# Patient Record
Sex: Female | Born: 1980 | Race: Black or African American | Hispanic: No | Smoking: Former smoker
Health system: Southern US, Community
[De-identification: ages and names within clinical notes are randomized; demographics above are authoritative.]

## PROBLEM LIST (undated history)

## (undated) DIAGNOSIS — H9190 Unspecified hearing loss, unspecified ear: Secondary | ICD-10-CM

## (undated) DIAGNOSIS — J45909 Unspecified asthma, uncomplicated: Secondary | ICD-10-CM

## (undated) DIAGNOSIS — E119 Type 2 diabetes mellitus without complications: Secondary | ICD-10-CM

## (undated) DIAGNOSIS — I10 Essential (primary) hypertension: Secondary | ICD-10-CM

---

## 2005-04-18 DIAGNOSIS — O1410 Severe pre-eclampsia, unspecified trimester: Secondary | ICD-10-CM

## 2005-04-18 DIAGNOSIS — O321XX0 Maternal care for breech presentation, not applicable or unspecified: Secondary | ICD-10-CM

## 2018-07-11 ENCOUNTER — Emergency Department (HOSPITAL_COMMUNITY): Payer: Medicaid Other

## 2018-07-11 ENCOUNTER — Emergency Department (HOSPITAL_COMMUNITY)
Admission: EM | Admit: 2018-07-11 | Discharge: 2018-07-11 | Disposition: A | Discharge status: Home / Self Care | Payer: Medicaid Other | Attending: Emergency Medicine | Admitting: Emergency Medicine

## 2018-07-11 ENCOUNTER — Other Ambulatory Visit: Payer: Self-pay

## 2018-07-11 ENCOUNTER — Encounter (HOSPITAL_COMMUNITY): Payer: Self-pay

## 2018-07-11 DIAGNOSIS — I10 Essential (primary) hypertension: Secondary | ICD-10-CM | POA: Insufficient documentation

## 2018-07-11 DIAGNOSIS — F17210 Nicotine dependence, cigarettes, uncomplicated: Secondary | ICD-10-CM | POA: Diagnosis not present

## 2018-07-11 DIAGNOSIS — N3001 Acute cystitis with hematuria: Secondary | ICD-10-CM | POA: Diagnosis not present

## 2018-07-11 DIAGNOSIS — F1290 Cannabis use, unspecified, uncomplicated: Secondary | ICD-10-CM | POA: Insufficient documentation

## 2018-07-11 DIAGNOSIS — R1030 Lower abdominal pain, unspecified: Secondary | ICD-10-CM | POA: Insufficient documentation

## 2018-07-11 DIAGNOSIS — M545 Low back pain: Secondary | ICD-10-CM | POA: Diagnosis present

## 2018-07-11 DIAGNOSIS — Z8709 Personal history of other diseases of the respiratory system: Secondary | ICD-10-CM | POA: Insufficient documentation

## 2018-07-11 DIAGNOSIS — E119 Type 2 diabetes mellitus without complications: Secondary | ICD-10-CM | POA: Diagnosis not present

## 2018-07-11 HISTORY — DX: Unspecified asthma, uncomplicated: J45.909

## 2018-07-11 HISTORY — DX: Type 2 diabetes mellitus without complications: E11.9

## 2018-07-11 HISTORY — DX: Essential (primary) hypertension: I10

## 2018-07-11 LAB — URINALYSIS, ROUTINE W REFLEX MICROSCOPIC
Bilirubin Urine: NEGATIVE
Glucose, UA: >=500 mg/dL — AB
Ketones, ur: 5 mg/dL — AB
Nitrite: NEGATIVE
Protein, ur: 100 mg/dL — AB
Specific Gravity, Urine: 1.024 (ref 1.005–1.030)
pH: 6 (ref 5.0–8.0)

## 2018-07-11 LAB — COMPREHENSIVE METABOLIC PANEL
ALT: 30 U/L (ref 0–44)
AST: 41 U/L (ref 15–41)
Albumin: 3.8 g/dL (ref 3.5–5.0)
Alkaline Phosphatase: 62 U/L (ref 38–126)
Anion gap: 8 (ref 5–15)
BUN: 12 mg/dL (ref 6–20)
CO2: 22 mmol/L (ref 22–32)
Calcium: 9.1 mg/dL (ref 8.9–10.3)
Chloride: 107 mmol/L (ref 98–111)
Creatinine, Ser: 0.72 mg/dL (ref 0.44–1.00)
GFR calc Af Amer: >60 mL/min (ref >60)
GFR calc non Af Amer: >60 mL/min (ref >60)
GLUCOSE: 192 mg/dL — AB (ref 70–99)
Potassium: 4.3 mmol/L (ref 3.5–5.1)
Sodium: 137 mmol/L (ref 135–145)
Total Bilirubin: 1.1 mg/dL (ref 0.3–1.2)
Total Protein: 7.6 g/dL (ref 6.5–8.1)

## 2018-07-11 LAB — CBC
HEMATOCRIT: 43.8 % (ref 36.0–46.0)
HEMOGLOBIN: 13.8 g/dL (ref 12.0–15.0)
MCH: 26.8 pg (ref 26.0–34.0)
MCHC: 31.5 g/dL (ref 30.0–36.0)
MCV: 85.2 fL (ref 80.0–100.0)
Platelets: 226 10*3/uL (ref 150–400)
RBC: 5.14 MIL/uL — AB (ref 3.87–5.11)
RDW: 13.3 % (ref 11.5–15.5)
WBC: 9.5 10*3/uL (ref 4.0–10.5)
nRBC: 0 % (ref 0.0–0.2)

## 2018-07-11 LAB — PREGNANCY, URINE: Preg Test, Ur: NEGATIVE

## 2018-07-11 MED ORDER — CEPHALEXIN 500 MG PO CAPS: 500.0000 mg | ORAL_CAPSULE | Freq: Two times a day (BID) | ORAL | 0 refills | Status: DC

## 2018-07-11 MED ORDER — ONDANSETRON 8 MG PO TBDP: 8.0000 mg | ORAL_TABLET | Freq: Three times a day (TID) | ORAL | 0 refills | Status: DC | PRN

## 2018-07-11 MED ORDER — OXYCODONE-ACETAMINOPHEN 5-325 MG PO TABS
1.0000 | ORAL_TABLET | Freq: Once | ORAL | Status: AC
  Administered 2018-07-11: 1 via ORAL
  Filled 2018-07-11: qty 1

## 2018-07-11 MED ORDER — MORPHINE SULFATE (PF) 4 MG/ML IV SOLN
4.0000 mg | Freq: Once | INTRAVENOUS | Status: AC
  Administered 2018-07-11: 4 mg via INTRAVENOUS
  Filled 2018-07-11: qty 1

## 2018-07-11 MED ORDER — IOHEXOL 300 MG/ML  SOLN
100.0000 mL | Freq: Once | INTRAMUSCULAR | Status: AC | PRN
  Administered 2018-07-11: 100 mL via INTRAVENOUS

## 2018-07-11 MED ORDER — SODIUM CHLORIDE 0.9 % IV SOLN
1.0000 g | Freq: Once | INTRAVENOUS | Status: AC
  Administered 2018-07-11: 1 g via INTRAVENOUS
  Filled 2018-07-11: qty 10

## 2018-07-11 NOTE — ED Triage Notes (Signed)
Pt presents with low back pain that radiates to the front abd, that started Monday, pt reports the pain as constant.   Pt reports dizzy spells and is unable to check her sugar due to lack of home glucometer.

## 2018-07-12 LAB — URINE CULTURE

## 2018-07-13 NOTE — ED Provider Notes (Signed)
[facility] EMERGENCY DEPARTMENT Provider Note   CSN: [id number] Arrival date & time: 07/11/18  7672    History   Chief Complaint Chief Complaint  Patient presents with  . Back Pain    HPI Jane Schultz is a 38 y.o. female.     HPI Patient is a 38 year old female who presents the emergency department complaints of back pain radiating around to the front of her abdomen.  No urinary complaints.  Denies nausea vomiting diarrhea.  History of diabetes and asthma and hypertension.  No recent sick contacts.  Denies upper abdominal pain.  No new rash.  Symptoms are constant and moderate in severity.     Past Medical History:  Diagnosis Date  . Asthma   . Diabetes mellitus without complication ([place])   . Hypertension     There are no active problems to display for this patient.   History reviewed. No pertinent surgical history.   OB History   No obstetric history on file.      Home Medications    Prior to Admission medications   Medication Sig Start Date End Date Taking? Authorizing Provider  cephALEXin (KEFLEX) 500 MG capsule Take 1 capsule (500 mg total) by mouth 2 (two) times daily. 07/11/18   Jola Schmidt, MD  ondansetron (ZOFRAN ODT) 8 MG disintegrating tablet Take 1 tablet (8 mg total) by mouth every 8 (eight) hours as needed for nausea or vomiting. 07/11/18   Jola Schmidt, MD    Family History No family history on file.  Social History Social History   Tobacco Use  . Smoking status: Current Every Day Smoker    Packs/day: 2.00    Types: Cigarettes  . Smokeless tobacco: Never Used  Substance Use Topics  . Alcohol use: Never    Frequency: Never  . Drug use: Yes    Types: Marijuana     Allergies   Patient has no known allergies.   Review of Systems Review of Systems  All other systems reviewed and are negative.    Physical Exam Updated Vital Signs BP (!) 150/92   Pulse 74   Temp 98.4 F (36.9 C) (Oral)    Resp 16   Ht 5\' 7"  (1.702 m)   Wt 113.4 kg   SpO2 100%   BMI 39.16 kg/m   Physical Exam Vitals signs and nursing note reviewed.  Constitutional:      General: She is not in acute distress.    Appearance: She is well-developed.  HENT:     Head: Normocephalic and atraumatic.  Neck:     Musculoskeletal: Normal range of motion.  Cardiovascular:     Rate and Rhythm: Normal rate and regular rhythm.     Heart sounds: Normal heart sounds.  Pulmonary:     Effort: Pulmonary effort is normal.     Breath sounds: Normal breath sounds.  Abdominal:     General: There is no distension.     Palpations: Abdomen is soft.     Tenderness: There is no abdominal tenderness.  Musculoskeletal: Normal range of motion.  Skin:    General: Skin is warm and dry.  Neurological:     Mental Status: She is alert and oriented to person, place, and time.  Psychiatric:        Judgment: Judgment normal.      ED Treatments / Results  Labs (all labs ordered are listed, but only abnormal results are displayed) Labs Reviewed  URINE CULTURE - Abnormal; Notable for the  following components:      Result Value        All other components within normal limits  CBC - Abnormal; Notable for the following components:   RBC 5.14 (*)    All other components within normal limits  COMPREHENSIVE METABOLIC PANEL - Abnormal; Notable for the following components:   Glucose, Bld 192 (*)    All other components within normal limits  URINALYSIS, ROUTINE W REFLEX MICROSCOPIC - Abnormal; Notable for the following components:   APPearance CLOUDY (*)    Glucose, UA >=500 (*)    Hgb urine dipstick SMALL (*)    Ketones, ur 5 (*)    Protein, ur 100 (*)    Leukocytes,Ua TRACE (*)    Bacteria, UA RARE (*)    All other components within normal limits  PREGNANCY, URINE    EKG None  Radiology  CT abdomen pelvis  iMPRESSION: 1. Mild urinary bladder wall thickening. Question a degree of cystitis. Correlation with  urinalysis in this regard advised.  2. Mild adrenal hypertrophy bilaterally, a finding of questionable clinical significance.  3. No evident renal or ureteral calculus. No hydronephrosis on either side.  4. No evident bowel obstruction. No abscess in the abdomen or pelvis. Appendix appears normal.  5.  Gallbladder absent.  Procedures Procedures (including critical care time)  Medications Ordered in ED Medications  morphine 4 MG/ML injection 4 mg (4 mg Intravenous Given 07/11/18 1049)  iohexol (OMNIPAQUE) 300 MG/ML solution 100 mL (100 mLs Intravenous Contrast Given 07/11/18 1204)  cefTRIAXone (ROCEPHIN) 1 g in sodium chloride 0.9 % 100 mL IVPB (0 g Intravenous Stopped 07/11/18 1350)  oxyCODONE-acetaminophen (PERCOCET/ROXICET) 5-325 MG per tablet 1 tablet (1 tablet Oral Given 07/11/18 1337)     Initial Impression / Assessment and Plan / ED Course  I have reviewed the triage vital signs and the nursing notes.  Pertinent labs & imaging results that were available during my care of the patient were reviewed by me and considered in my medical decision making (see chart for details).       Patient is overall well-appearing.  CT scan is reassuring.  No indication for additional work-up at this time.  Primary care follow-up.    Final Clinical Impressions(s) / ED Diagnoses   Final diagnoses:  Acute cystitis with hematuria    ED Discharge Orders         Ordered    cephALEXin (KEFLEX) 500 MG capsule  2 times daily     07/11/18 1403    ondansetron (ZOFRAN ODT) 8 MG disintegrating tablet  Every 8 hours PRN     07/11/18 1403           Jola Schmidt, MD 07/13/18 2305

## 2019-01-15 ENCOUNTER — Other Ambulatory Visit: Payer: Self-pay

## 2019-01-15 ENCOUNTER — Encounter: Payer: Self-pay | Admitting: Emergency Medicine

## 2019-01-15 DIAGNOSIS — R102 Pelvic and perineal pain: Secondary | ICD-10-CM | POA: Diagnosis not present

## 2019-01-15 DIAGNOSIS — D259 Leiomyoma of uterus, unspecified: Secondary | ICD-10-CM | POA: Diagnosis not present

## 2019-01-15 DIAGNOSIS — I10 Essential (primary) hypertension: Secondary | ICD-10-CM | POA: Insufficient documentation

## 2019-01-15 DIAGNOSIS — N83201 Unspecified ovarian cyst, right side: Secondary | ICD-10-CM | POA: Insufficient documentation

## 2019-01-15 DIAGNOSIS — N760 Acute vaginitis: Secondary | ICD-10-CM | POA: Diagnosis not present

## 2019-01-15 DIAGNOSIS — Y929 Unspecified place or not applicable: Secondary | ICD-10-CM | POA: Insufficient documentation

## 2019-01-15 DIAGNOSIS — F1210 Cannabis abuse, uncomplicated: Secondary | ICD-10-CM | POA: Diagnosis not present

## 2019-01-15 DIAGNOSIS — S93401A Sprain of unspecified ligament of right ankle, initial encounter: Secondary | ICD-10-CM | POA: Insufficient documentation

## 2019-01-15 DIAGNOSIS — F17210 Nicotine dependence, cigarettes, uncomplicated: Secondary | ICD-10-CM | POA: Diagnosis not present

## 2019-01-15 DIAGNOSIS — J45909 Unspecified asthma, uncomplicated: Secondary | ICD-10-CM | POA: Diagnosis not present

## 2019-01-15 DIAGNOSIS — Y33XXXA Other specified events, undetermined intent, initial encounter: Secondary | ICD-10-CM | POA: Diagnosis not present

## 2019-01-15 DIAGNOSIS — Y939 Activity, unspecified: Secondary | ICD-10-CM | POA: Insufficient documentation

## 2019-01-15 DIAGNOSIS — E119 Type 2 diabetes mellitus without complications: Secondary | ICD-10-CM | POA: Diagnosis not present

## 2019-01-15 DIAGNOSIS — Z79899 Other long term (current) drug therapy: Secondary | ICD-10-CM | POA: Diagnosis not present

## 2019-01-15 DIAGNOSIS — Y999 Unspecified external cause status: Secondary | ICD-10-CM | POA: Insufficient documentation

## 2019-01-15 DIAGNOSIS — B9689 Other specified bacterial agents as the cause of diseases classified elsewhere: Secondary | ICD-10-CM | POA: Insufficient documentation

## 2019-01-15 LAB — URINALYSIS, COMPLETE (UACMP) WITH MICROSCOPIC
Bilirubin Urine: NEGATIVE
Glucose, UA: >=500 mg/dL — AB
Hgb urine dipstick: NEGATIVE
Ketones, ur: 5 mg/dL — AB
Leukocytes,Ua: NEGATIVE
Nitrite: NEGATIVE
Protein, ur: 30 mg/dL — AB
Specific Gravity, Urine: 1.035 — ABNORMAL HIGH (ref 1.005–1.030)
pH: 5 (ref 5.0–8.0)

## 2019-01-15 LAB — CBC
HCT: 40.5 % (ref 36.0–46.0)
Hemoglobin: 13.6 g/dL (ref 12.0–15.0)
MCH: 27.3 pg (ref 26.0–34.0)
MCHC: 33.6 g/dL (ref 30.0–36.0)
MCV: 81.2 fL (ref 80.0–100.0)
Platelets: 248 10*3/uL (ref 150–400)
RBC: 4.99 MIL/uL (ref 3.87–5.11)
RDW: 14.7 % (ref 11.5–15.5)
WBC: 10.4 10*3/uL (ref 4.0–10.5)
nRBC: 0 % (ref 0.0–0.2)

## 2019-01-15 LAB — POCT PREGNANCY, URINE: Preg Test, Ur: NEGATIVE

## 2019-01-15 MED ORDER — ONDANSETRON HCL 4 MG/2ML IJ SOLN
4.0000 mg | Freq: Once | INTRAMUSCULAR | Status: AC | PRN
  Administered 2019-01-15: 22:00:00 4 mg via INTRAVENOUS
  Filled 2019-01-15: qty 2

## 2019-01-15 MED ORDER — FENTANYL CITRATE (PF) 100 MCG/2ML IJ SOLN
50.0000 ug | INTRAMUSCULAR | Status: AC | PRN
  Administered 2019-01-15 – 2019-01-16 (×2): 50 ug via INTRAVENOUS
  Filled 2019-01-15 (×2): qty 2

## 2019-01-15 NOTE — ED Triage Notes (Addendum)
Patient to ER for c/o bilateral pelvic pain that radiates to back x2-3 hours. Denies any dysuria or vaginal discharge. Patient does report foul odor to urine, as well as abscess to labia.  Patient also c/o right thigh pain and swelling.

## 2019-01-16 ENCOUNTER — Emergency Department: Payer: Medicaid Other

## 2019-01-16 ENCOUNTER — Emergency Department
Admission: EM | Admit: 2019-01-16 | Discharge: 2019-01-16 | Disposition: A | Discharge status: Home / Self Care | Payer: Medicaid Other | Attending: Emergency Medicine | Admitting: Emergency Medicine

## 2019-01-16 DIAGNOSIS — S93401A Sprain of unspecified ligament of right ankle, initial encounter: Secondary | ICD-10-CM

## 2019-01-16 DIAGNOSIS — R739 Hyperglycemia, unspecified: Secondary | ICD-10-CM

## 2019-01-16 DIAGNOSIS — R102 Pelvic and perineal pain: Secondary | ICD-10-CM

## 2019-01-16 DIAGNOSIS — N83201 Unspecified ovarian cyst, right side: Secondary | ICD-10-CM

## 2019-01-16 DIAGNOSIS — B9689 Other specified bacterial agents as the cause of diseases classified elsewhere: Secondary | ICD-10-CM

## 2019-01-16 DIAGNOSIS — N760 Acute vaginitis: Secondary | ICD-10-CM

## 2019-01-16 DIAGNOSIS — R52 Pain, unspecified: Secondary | ICD-10-CM

## 2019-01-16 DIAGNOSIS — D259 Leiomyoma of uterus, unspecified: Secondary | ICD-10-CM

## 2019-01-16 LAB — LIPASE, BLOOD: Lipase: 26 U/L (ref 11–51)

## 2019-01-16 LAB — COMPREHENSIVE METABOLIC PANEL
ALT: 29 U/L (ref 0–44)
AST: 22 U/L (ref 15–41)
Albumin: 3.7 g/dL (ref 3.5–5.0)
Alkaline Phosphatase: 67 U/L (ref 38–126)
Anion gap: 11 (ref 5–15)
BUN: 9 mg/dL (ref 6–20)
CO2: 24 mmol/L (ref 22–32)
Calcium: 8.5 mg/dL — ABNORMAL LOW (ref 8.9–10.3)
Chloride: 100 mmol/L (ref 98–111)
Creatinine, Ser: 0.66 mg/dL (ref 0.44–1.00)
GFR calc Af Amer: >60 mL/min (ref >60)
GFR calc non Af Amer: >60 mL/min (ref >60)
Glucose, Bld: 265 mg/dL — ABNORMAL HIGH (ref 70–99)
Potassium: 4.2 mmol/L (ref 3.5–5.1)
Sodium: 135 mmol/L (ref 135–145)
Total Bilirubin: 0.7 mg/dL (ref 0.3–1.2)
Total Protein: 7.3 g/dL (ref 6.5–8.1)

## 2019-01-16 LAB — WET PREP, GENITAL
Sperm: NONE SEEN
Trich, Wet Prep: NONE SEEN
Yeast Wet Prep HPF POC: NONE SEEN

## 2019-01-16 MED ORDER — CLINDAMYCIN HCL 300 MG PO CAPS: 300.0000 mg | ORAL_CAPSULE | Freq: Three times a day (TID) | ORAL | 0 refills | Status: DC

## 2019-01-16 MED ORDER — FENTANYL CITRATE (PF) 100 MCG/2ML IJ SOLN
50.0000 ug | Freq: Once | INTRAMUSCULAR | Status: AC
  Administered 2019-01-16: 01:00:00 50 ug via INTRAVENOUS
  Filled 2019-01-16: qty 2

## 2019-01-16 MED ORDER — METRONIDAZOLE 500 MG PO TABS: 500.0000 mg | ORAL_TABLET | Freq: Two times a day (BID) | ORAL | 0 refills | Status: DC

## 2019-01-16 MED ORDER — METRONIDAZOLE 500 MG PO TABS
500.0000 mg | ORAL_TABLET | Freq: Once | ORAL | Status: AC
  Administered 2019-01-16: 05:00:00 500 mg via ORAL
  Filled 2019-01-16: qty 1

## 2019-01-16 MED ORDER — HYDROCODONE-ACETAMINOPHEN 5-325 MG PO TABS: 1.0000 | ORAL_TABLET | Freq: Four times a day (QID) | ORAL | 0 refills | Status: DC | PRN

## 2019-01-16 MED ORDER — CLINDAMYCIN HCL 150 MG PO CAPS
300.0000 mg | ORAL_CAPSULE | Freq: Once | ORAL | Status: AC
  Administered 2019-01-16: 300 mg via ORAL
  Filled 2019-01-16: qty 2

## 2019-01-16 MED ORDER — ONDANSETRON HCL 4 MG/2ML IJ SOLN
4.0000 mg | Freq: Once | INTRAMUSCULAR | Status: AC
  Administered 2019-01-16: 01:00:00 4 mg via INTRAVENOUS
  Filled 2019-01-16: qty 2

## 2019-01-16 NOTE — ED Provider Notes (Signed)
[facility] Emergency Department Provider Note   ____________________________________________   First MD Initiated Contact with Patient 01/16/19 0012     (approximate)  I have reviewed the triage vital signs and the nursing notes.   HISTORY  Chief Complaint Pelvic Pain  History obtained via [employer] ASL interpreter  HPI Jane Schultz is a 38 y.o. female brought to the ED from home via EMS with a chief complaint of pelvic pain.  Patient reports bilateral pelvic pain radiating to her back for the past 2 to 3 hours prior to arrival.  Denies fever, cough, chest pain, shortness of breath, nausea, vomiting, dysuria, vaginal discharge.  Reports painful knot to her right labia.  Also notes right foot pain and swelling.  Fell 2 days ago.       Past Medical History:  Diagnosis Date   Asthma    Diabetes mellitus without complication ([place])    Hypertension     There are no active problems to display for this patient.   Past Surgical History:  Procedure Laterality Date   CESAREAN SECTION      Prior to Admission medications   Medication Sig Start Date End Date Taking? Authorizing Provider  cephALEXin (KEFLEX) 500 MG capsule Take 1 capsule (500 mg total) by mouth 2 (two) times daily. 07/11/18   Jola Schmidt, MD  clindamycin (CLEOCIN) 300 MG capsule Take 1 capsule (300 mg total) by mouth 3 (three) times daily. 01/16/19   Paulette Blanch, MD  HYDROcodone-acetaminophen (NORCO) 5-325 MG tablet Take 1 tablet by mouth every 6 (six) hours as needed for moderate pain. 01/16/19   Paulette Blanch, MD  metroNIDAZOLE (FLAGYL) 500 MG tablet Take 1 tablet (500 mg total) by mouth 2 (two) times daily. 01/16/19   Paulette Blanch, MD  ondansetron (ZOFRAN ODT) 8 MG disintegrating tablet Take 1 tablet (8 mg total) by mouth every 8 (eight) hours as needed for nausea or vomiting. 07/11/18   Jola Schmidt, MD    Allergies Patient has no known allergies.  No family history on  file.  Social History Social History   Tobacco Use   Smoking status: Current Every Day Smoker    Packs/day: 2.00    Types: Cigarettes   Smokeless tobacco: Never Used  Substance Use Topics   Alcohol use: Never    Frequency: Never   Drug use: Yes    Types: Marijuana    Review of Systems  Constitutional: No fever/chills Eyes: No visual changes. ENT: No sore throat. Cardiovascular: Denies chest pain. Respiratory: Denies shortness of breath. Gastrointestinal: Positive for pelvic pain.  No abdominal pain.  No nausea, no vomiting.  No diarrhea.  No constipation. Genitourinary: Positive for dysuria. Musculoskeletal: Negative for back pain. Skin: Negative for rash. Neurological: Negative for headaches, focal weakness or numbness.   ____________________________________________   PHYSICAL EXAM:  VITAL SIGNS: ED Triage Vitals  Enc Vitals Group     BP 01/15/19 2229 (!) 120/59     Pulse Rate 01/15/19 2229 87     Resp 01/15/19 2229 20     Temp 01/15/19 2229 98.8 F (37.1 C)     Temp Source 01/15/19 2229 Oral     SpO2 01/15/19 2229 97 %     Weight 01/15/19 2153 260 lb (117.9 kg)     Height 01/15/19 2153 5\' 7"  (1.702 m)     Head Circumference --      Peak Flow --      Pain Score 01/15/19 2152 10  Pain Loc --      Pain Edu? --      Excl. in [place]? --     Constitutional: Alert and oriented. Well appearing and in no acute distress. Eyes: Conjunctivae are normal. PERRL. EOMI. Head: Atraumatic. Nose: No congestion/rhinnorhea. Mouth/Throat: Mucous membranes are moist.  Oropharynx non-erythematous. Neck: No stridor.   Cardiovascular: Normal rate, regular rhythm. Grossly normal heart sounds.  Good peripheral circulation. Respiratory: Normal respiratory effort.  No retractions. Lungs CTAB. Gastrointestinal: Soft and minimally tender to palpation pelvis without rebound or guarding. No distention. No abdominal bruits. No CVA tenderness. Genitourinary: Tiny painful area to  right outer labia without erythema or fluctuance. Musculoskeletal: No lower extremity tenderness nor edema.  No joint effusions. Neurologic:  Normal speech and language. No gross focal neurologic deficits are appreciated. No gait instability. Skin:  Skin is warm, dry and intact. No rash noted. Psychiatric: Mood and affect are normal. Speech and behavior are normal.  ____________________________________________   LABS (all labs ordered are listed, but only abnormal results are displayed)  Labs Reviewed  WET PREP, GENITAL - Abnormal; Notable for the following components:      Result Value   Clue Cells Wet Prep HPF POC FEW (*)    WBC, Wet Prep HPF POC FEW (*)    All other components within normal limits  URINALYSIS, COMPLETE (UACMP) WITH MICROSCOPIC - Abnormal; Notable for the following components:   Color, Urine YELLOW (*)    APPearance CLEAR (*)    Specific Gravity, Urine 1.035 (*)    Glucose, UA >=500 (*)    Ketones, ur 5 (*)    Protein, ur 30 (*)    Bacteria, UA RARE (*)    All other components within normal limits  COMPREHENSIVE METABOLIC PANEL - Abnormal; Notable for the following components:   Glucose, Bld 265 (*)    Calcium 8.5 (*)    All other components within normal limits  GC/CHLAMYDIA PROBE AMP  CBC  LIPASE, BLOOD  POC URINE PREG, ED  POCT PREGNANCY, URINE   ____________________________________________  EKG  None ____________________________________________  RADIOLOGY  ED MD interpretation: No osseous injury to right foot; right ovarian cyst, uterine fibroid  Official radiology report(s): Dg Foot Complete Right  Result Date: 01/16/2019 CLINICAL DATA:  Right foot pain. Swelling. EXAM: RIGHT FOOT COMPLETE - 3+ VIEW COMPARISON:  None. FINDINGS: There is no evidence of fracture or dislocation. Well corticated osseous density at the first tarsal metatarsal joint is likely an accessory ossicle or result of remote prior injury. No osseous erosions or bony  destructive change. Tiny Achilles tendon enthesophyte. Mild tibial talar spurring. Generalized soft tissue edema overlies the dorsum of the foot. IMPRESSION: 1. Soft tissue edema over the dorsum of the foot. No acute osseous abnormality. 2. Well corticated osseous density at the first tarsometatarsal joint is likely an accessory ossicle or result of remote injury. Electronically Signed   By: Keith Rake M.D.   On: 01/16/2019 00:59   US Pelvic Complete W Transvaginal And Torsion R/o  Result Date: 01/16/2019 CLINICAL DATA:  Bilateral pelvic pain with radiation to the patient is back for several days, negative UPT, irregular menses EXAM: TRANSABDOMINAL AND TRANSVAGINAL ULTRASOUND OF PELVIS DOPPLER ULTRASOUND OF OVARIES TECHNIQUE: Both transabdominal and transvaginal ultrasound examinations of the pelvis were performed. Transabdominal technique was performed for global imaging of the pelvis including uterus, ovaries, adnexal regions, and pelvic cul-de-sac. It was necessary to proceed with endovaginal exam following the transabdominal exam to visualize the endometrium and  ovaries. Color and duplex Doppler ultrasound was utilized to evaluate blood flow to the ovaries. COMPARISON:  CT abdomen pelvis 07/11/2018 FINDINGS: Uterus Measurements: 8.1 x 5.0 x 5.1 cm = volume: 106.8 mL. There is a hypoechoic fibroid present along the posterior uterine fundus measuring 2.1 x 1.6 x 1.9 cm. No other significant uterine lesions or abnormality is visualized. Endometrium Thickness: 2.5 mm, within normal limits. No focal abnormality visualized. Right ovary Measurements: 4.7 x 3.1 x 3.4 cm = volume: 25.5 mL. Normal appearance of the right ovarian stroma. There is a large anechoic cyst measuring 4.3 x 2.7 x 3.6 cm in size. Background Left ovary Measurements: 2.7 x 1.8 x 2.0 cm = volume: 5 mL. Normal appearance. No adnexal mass. Pulsed Doppler evaluation of both ovaries demonstrates normal low-resistance arterial and venous  waveforms. Other findings Trace anechoic free fluid noted in the pelvis. IMPRESSION: 4.3 cm anechoic right ovarian cyst. This has benign characteristics and is a common finding in premenopausal females. No imaging follow up is required. This follows consensus guidelines: Simple Adnexal Cysts: SRU Consensus Conference Update on Follow-up and Reporting. Radiology 2019; [id number]. 2.1 cm hypoechoic posterior fundal fibroid. Otherwise unremarkable pelvic ultrasound. Electronically Signed   By: Lovena Le M.D.   On: 01/16/2019 02:48    ____________________________________________   PROCEDURES  Procedure(s) performed (including Critical Care):  Procedures   ____________________________________________   INITIAL IMPRESSION / ASSESSMENT AND PLAN / ED COURSE  As part of my medical decision making, I reviewed the following data within the [place] notes reviewed and incorporated, Interpreter needed, Labs reviewed, Old chart reviewed, Radiograph reviewed, Notes from prior ED visits and [place] Controlled Substance Database     Jane Schultz was evaluated in Emergency Department on 01/16/2019 for the symptoms described in the history of present illness. She was evaluated in the context of the global COVID-19 pandemic, which necessitated consideration that the patient might be at risk for infection with the SARS-CoV-2 virus that causes COVID-19. Institutional protocols and algorithms that pertain to the evaluation of patients at risk for COVID-19 are in a state of rapid change based on information released by regulatory bodies including the CDC and federal and state organizations. These policies and algorithms were followed during the patient's care in the ED.    38 year old female who presents with pelvic pain and painful knot to right labia. Differential diagnosis includes, but is not limited to, ovarian cyst, ovarian torsion, acute appendicitis, diverticulitis,  urinary tract infection/pyelonephritis, endometriosis, bowel obstruction, colitis, renal colic, gastroenteritis, hernia, fibroids, endometriosis, pregnancy related pain including ectopic pregnancy, etc.  Patient is self swab for wet prep/DNA.  Will obtain pelvic ultrasound.  Likely developing abscess to right labia which is currently too small for I&D.  Keep patient comfortable with analgesia and reassess.   Clinical Course as of Jan 16 628  Wed Jan 16, 2019  0406 Updated patient of all test results.  Will place on antibiotics for both BV as well as probable developing labial abscess and patient will follow-up with GYN.  Strict return precautions given.  Patient verbalizes understanding and agrees with plan of care.   [JS]    Clinical Course User Index [JS] Paulette Blanch, MD     ____________________________________________   FINAL CLINICAL IMPRESSION(S) / ED DIAGNOSES  Final diagnoses:  Pain  Pelvic pain in female  Cyst of right ovary  Uterine leiomyoma, unspecified location  BV (bacterial vaginosis)  Sprain of right ankle, unspecified ligament, initial encounter  Hyperglycemia  ED Discharge Orders         Ordered    metroNIDAZOLE (FLAGYL) 500 MG tablet  2 times daily     01/16/19 0414    clindamycin (CLEOCIN) 300 MG capsule  3 times daily     01/16/19 0414    HYDROcodone-acetaminophen (NORCO) 5-325 MG tablet  Every 6 hours PRN     01/16/19 0415           Note:  This document was prepared using Dragon voice recognition software and may include unintentional dictation errors.   Paulette Blanch, MD 01/16/19 [phone]

## 2019-01-16 NOTE — ED Notes (Signed)
Patient transported to [facility] 

## 2019-01-16 NOTE — Discharge Instructions (Addendum)
1.  You may take Norco as needed for pain. 2.  Take antibiotics as prescribed: Flagyl 500 mg twice daily x7 days Clindamycin 300 mg 3 times daily x10 days 3.  Wear elastic bandage as needed for comfort. 4.  Return to the ER for worsening symptoms, persistent vomiting, difficulty breathing or other concerns.

## 2019-01-16 NOTE — ED Notes (Signed)
ED Provider at bedside with this writer. Patient having lower abdominal pain that has been going on for a few days. Pt states having foul smelling urine. Pt denies vaginal bleeding. Patient states having boil around vaginal area.

## 2019-01-16 NOTE — ED Notes (Signed)
ED Provider at bedside. 

## 2019-05-12 ENCOUNTER — Encounter (HOSPITAL_COMMUNITY): Payer: Self-pay

## 2019-05-12 ENCOUNTER — Emergency Department (HOSPITAL_COMMUNITY): Payer: Medicaid Other

## 2019-05-12 ENCOUNTER — Emergency Department (HOSPITAL_COMMUNITY)
Admission: EM | Admit: 2019-05-12 | Discharge: 2019-05-13 | Disposition: A | Discharge status: Home / Self Care | Payer: Medicaid Other | Attending: Emergency Medicine | Admitting: Emergency Medicine

## 2019-05-12 DIAGNOSIS — I10 Essential (primary) hypertension: Secondary | ICD-10-CM | POA: Insufficient documentation

## 2019-05-12 DIAGNOSIS — R42 Dizziness and giddiness: Secondary | ICD-10-CM | POA: Insufficient documentation

## 2019-05-12 DIAGNOSIS — E119 Type 2 diabetes mellitus without complications: Secondary | ICD-10-CM | POA: Diagnosis not present

## 2019-05-12 DIAGNOSIS — Z20822 Contact with and (suspected) exposure to covid-19: Secondary | ICD-10-CM | POA: Diagnosis not present

## 2019-05-12 DIAGNOSIS — Z794 Long term (current) use of insulin: Secondary | ICD-10-CM | POA: Diagnosis not present

## 2019-05-12 DIAGNOSIS — Z79899 Other long term (current) drug therapy: Secondary | ICD-10-CM | POA: Insufficient documentation

## 2019-05-12 DIAGNOSIS — M79604 Pain in right leg: Secondary | ICD-10-CM | POA: Insufficient documentation

## 2019-05-12 DIAGNOSIS — R0602 Shortness of breath: Secondary | ICD-10-CM | POA: Insufficient documentation

## 2019-05-12 DIAGNOSIS — Z7982 Long term (current) use of aspirin: Secondary | ICD-10-CM | POA: Diagnosis not present

## 2019-05-12 DIAGNOSIS — F17210 Nicotine dependence, cigarettes, uncomplicated: Secondary | ICD-10-CM | POA: Insufficient documentation

## 2019-05-12 DIAGNOSIS — R2243 Localized swelling, mass and lump, lower limb, bilateral: Secondary | ICD-10-CM | POA: Diagnosis present

## 2019-05-12 DIAGNOSIS — F1210 Cannabis abuse, uncomplicated: Secondary | ICD-10-CM | POA: Insufficient documentation

## 2019-05-12 DIAGNOSIS — M79605 Pain in left leg: Secondary | ICD-10-CM | POA: Diagnosis not present

## 2019-05-12 LAB — I-STAT BETA HCG BLOOD, ED (MC, WL, AP ONLY): I-stat hCG, quantitative: <5 m[IU]/mL (ref <5)

## 2019-05-12 LAB — CBC WITH DIFFERENTIAL/PLATELET
Abs Immature Granulocytes: 0.03 10*3/uL (ref 0.00–0.07)
Basophils Absolute: 0.1 10*3/uL (ref 0.0–0.1)
Basophils Relative: 0 %
Eosinophils Absolute: 0.3 10*3/uL (ref 0.0–0.5)
Eosinophils Relative: 3 %
HCT: 44.7 % (ref 36.0–46.0)
Hemoglobin: 14.8 g/dL (ref 12.0–15.0)
Immature Granulocytes: 0 %
Lymphocytes Relative: 28 %
Lymphs Abs: 3.1 10*3/uL (ref 0.7–4.0)
MCH: 27.8 pg (ref 26.0–34.0)
MCHC: 33.1 g/dL (ref 30.0–36.0)
MCV: 84 fL (ref 80.0–100.0)
Monocytes Absolute: 0.6 10*3/uL (ref 0.1–1.0)
Monocytes Relative: 5 %
Neutro Abs: 7.1 10*3/uL (ref 1.7–7.7)
Neutrophils Relative %: 64 %
Platelets: 228 10*3/uL (ref 150–400)
RBC: 5.32 MIL/uL — ABNORMAL HIGH (ref 3.87–5.11)
RDW: 13.6 % (ref 11.5–15.5)
WBC: 11.1 10*3/uL — ABNORMAL HIGH (ref 4.0–10.5)
nRBC: 0 % (ref 0.0–0.2)

## 2019-05-12 LAB — CBG MONITORING, ED
Glucose-Capillary: 259 mg/dL — ABNORMAL HIGH (ref 70–99)
Glucose-Capillary: 241 mg/dL — ABNORMAL HIGH (ref 70–99)

## 2019-05-12 LAB — BASIC METABOLIC PANEL
Anion gap: 8 (ref 5–15)
BUN: 9 mg/dL (ref 6–20)
CO2: 26 mmol/L (ref 22–32)
Calcium: 8.8 mg/dL — ABNORMAL LOW (ref 8.9–10.3)
Chloride: 100 mmol/L (ref 98–111)
Creatinine, Ser: 0.68 mg/dL (ref 0.44–1.00)
GFR calc Af Amer: >60 mL/min (ref >60)
GFR calc non Af Amer: >60 mL/min (ref >60)
Glucose, Bld: 245 mg/dL — ABNORMAL HIGH (ref 70–99)
Potassium: 4.1 mmol/L (ref 3.5–5.1)
Sodium: 134 mmol/L — ABNORMAL LOW (ref 135–145)

## 2019-05-12 LAB — HEPATIC FUNCTION PANEL
ALT: 21 U/L (ref 0–44)
AST: 28 U/L (ref 15–41)
Albumin: 3.8 g/dL (ref 3.5–5.0)
Alkaline Phosphatase: 75 U/L (ref 38–126)
Bilirubin, Direct: 0.3 mg/dL — ABNORMAL HIGH (ref 0.0–0.2)
Indirect Bilirubin: 0.5 mg/dL (ref 0.3–0.9)
Total Bilirubin: 0.8 mg/dL (ref 0.3–1.2)
Total Protein: 7.6 g/dL (ref 6.5–8.1)

## 2019-05-12 LAB — POC SARS CORONAVIRUS 2 AG -  ED: SARS Coronavirus 2 Ag: NEGATIVE

## 2019-05-12 LAB — BRAIN NATRIURETIC PEPTIDE: B Natriuretic Peptide: 37.1 pg/mL (ref 0.0–100.0)

## 2019-05-12 MED ORDER — INSULIN ASPART 100 UNIT/ML ~~LOC~~ SOLN: 5.0000 [IU] | Freq: Once | SUBCUTANEOUS | Status: DC

## 2019-05-12 MED ORDER — SODIUM CHLORIDE 0.9 % IV BOLUS
1000.0000 mL | Freq: Once | INTRAVENOUS | Status: AC
  Administered 2019-05-12: 1000 mL via INTRAVENOUS

## 2019-05-12 MED ORDER — INSULIN ASPART 100 UNIT/ML ~~LOC~~ SOLN: 8.0000 [IU] | Freq: Once | SUBCUTANEOUS | Status: DC

## 2019-05-12 NOTE — ED Triage Notes (Signed)
Pt arrives POV for eval of blt feet/leg swelling x 3 days. Pt reports she also feels like her blood sugar is high. Pt is type II diabetic, has not checked sugar since October d/t lack of supplies. Pt reports she takes daily metformin. Reports near fall in kitchen today w/ husband d/t dizziness and weakness. She feels like her sugar is high.

## 2019-05-12 NOTE — ED Notes (Signed)
Called and requested a sign lang. Int for the pt, they will call back once they have spoken to someone

## 2019-05-12 NOTE — ED Provider Notes (Signed)
[facility] EMERGENCY DEPARTMENT Provider Note   CSN: [id number] Arrival date & time: 05/12/19  2036     History Chief Complaint  Patient presents with  . Foot Swelling  . Blood Sugar Problem    Jane Schultz is a 39 y.o. female with PMHx HTN, diabetes, and asthma who presents to the ED today with multiple complaints.   Patient is currently complaining of gradual onset, constant, bilateral leg swelling and pain for the past 3 to 4 days.  She states that the pain started about 3 weeks ago but is gradually been getting worse.  She reports that it is hard for her to walk due to the pain.  No recent injury.  States she has to hold onto objects to help her walk.  Patient is not currently on fluid pills; does however states that she takes a blood pressure medication however she cannot recall the name of it.   Reports that she has been short of breath as well.  She does report that she thinks her blood sugars are high.  She states that she does not have a glucometer at home to check her sugars however she states she takes daily Metformin as well as insulin injections including Lantus and another medication she does not know the name of.  Patient states that she has been giving herself injections despite not checking her blood sugar readings.  Reports that she moved to the area in August 2020 and has not establish care with a primary care provider.   No recent prolonged travel or immobilization.  No history of DVT/PE.  No exogenous hormone use.  No active malignancy.  No hemoptysis.  Denies chest pain, abdominal pain, nausea vomiting, diarrhea, constipation, back pain, urinary difficulties, saddle anesthesia, headaches, vision changes, any other associated symptoms.   The history is provided by the patient. The history is limited by a language barrier. A language interpreter was used (ASL).       Past Medical History:  Diagnosis Date  . Asthma   . Diabetes  mellitus without complication ([place])   . Hypertension     There are no problems to display for this patient.   Past Surgical History:  Procedure Laterality Date  . CESAREAN SECTION       OB History   No obstetric history on file.     History reviewed. No pertinent family history.  Social History   Tobacco Use  . Smoking status: Current Every Day Smoker    Packs/day: 2.00    Types: Cigarettes  . Smokeless tobacco: Never Used  Substance Use Topics  . Alcohol use: Never  . Drug use: Yes    Types: Marijuana    Home Medications Prior to Admission medications   Medication Sig Start Date End Date Taking? Authorizing Provider  aspirin EC 81 MG tablet Take 81 mg by mouth daily.   Yes [provider]  insulin glargine (LANTUS) 100 UNIT/ML injection Inject 10-60 Units into the skin See admin instructions. Use 10 units every morning and use 60 units at bedtime   Yes [provider]  insulin lispro (HUMALOG) 100 UNIT/ML injection Inject 20 Units into the skin daily with lunch.   Yes [provider]  LISINOPRIL PO Take 1 tablet by mouth daily.   Yes [provider]  METFORMIN HCL PO Take 2 tablets by mouth daily.   Yes [provider]  cephALEXin (KEFLEX) 500 MG capsule Take 1 capsule (500 mg total) by mouth  2 (two) times daily. Patient not taking: Reported on 05/12/2019 07/11/18   Jola Schmidt, MD  clindamycin (CLEOCIN) 300 MG capsule Take 1 capsule (300 mg total) by mouth 3 (three) times daily. Patient not taking: Reported on 05/12/2019 01/16/19   Paulette Blanch, MD  HYDROcodone-acetaminophen [facility]) 5-325 MG tablet Take 1 tablet by mouth every 6 (six) hours as needed for moderate pain. Patient not taking: Reported on 05/12/2019 01/16/19   Paulette Blanch, MD  metroNIDAZOLE (FLAGYL) 500 MG tablet Take 1 tablet (500 mg total) by mouth 2 (two) times daily. Patient not taking: Reported on 05/12/2019 01/16/19   Paulette Blanch, MD  ondansetron (ZOFRAN ODT)  8 MG disintegrating tablet Take 1 tablet (8 mg total) by mouth every 8 (eight) hours as needed for nausea or vomiting. Patient not taking: Reported on 05/12/2019 07/11/18   Jola Schmidt, MD    Allergies    Patient has no known allergies.  Review of Systems   Review of Systems  Constitutional: Negative for chills and fever.  Respiratory: Positive for shortness of breath. Negative for cough.   Cardiovascular: Positive for leg swelling. Negative for chest pain.  Gastrointestinal: Negative for abdominal pain, constipation, diarrhea, nausea and vomiting.  Genitourinary: Negative for difficulty urinating.  Musculoskeletal: Positive for arthralgias.  Neurological: Positive for dizziness. Negative for weakness, numbness and headaches.  All other systems reviewed and are negative.   Physical Exam Updated Vital Signs BP 104/81 (BP Location: Right Arm)   Pulse 80   Temp 98.6 F (37 C) (Oral)   Resp (!) 23   Ht 5\' 7"  (1.702 m)   Wt 113.4 kg   SpO2 99%   BMI 39.16 kg/m   Physical Exam Vitals and nursing note reviewed.  Constitutional:      Appearance: She is not ill-appearing or diaphoretic.  HENT:     Head: Normocephalic and atraumatic.  Eyes:     Conjunctiva/sclera: Conjunctivae normal.  Cardiovascular:     Rate and Rhythm: Normal rate and regular rhythm.     Pulses: Normal pulses.  Pulmonary:     Effort: Pulmonary effort is normal.     Breath sounds: Normal breath sounds. No wheezing, rhonchi or rales.  Abdominal:     Palpations: Abdomen is soft.     Tenderness: There is no abdominal tenderness. There is no guarding or rebound.  Musculoskeletal:        General: Tenderness present.     Cervical back: Neck supple.     Right lower leg: Edema present.     Left lower leg: Edema present.     Comments: Nonpitting edema bilateral lower extremities. Diffuse tenderness throughout BLEs with palpation. Sensation intact throughout. Pt unable to lift legs due to pain. 2+ Dp pulses.     Skin:    General: Skin is warm and dry.  Neurological:     Mental Status: She is alert.     ED Results / Procedures / Treatments   Labs (all labs ordered are listed, but only abnormal results are displayed) Labs Reviewed  BASIC METABOLIC PANEL - Abnormal; Notable for the following components:      Result Value   Sodium 134 (*)    Glucose, Bld 245 (*)    Calcium 8.8 (*)    All other components within normal limits  CBC WITH DIFFERENTIAL/PLATELET - Abnormal; Notable for the following components:   WBC 11.1 (*)    RBC 5.32 (*)    All other components within normal limits  HEPATIC FUNCTION PANEL - Abnormal; Notable for the following components:   Bilirubin, Direct 0.3 (*)    All other components within normal limits  CBG MONITORING, ED - Abnormal; Notable for the following components:   Glucose-Capillary 259 (*)    All other components within normal limits  CBG MONITORING, ED - Abnormal; Notable for the following components:   Glucose-Capillary 241 (*)    All other components within normal limits  BRAIN NATRIURETIC PEPTIDE  URINALYSIS, ROUTINE W REFLEX MICROSCOPIC  POC SARS CORONAVIRUS 2 AG -  ED  I-STAT BETA HCG BLOOD, ED ([facility], [facility], [facility] ONLY)    EKG EKG Interpretation  Date/Time:  Sunday May 12 2019 21:44:45 EST Ventricular Rate:  79 PR Interval:    QRS Duration: 80 QT Interval:  378 QTC Calculation: 434 R Axis:   113 Text Interpretation: Sinus rhythm Right axis deviation ST elev, probable normal early repol pattern No STMEI Confirmed by Octaviano Glow [phone]) on 05/12/2019 10:05:18 PM   Radiology DG Chest Port 1 View  Result Date: 05/12/2019 CLINICAL DATA:  Bilateral leg and feet swelling for 3 days EXAM: PORTABLE CHEST 1 VIEW COMPARISON:  None. FINDINGS: Low volumes with atelectatic changes. No consolidation, features of edema, pneumothorax, or effusion. The cardiomediastinal contours are unremarkable. No acute osseous or soft tissue abnormality. IMPRESSION: Low  volumes with atelectatic changes. No other acute cardiopulmonary abnormality. Electronically Signed   By: Lovena Le M.D.   On: 05/12/2019 22:06    Procedures Procedures (including critical care time)  Medications Ordered in ED Medications  insulin aspart (novoLOG) injection 5 Units (has no administration in time range)  sodium chloride 0.9 % bolus 1,000 mL (1,000 mLs Intravenous New Bag/Given 05/12/19 2332)    ED Course  I have reviewed the triage vital signs and the nursing notes.  Pertinent labs & imaging results that were available during my care of the patient were reviewed by me and considered in my medical decision making (see chart for details).  39 year old female who presents to the ED with multiple complaints; all appear chronic in nature. Pt reports she moved to the area in August 2020 and she does not have a PCP. She states she does not have a glucometer at home but has still been giving herself insulin and taking metformin. She is complaining of bilateral leg pain diffusely with swelling. No concern for DVT at this time given bilaterality and no risk factors. She has nonpitting edema bilaterally. Pt does report she has been feeling short of breath from time to time. No chest pain. Pt has also been dizzy. Will obtain screening labs at this time and obtain a CXR. Feel patient needs a PCP for further workup outpatient. Will check orthostatics given complaint of dizziness.   Orthostatics positive. Pt receiving fluids at this time. Her CBG is elevated; will give insulin.   CBC with mild elevated wbc at 11,000. Hgb stable. BMP without electrolyte abnormalities. Glucose elevated. LFTs within normal limits. BNP negative.   CXR clear.   11:51 PM At shift change case signed out to Quincy Carnes, PA-C, who will reevaluate patient and dispo accordingly. She is receiving fluids for orthostasis. Pt will need to find a PCP to follow up with.     MDM Rules/Calculators/A&P                        Final Clinical Impression(s) / ED Diagnoses Final diagnoses:  Type 2 diabetes mellitus without complication, with  long-term current use of insulin (HCC)  Pain in both lower extremities  Dizziness    Rx / DC Orders ED Discharge Orders    None       Eustaquio Maize, PA-C 05/12/19 2352    Wyvonnia Dusky, MD 05/13/19 [phone]

## 2019-05-12 NOTE — ED Notes (Signed)
Int. Will be here by 10 pm

## 2019-05-13 LAB — URINALYSIS, ROUTINE W REFLEX MICROSCOPIC
Bacteria, UA: NONE SEEN
Bilirubin Urine: NEGATIVE
Glucose, UA: >=500 mg/dL — AB
Ketones, ur: NEGATIVE mg/dL
Leukocytes,Ua: NEGATIVE
Nitrite: NEGATIVE
Protein, ur: 30 mg/dL — AB
RBC / HPF: >50 RBC/hpf — ABNORMAL HIGH (ref 0–5)
Specific Gravity, Urine: 1.024 (ref 1.005–1.030)
pH: 6 (ref 5.0–8.0)

## 2019-05-13 LAB — CBG MONITORING, ED: Glucose-Capillary: 191 mg/dL — ABNORMAL HIGH (ref 70–99)

## 2019-05-13 MED ORDER — BLOOD GLUCOSE MONITOR KIT: PACK | 0 refills | Status: DC

## 2019-05-13 MED ORDER — ACETAMINOPHEN 500 MG PO TABS
1000.0000 mg | ORAL_TABLET | Freq: Once | ORAL | Status: AC
  Administered 2019-05-13: 1000 mg via ORAL
  Filled 2019-05-13: qty 2

## 2019-05-13 NOTE — ED Notes (Signed)
Husband is in the car waiting on her

## 2019-05-13 NOTE — Discharge Instructions (Addendum)
We recommend that you follow-up with a primary care doctor as soon as possible. Try to get glucometer and keep a close eye on your sugars at home. Return here for any new/acute changes.

## 2019-05-13 NOTE — ED Notes (Signed)
Please call [facility]  When discharged for a ride [phone]

## 2019-05-20 ENCOUNTER — Telehealth: Payer: Self-pay | Admitting: *Deleted

## 2019-05-20 NOTE — Telephone Encounter (Deleted)
E11.9

## 2019-05-20 NOTE — Telephone Encounter (Addendum)
Pharmacy called for ICD 10 code for pt last visit.  EDCM reviewed chart to find  E 11.9 and relayed it to pharmacist.

## 2019-06-05 LAB — GC/CHLAMYDIA PROBE AMP
Chlamydia trachomatis, NAA: NEGATIVE
Neisseria Gonorrhoeae by PCR: NEGATIVE

## 2019-08-29 ENCOUNTER — Other Ambulatory Visit: Payer: Self-pay

## 2019-08-29 ENCOUNTER — Encounter: Payer: Self-pay | Admitting: *Deleted

## 2019-08-29 DIAGNOSIS — R05 Cough: Secondary | ICD-10-CM | POA: Insufficient documentation

## 2019-08-29 DIAGNOSIS — Z5321 Procedure and treatment not carried out due to patient leaving prior to being seen by health care provider: Secondary | ICD-10-CM | POA: Insufficient documentation

## 2019-08-29 DIAGNOSIS — R519 Headache, unspecified: Secondary | ICD-10-CM | POA: Diagnosis not present

## 2019-08-29 MED ORDER — ACETAMINOPHEN 325 MG PO TABS
650.0000 mg | ORAL_TABLET | Freq: Once | ORAL | Status: AC
  Administered 2019-08-29: 650 mg via ORAL
  Filled 2019-08-29: qty 2

## 2019-08-29 NOTE — ED Triage Notes (Signed)
Pt to ED reporting cough, congestion, headache, and chills for the past 2 days. Pt has been around her son who started having similar symptoms on Sunday. Pt reports concern for COVID.   Pt reports a headache as well as pain when coughing. No vomiting but loose stools reported.

## 2019-08-30 ENCOUNTER — Encounter (HOSPITAL_COMMUNITY): Payer: Self-pay

## 2019-08-30 ENCOUNTER — Emergency Department (HOSPITAL_COMMUNITY)
Admission: EM | Admit: 2019-08-30 | Discharge: 2019-08-31 | Disposition: A | Discharge status: Home / Self Care | Payer: Medicaid Other | Attending: Emergency Medicine | Admitting: Emergency Medicine

## 2019-08-30 ENCOUNTER — Emergency Department
Admission: EM | Admit: 2019-08-30 | Discharge: 2019-08-30 | Disposition: A | Discharge status: Home / Self Care | Payer: Medicaid Other | Attending: Emergency Medicine | Admitting: Emergency Medicine

## 2019-08-30 ENCOUNTER — Other Ambulatory Visit: Payer: Self-pay

## 2019-08-30 DIAGNOSIS — J111 Influenza due to unidentified influenza virus with other respiratory manifestations: Secondary | ICD-10-CM | POA: Insufficient documentation

## 2019-08-30 DIAGNOSIS — Z20822 Contact with and (suspected) exposure to covid-19: Secondary | ICD-10-CM | POA: Insufficient documentation

## 2019-08-30 DIAGNOSIS — E119 Type 2 diabetes mellitus without complications: Secondary | ICD-10-CM | POA: Insufficient documentation

## 2019-08-30 DIAGNOSIS — F17210 Nicotine dependence, cigarettes, uncomplicated: Secondary | ICD-10-CM | POA: Diagnosis not present

## 2019-08-30 DIAGNOSIS — J45909 Unspecified asthma, uncomplicated: Secondary | ICD-10-CM | POA: Insufficient documentation

## 2019-08-30 DIAGNOSIS — Z7982 Long term (current) use of aspirin: Secondary | ICD-10-CM | POA: Diagnosis not present

## 2019-08-30 DIAGNOSIS — Z79899 Other long term (current) drug therapy: Secondary | ICD-10-CM | POA: Insufficient documentation

## 2019-08-30 DIAGNOSIS — I10 Essential (primary) hypertension: Secondary | ICD-10-CM | POA: Diagnosis not present

## 2019-08-30 DIAGNOSIS — R05 Cough: Secondary | ICD-10-CM | POA: Diagnosis present

## 2019-08-30 DIAGNOSIS — Z794 Long term (current) use of insulin: Secondary | ICD-10-CM | POA: Diagnosis not present

## 2019-08-30 NOTE — ED Triage Notes (Signed)
Pt arrives to ED w/ c/o chest pain, cough, sob, fever, and headache x 1 week.

## 2019-08-30 NOTE — ED Notes (Signed)
Attempted to call pt back to triage no answer.

## 2019-08-31 ENCOUNTER — Emergency Department (HOSPITAL_COMMUNITY): Payer: Medicaid Other

## 2019-08-31 LAB — CBG MONITORING, ED: Glucose-Capillary: 163 mg/dL — ABNORMAL HIGH (ref 70–99)

## 2019-08-31 LAB — SARS CORONAVIRUS 2 (TAT 6-24 HRS): SARS Coronavirus 2: NEGATIVE

## 2019-08-31 MED ORDER — PREDNISONE 10 MG PO TABS: 20.0000 mg | ORAL_TABLET | Freq: Every day | ORAL | 0 refills | Status: DC

## 2019-08-31 MED ORDER — ALBUTEROL SULFATE HFA 108 (90 BASE) MCG/ACT IN AERS
1.0000 | INHALATION_SPRAY | Freq: Four times a day (QID) | RESPIRATORY_TRACT | Status: DC
  Administered 2019-08-31: 1 via RESPIRATORY_TRACT
  Filled 2019-08-31: qty 6.7

## 2019-08-31 NOTE — ED Provider Notes (Signed)
[facility] EMERGENCY DEPARTMENT Provider Note   CSN: [id number] Arrival date & time: 08/30/19  2011     History Chief Complaint  Patient presents with  . Cough    Jane Schultz is a 39 y.o. female.  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  Patient presents to the emergency room with flulike symptoms that started on Sunday.  Patient explains that her son was recently sick and she feels like she has what her son had.  Son was recently tested for Covid and they are waiting for the test results. Patient has significant medical history of diabetes, asthma, hypertension.  Patient complains of fever, chills and cough which produces phlegm.  Patient states her highest recorded fever was 100.  She admits that she has had some wheezing and occasional diarrhea.  She denies seeing any blood in her diarrhea.  patient denies having any shortness of breath or difficulty breathing.       Past Medical History:  Diagnosis Date  . Asthma   . Diabetes mellitus without complication ([place])   . Hypertension     There are no problems to display for this patient.   Past Surgical History:  Procedure Laterality Date  . CESAREAN SECTION       OB History   No obstetric history on file.     History reviewed. No pertinent family history.  Social History   Tobacco Use  . Smoking status: Current Every Day Smoker    Packs/day: 2.00    Types: Cigarettes  . Smokeless tobacco: Never Used  Substance Use Topics  . Alcohol use: Never  . Drug use: Yes    Types: Marijuana    Home Medications Prior to Admission medications   Medication Sig Start Date End Date Taking? Authorizing Provider  aspirin EC 81 MG tablet Take 81 mg by mouth daily.    [provider]  blood glucose meter kit and supplies KIT Dispense based on patient and insurance preference. Use up to four times daily as directed. (FOR ICD-9 250.00, 250.01).  05/13/19   Larene Pickett, PA-C  cephALEXin (KEFLEX) 500 MG capsule Take 1 capsule (500 mg total) by mouth 2 (two) times daily. Patient not taking: Reported on 05/12/2019 07/11/18   Jola Schmidt, MD  clindamycin (CLEOCIN) 300 MG capsule Take 1 capsule (300 mg total) by mouth 3 (three) times daily. Patient not taking: Reported on 05/12/2019 01/16/19   Paulette Blanch, MD  HYDROcodone-acetaminophen [facility]) 5-325 MG tablet Take 1 tablet by mouth every 6 (six) hours as needed for moderate pain. Patient not taking: Reported on 05/12/2019 01/16/19   Paulette Blanch, MD  insulin glargine (LANTUS) 100 UNIT/ML injection Inject 10-60 Units into the skin See admin instructions. Use 10 units every morning and use 60 units at bedtime    [provider]  insulin lispro (HUMALOG) 100 UNIT/ML injection Inject 20 Units into the skin daily with lunch.    [provider]  LISINOPRIL PO Take 1 tablet by mouth daily.    [provider]  METFORMIN HCL PO Take 2 tablets by mouth daily.    [provider]  metroNIDAZOLE (FLAGYL) 500 MG tablet Take 1 tablet (500 mg total) by mouth 2 (two) times daily. Patient not taking: Reported on 05/12/2019 01/16/19   Paulette Blanch, MD  ondansetron (ZOFRAN ODT) 8 MG disintegrating tablet Take 1 tablet (8 mg total) by mouth every 8 (eight) hours  as needed for nausea or vomiting. Patient not taking: Reported on 05/12/2019 07/11/18   Jola Schmidt, MD  predniSONE (DELTASONE) 10 MG tablet Take 2 tablets (20 mg total) by mouth daily. 08/31/19   Marcello Fennel, PA-C    Allergies    Patient has no known allergies.  Review of Systems   Review of Systems  Constitutional: Positive for chills and fever. Negative for diaphoresis.  HENT: Positive for sore throat. Negative for congestion.   Respiratory: Positive for cough and wheezing. Negative for shortness of breath.   Cardiovascular: Negative for chest pain.  Gastrointestinal: Positive for diarrhea. Negative for  abdominal pain, nausea and vomiting.  Genitourinary: Negative for enuresis and frequency.  Musculoskeletal: Negative for back pain.  Skin: Negative for rash.  Neurological: Negative for dizziness and headaches.  Hematological: Does not bruise/bleed easily.    Physical Exam Updated Vital Signs BP (!) 148/94 (BP Location: Right Arm)   Pulse 93   Temp 99.5 F (37.5 C) (Oral)   Resp 20   Ht 5' 7"  (1.702 m)   Wt 117.9 kg   LMP 07/31/2019 Comment: Pt shielded   SpO2 95%   BMI 40.72 kg/m   Physical Exam Vitals and nursing note reviewed.  Constitutional:      General: She is not in acute distress.    Appearance: She is not ill-appearing.  HENT:     Head: Normocephalic and atraumatic.     Nose: No congestion.     Mouth/Throat:     Mouth: Mucous membranes are moist.     Pharynx: Oropharynx is clear.  Eyes:     General: No scleral icterus. Cardiovascular:     Rate and Rhythm: Normal rate and regular rhythm.     Pulses: Normal pulses.     Heart sounds: No murmur. No friction rub. No gallop.   Pulmonary:     Effort: No respiratory distress.     Breath sounds: Wheezing present. No rhonchi or rales.  Abdominal:     General: There is no distension.     Tenderness: There is no abdominal tenderness. There is no guarding.  Musculoskeletal:        General: No swelling.  Skin:    General: Skin is warm and dry.     Findings: No rash.  Neurological:     Mental Status: She is alert.  Psychiatric:        Mood and Affect: Mood normal.     ED Results / Procedures / Treatments   Labs (all labs ordered are listed, but only abnormal results are displayed) Labs Reviewed  CBG MONITORING, ED - Abnormal; Notable for the following components:      Result Value   Glucose-Capillary 163 (*)    All other components within normal limits  SARS CORONAVIRUS 2 (TAT 6-24 HRS)    EKG None  Radiology DG Chest Portable 1 View  Result Date: 08/31/2019 CLINICAL DATA:  Chest pain and cough  EXAM: PORTABLE CHEST 1 VIEW COMPARISON:  05/12/2019 FINDINGS: The heart size and mediastinal contours are within normal limits. Both lungs are clear. The visualized skeletal structures are unremarkable. IMPRESSION: Clear lungs Electronically Signed   By: Ulyses Jarred M.D.   On: 08/31/2019 04:40    Procedures Procedures (including critical care time)  Medications Ordered in ED Medications  albuterol (VENTOLIN HFA) 108 (90 Base) MCG/ACT inhaler 1 puff (has no administration in time range)    ED Course  I have reviewed the triage vital signs and  the nursing notes.  Pertinent labs & imaging results that were available during my care of the patient were reviewed by me and considered in my medical decision making (see chart for details).    MDM Rules/Calculators/A&P                     I personally reviewed the patient's chest x-ray which did not show any abnormalities.    Patient did not appear to be in any acute distress, she was not having difficulty breathing and was resting comfortably. The patient's vitals remained stable, she did not have a fever, she was not tachypneic and she was not tachycardic.  She was satting on 95% room air.  Due to patient's concern of Covid a Covid test was performed and the results will be released to her within 24 hours.  She was given a albuterol inhaler to use as needed for her wheezing as well as a prescription for steroids.  If the patient's symptoms worsen i.e. she becomes more short of breath that she should come back to the emergency room for further evaluation.  Patient was explained these results and she verbalized that she understood them.  Patient was instructed to self quarantine until she has the results for her Covid results.  Final Clinical Impression(s) / ED Diagnoses Final diagnoses:  Influenza-like illness    Rx / DC Orders ED Discharge Orders         Ordered    predniSONE (DELTASONE) 10 MG tablet  Daily     08/31/19 0504             Marcello Fennel, PA-C 08/31/19 1567    Merrily Pew, MD 08/31/19 [phone]

## 2019-08-31 NOTE — Discharge Instructions (Addendum)
Patient was given a albuterol inhaler to use as needed for wheezing.  Patient was also prescribed prednisone follow directions on bottle.  If symptoms worsen i.e. she becomes more short of breath she should return to the emergency department for further evaluation.  Patient can find results of Covid test on my chart.  Patient should quarantine until she knows the results of her Covid test and if she is Covid positive she should quarantine for 14 days.

## 2020-04-14 ENCOUNTER — Emergency Department: Payer: Medicaid Other

## 2020-04-14 ENCOUNTER — Other Ambulatory Visit: Payer: Self-pay

## 2020-04-14 ENCOUNTER — Emergency Department
Admission: EM | Admit: 2020-04-14 | Discharge: 2020-04-14 | Disposition: A | Discharge status: Home / Self Care | Payer: Medicaid Other | Attending: Emergency Medicine | Admitting: Emergency Medicine

## 2020-04-14 DIAGNOSIS — J45909 Unspecified asthma, uncomplicated: Secondary | ICD-10-CM | POA: Insufficient documentation

## 2020-04-14 DIAGNOSIS — N644 Mastodynia: Secondary | ICD-10-CM | POA: Diagnosis not present

## 2020-04-14 DIAGNOSIS — Z79899 Other long term (current) drug therapy: Secondary | ICD-10-CM | POA: Insufficient documentation

## 2020-04-14 DIAGNOSIS — E119 Type 2 diabetes mellitus without complications: Secondary | ICD-10-CM | POA: Insufficient documentation

## 2020-04-14 DIAGNOSIS — M79674 Pain in right toe(s): Secondary | ICD-10-CM | POA: Diagnosis not present

## 2020-04-14 DIAGNOSIS — Z7982 Long term (current) use of aspirin: Secondary | ICD-10-CM | POA: Insufficient documentation

## 2020-04-14 DIAGNOSIS — I10 Essential (primary) hypertension: Secondary | ICD-10-CM | POA: Insufficient documentation

## 2020-04-14 DIAGNOSIS — F17210 Nicotine dependence, cigarettes, uncomplicated: Secondary | ICD-10-CM | POA: Diagnosis not present

## 2020-04-14 DIAGNOSIS — Z794 Long term (current) use of insulin: Secondary | ICD-10-CM | POA: Diagnosis not present

## 2020-04-14 LAB — CBG MONITORING, ED: Glucose-Capillary: 201 mg/dL — ABNORMAL HIGH (ref 70–99)

## 2020-04-14 MED ORDER — NAPROXEN 375 MG PO TABS: 375.0000 mg | ORAL_TABLET | Freq: Two times a day (BID) | ORAL | 0 refills | Status: DC

## 2020-04-14 NOTE — Discharge Instructions (Addendum)
Keep your appointment with your doctor on January 3 or make phone calls to the other clinics listed on your discharge papers to establish a primary care provider.  A prescription for naproxen was sent to the pharmacy and a good Rx card is attached to your discharge papers.  Continue to take your insulin as prescribed by your doctor.  You may use warm compresses to your breast or ice packs to see which helps with the tenderness more.  There is no fracture seen in your toe.

## 2020-04-14 NOTE — ED Notes (Signed)
Pt alert and oriented X 4, stable for discharge. RR even and unlabored, color WNL. Discussed discharge instructions and follow-up as directed. Discharge medications discussed if prescribed. Pt had opportunity to ask questions, and RN to provide patient/family eduction. Interpreter for ASL used for discharge.

## 2020-04-14 NOTE — ED Provider Notes (Signed)
[facility] Emergency Department Provider Note  ____________________________________________   Event Date/Time   First MD Initiated Contact with Patient 04/14/20 [phone]     (approximate)  I have reviewed the triage vital signs and the nursing notes.   HISTORY  Chief Complaint Breast Pain Language line sign interpreter used to get history and information during physical exam.  HPI Jane Schultz is a 39 y.o. female presents to the ED with complaint of right sided breast pain for approximately 1 week.  Patient denies any fever or warmth to the skin.  She is seen no drainage from the nipple area.  There is been no history of injury.  Patient is also insulin-dependent diabetic and does not check her blood sugars often.  She states that she just moved to this area and had an appointment with the doctor on December 3 but does not remember the name of the doctor nor does she know the name of the clinic and most likely will not be keeping that appointment.  She also complains of her right fifth toe hurting and states that she is uncertain as to a specific injury but "believes her toe might be broken".  Patient admits to smoking and to undetermined amount of marijuana.  She denies any use of alcohol.  Patient last ate at approximately 8:30 AM and has not taken her insulin this morning.         Past Medical History:  Diagnosis Date  . Asthma   . Diabetes mellitus without complication ([place])   . Hypertension     There are no problems to display for this patient.   Past Surgical History:  Procedure Laterality Date  . CESAREAN SECTION      Prior to Admission medications   Medication Sig Start Date End Date Taking? Authorizing Provider  naproxen (NAPROSYN) 375 MG tablet Take 1 tablet (375 mg total) by mouth 2 (two) times daily with a meal. 04/14/20  Yes Johnn Hai, PA-C  aspirin EC 81 MG tablet Take 81 mg by mouth daily.    [provider]   blood glucose meter kit and supplies KIT Dispense based on patient and insurance preference. Use up to four times daily as directed. (FOR ICD-9 250.00, 250.01). 05/13/19   Larene Pickett, PA-C  insulin glargine (LANTUS) 100 UNIT/ML injection Inject 10-60 Units into the skin See admin instructions. Use 10 units every morning and use 60 units at bedtime    [provider]  insulin lispro (HUMALOG) 100 UNIT/ML injection Inject 20 Units into the skin daily with lunch.    [provider]  LISINOPRIL PO Take 1 tablet by mouth daily.    [provider]  METFORMIN HCL PO Take 2 tablets by mouth daily.    [provider]    Allergies Patient has no known allergies.  No family history on file.  Social History Social History   Tobacco Use  . Smoking status: Current Every Day Smoker    Packs/day: 2.00    Types: Cigarettes  . Smokeless tobacco: Never Used  Vaping Use  . Vaping Use: Former  Substance Use Topics  . Alcohol use: Never  . Drug use: Yes    Types: Marijuana    Review of Systems Constitutional: No fever/chills Eyes: No visual changes. ENT: No sore throat. Cardiovascular: Denies chest pain. Respiratory: Denies shortness of breath. Gastrointestinal: No abdominal pain.  No nausea, no vomiting.  No diarrhea.  Genitourinary: Negative for dysuria. Musculoskeletal: Positive  for right fifth toe pain. Skin: Positive right breast pain. Neurological: Negative for headaches, focal weakness or numbness. Endocrine:  Insulin-dependent diabetic. ____________________________________________   PHYSICAL EXAM:  VITAL SIGNS: ED Triage Vitals  Enc Vitals Group     BP 04/14/20 0857 (!) 157/81     Pulse Rate 04/14/20 0857 79     Resp 04/14/20 0857 16     Temp 04/14/20 0857 98.6 F (37 C)     Temp Source 04/14/20 0857 Oral     SpO2 04/14/20 0857 99 %     Weight 04/14/20 0856 200 lb (90.7 kg)     Height 04/14/20 0856 _0  (1.702 m)     Head  Circumference --      Peak Flow --      Pain Score 04/14/20 0858 8     Pain Loc --      Pain Edu? --      Excl. in [place]? --     Constitutional: Alert and oriented. Well appearing and in no acute distress. Eyes: Conjunctivae are normal.  Head: Atraumatic. Nose: No congestion/rhinnorhea. Neck: No stridor.   Cardiovascular: Normal rate, regular rhythm. Grossly normal heart sounds.  Good peripheral circulation. Respiratory: Normal respiratory effort.  No retractions. Lungs CTAB. Gastrointestinal: Soft and nontender. No distention.  No CVA tenderness. Musculoskeletal: On examination of the right fifth digit there appears to be a callus-like area on the lateral aspect that is tender to touch.  No erythema.  No evidence of infection or abrasions for injury.  No soft tissue edema and capillary refills less than 3 seconds. Neurologic:  Normal speech and language. No gross focal neurologic deficits are appreciated. No gait instability. Skin:  Skin is warm, dry and intact.  Examination of the right breast there is no discharge from the nipples.  There is no area of warmth or erythema.  There is some generalized tenderness on palpation of the lateral aspect of the breast.  No dimpling or discoloration seen.  No axillary lymphadenopathy is appreciated on palpation.  Breast appear symmetric bilaterally. Psychiatric: Mood and affect are normal. Speech and behavior are normal.  ____________________________________________   LABS (all labs ordered are listed, but only abnormal results are displayed)  Labs Reviewed  CBG MONITORING, ED - Abnormal; Notable for the following components:      Result Value   Glucose-Capillary 201 (*)    All other components within normal limits   ____________________________________________  RADIOLOGY I, Johnn Hai, personally viewed and evaluated these images (plain radiographs) as part of my medical decision making, as well as reviewing the written report by the  radiologist.   Official radiology report(s): DG Toe 5th Right  Result Date: 04/14/2020 CLINICAL DATA:  39 year old female with pain for 1 week. EXAM: RIGHT FIFTH TOE COMPARISON:  Right foot series 01/16/2019. FINDINGS: Three views of the right 5th toe. 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 Other joint spaces and alignment are normal. Bone mineralization is within normal limits. No acute osseous abnormality identified. Visible soft tissue contours are within normal limits. IMPRESSION: No acute osseous abnormality identified about the right 5th toe. Electronically Signed   By: Genevie Ann M.D.   On: 04/14/2020 10:05    ____________________________________________   PROCEDURES  Procedure(s) performed (including Critical Care):  Procedures   ____________________________________________   INITIAL IMPRESSION / ASSESSMENT AND PLAN / ED COURSE  As part of my medical decision making, I reviewed the following data within the electronic medical  record:  Notes from prior ED visits and [place] Controlled Substance Database  39 year old female presents to the ED with complaint of right fifth toe pain without known history of injury and right breast pain for 1 week.  Patient is deaf and the sign language interpreter was used on [employer] machine.  Patient also has history of insulin-dependent diabetes  and continues to use insulin even though she did not today.  Patient reports that she has an appointment with a local doctor on January 3 but is not certain the name of the doctor.  Nonfasting glucose fingerstick was 201 with patient not taking any medication this morning.  Right breast exam without nipple discharge, dimpling, erythema or warmth.  A prescription for naproxen 375 mg was sent to the pharmacy to be taken 1 twice a day with food for the next 10 days as needed.  Patient was made aware that her x-ray was negative for any fracture in her fifth toe.  Most likely this is  coming from the corn noted on the lateral aspect.  Patient is encouraged to keep the appointment with the doctor that she was assigned on January 3.  If she is unable to make this appointment the list of other options was on her discharge papers and she is strongly encouraged to call and make arrangements to be seen at 1 of these clinics. ____________________________________________   FINAL CLINICAL IMPRESSION(S) / ED DIAGNOSES  Final diagnoses:  Breast pain, right  Toe pain, right     ED Discharge Orders         Ordered    naproxen (NAPROSYN) 375 MG tablet  2 times daily with meals        04/14/20 1048          *Please note:  Jane Schultz was evaluated in Emergency Department on 04/14/2020 for the symptoms described in the history of present illness. She was evaluated in the context of the global COVID-19 pandemic, which necessitated consideration that the patient might be at risk for infection with the SARS-CoV-2 virus that causes COVID-19. Institutional protocols and algorithms that pertain to the evaluation of patients at risk for COVID-19 are in a state of rapid change based on information released by regulatory bodies including the CDC and federal and state organizations. These policies and algorithms were followed during the patient's care in the ED.  Some ED evaluations and interventions may be delayed as a result of limited staffing during and the pandemic.*   Note:  This document was prepared using Dragon voice recognition software and may include unintentional dictation errors.    Johnn Hai, PA-C 04/14/20 1139    Harvest Dark, MD 04/14/20 1234

## 2020-04-14 NOTE — ED Notes (Signed)
RN in with PA to complete an assessment at this time. Pt c/o right pinky toe pain. Pt reports hitting it around 1 week ago when moving. On assessment it appears to be a korn located on rt pinky toe. Pt also c/o rt breast pain, tender to palpation when PA conducting assessment.

## 2020-04-14 NOTE — ED Triage Notes (Signed)
Pt is c/o right sided breast pain for awhile, possibly a week per pt, denies any nipple discharge, states family hx of cancer Sign language interpreter used

## 2020-06-25 ENCOUNTER — Other Ambulatory Visit: Payer: Self-pay

## 2020-06-25 ENCOUNTER — Encounter: Payer: Self-pay | Admitting: Emergency Medicine

## 2020-06-25 ENCOUNTER — Emergency Department
Admission: EM | Admit: 2020-06-25 | Discharge: 2020-06-25 | Disposition: A | Discharge status: Home / Self Care | Payer: Medicaid Other | Attending: Emergency Medicine | Admitting: Emergency Medicine

## 2020-06-25 ENCOUNTER — Emergency Department: Payer: Medicaid Other

## 2020-06-25 DIAGNOSIS — M79604 Pain in right leg: Secondary | ICD-10-CM | POA: Diagnosis not present

## 2020-06-25 DIAGNOSIS — I10 Essential (primary) hypertension: Secondary | ICD-10-CM | POA: Diagnosis not present

## 2020-06-25 DIAGNOSIS — Z794 Long term (current) use of insulin: Secondary | ICD-10-CM | POA: Insufficient documentation

## 2020-06-25 DIAGNOSIS — M79631 Pain in right forearm: Secondary | ICD-10-CM | POA: Diagnosis not present

## 2020-06-25 DIAGNOSIS — E119 Type 2 diabetes mellitus without complications: Secondary | ICD-10-CM | POA: Diagnosis not present

## 2020-06-25 DIAGNOSIS — J45909 Unspecified asthma, uncomplicated: Secondary | ICD-10-CM | POA: Diagnosis not present

## 2020-06-25 DIAGNOSIS — W1830XA Fall on same level, unspecified, initial encounter: Secondary | ICD-10-CM | POA: Diagnosis not present

## 2020-06-25 DIAGNOSIS — M79644 Pain in right finger(s): Secondary | ICD-10-CM | POA: Insufficient documentation

## 2020-06-25 DIAGNOSIS — Z7984 Long term (current) use of oral hypoglycemic drugs: Secondary | ICD-10-CM | POA: Insufficient documentation

## 2020-06-25 DIAGNOSIS — Z79899 Other long term (current) drug therapy: Secondary | ICD-10-CM | POA: Insufficient documentation

## 2020-06-25 DIAGNOSIS — Z7982 Long term (current) use of aspirin: Secondary | ICD-10-CM | POA: Insufficient documentation

## 2020-06-25 DIAGNOSIS — W19XXXA Unspecified fall, initial encounter: Secondary | ICD-10-CM

## 2020-06-25 DIAGNOSIS — R071 Chest pain on breathing: Secondary | ICD-10-CM | POA: Diagnosis not present

## 2020-06-25 DIAGNOSIS — M25531 Pain in right wrist: Secondary | ICD-10-CM | POA: Insufficient documentation

## 2020-06-25 DIAGNOSIS — M79601 Pain in right arm: Secondary | ICD-10-CM

## 2020-06-25 DIAGNOSIS — M79621 Pain in right upper arm: Secondary | ICD-10-CM | POA: Diagnosis not present

## 2020-06-25 HISTORY — DX: Unspecified hearing loss, unspecified ear: H91.90

## 2020-06-25 MED ORDER — CYCLOBENZAPRINE HCL 5 MG PO TABS: 5.0000 mg | ORAL_TABLET | Freq: Three times a day (TID) | ORAL | 0 refills | Status: DC | PRN

## 2020-06-25 NOTE — Discharge Instructions (Addendum)
Please seek medical attention for any high fevers, chest pain, shortness of breath, change in behavior, persistent vomiting, bloody stool or any other new or concerning symptoms.  

## 2020-06-25 NOTE — ED Notes (Signed)
Pt agreeable with d/c plan as reviewed by Dr Archie Balboa - this nurse has reviewed d/c instructions and provided written copy for pt prior to d/c home  (d/c instructions reviewed with ASL video interpreter).  Pt acknowledges understanding of d/c instructions and denies any additional questions, concerns, needs.  Escorted to lobby to await ride via w/c - no acute distress.

## 2020-06-25 NOTE — ED Notes (Addendum)
Pt arrives via w/c from waiting room; noted to be guarding R hand/R wrist- no obvisou bony deformities noted-- now to xray via stretcher.  Ice pack applied to R hand/wrist prior to leaving for xray ([employer] remote video incorporated for ASL services).

## 2020-06-25 NOTE — ED Notes (Signed)
Pt has returned from xray dept via stretcher
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
## 2020-09-01 ENCOUNTER — Emergency Department: Payer: Medicaid Other

## 2020-09-01 ENCOUNTER — Encounter: Payer: Self-pay | Admitting: *Deleted

## 2020-09-01 DIAGNOSIS — Z20822 Contact with and (suspected) exposure to covid-19: Secondary | ICD-10-CM | POA: Diagnosis not present

## 2020-09-01 DIAGNOSIS — R2241 Localized swelling, mass and lump, right lower limb: Secondary | ICD-10-CM | POA: Insufficient documentation

## 2020-09-01 DIAGNOSIS — R079 Chest pain, unspecified: Principal | ICD-10-CM | POA: Insufficient documentation

## 2020-09-01 DIAGNOSIS — E119 Type 2 diabetes mellitus without complications: Secondary | ICD-10-CM | POA: Insufficient documentation

## 2020-09-01 DIAGNOSIS — I10 Essential (primary) hypertension: Secondary | ICD-10-CM | POA: Insufficient documentation

## 2020-09-01 DIAGNOSIS — Z7982 Long term (current) use of aspirin: Secondary | ICD-10-CM | POA: Diagnosis not present

## 2020-09-01 DIAGNOSIS — H913 Deaf nonspeaking, not elsewhere classified: Secondary | ICD-10-CM | POA: Insufficient documentation

## 2020-09-01 DIAGNOSIS — Z79899 Other long term (current) drug therapy: Secondary | ICD-10-CM | POA: Diagnosis not present

## 2020-09-01 DIAGNOSIS — J45909 Unspecified asthma, uncomplicated: Secondary | ICD-10-CM | POA: Diagnosis not present

## 2020-09-01 DIAGNOSIS — R42 Dizziness and giddiness: Secondary | ICD-10-CM | POA: Diagnosis present

## 2020-09-01 DIAGNOSIS — Z794 Long term (current) use of insulin: Secondary | ICD-10-CM | POA: Diagnosis not present

## 2020-09-01 LAB — CBC
HCT: 41.7 % (ref 36.0–46.0)
Hemoglobin: 13.8 g/dL (ref 12.0–15.0)
MCH: 27.3 pg (ref 26.0–34.0)
MCHC: 33.1 g/dL (ref 30.0–36.0)
MCV: 82.4 fL (ref 80.0–100.0)
Platelets: 223 10*3/uL (ref 150–400)
RBC: 5.06 MIL/uL (ref 3.87–5.11)
RDW: 14 % (ref 11.5–15.5)
WBC: 13.1 10*3/uL — ABNORMAL HIGH (ref 4.0–10.5)
nRBC: 0 % (ref 0.0–0.2)

## 2020-09-01 LAB — TROPONIN I (HIGH SENSITIVITY): Troponin I (High Sensitivity): 6 ng/L (ref <18)

## 2020-09-01 LAB — BASIC METABOLIC PANEL
Anion gap: 9 (ref 5–15)
BUN: 13 mg/dL (ref 6–20)
CO2: 24 mmol/L (ref 22–32)
Calcium: 8.7 mg/dL — ABNORMAL LOW (ref 8.9–10.3)
Chloride: 102 mmol/L (ref 98–111)
Creatinine, Ser: 0.77 mg/dL (ref 0.44–1.00)
GFR, Estimated: >60 mL/min (ref >60)
Glucose, Bld: 274 mg/dL — ABNORMAL HIGH (ref 70–99)
Potassium: 4 mmol/L (ref 3.5–5.1)
Sodium: 135 mmol/L (ref 135–145)

## 2020-09-01 NOTE — ED Triage Notes (Addendum)
EMS brings pt in from home for c/o [facility]; pt is deaf; hx asthma, anxiety, diabetes (noncompliant with meds); pt to lobby via w/c with no distress noted

## 2020-09-01 NOTE — ED Triage Notes (Signed)
Pt brought in via ems from home.  Pt reports while shopping today, pt had episode of funny feeling in right leg, chest, dizziness.  Pt is deaf.  Pt alert  Interpreter on a stick used in triage.  Hx anxiety.

## 2020-09-02 ENCOUNTER — Other Ambulatory Visit: Payer: Self-pay

## 2020-09-02 ENCOUNTER — Other Ambulatory Visit: Payer: Self-pay | Admitting: Radiology

## 2020-09-02 ENCOUNTER — Observation Stay
Admission: EM | Admit: 2020-09-02 | Discharge: 2020-09-03 | Disposition: A | Discharge status: Home / Self Care | Payer: Medicaid Other | Attending: Internal Medicine | Admitting: Internal Medicine

## 2020-09-02 ENCOUNTER — Emergency Department: Payer: Medicaid Other

## 2020-09-02 ENCOUNTER — Observation Stay: Payer: Medicaid Other

## 2020-09-02 ENCOUNTER — Encounter: Payer: Self-pay | Admitting: Internal Medicine

## 2020-09-02 DIAGNOSIS — R079 Chest pain, unspecified: Secondary | ICD-10-CM

## 2020-09-02 DIAGNOSIS — H9190 Unspecified hearing loss, unspecified ear: Secondary | ICD-10-CM

## 2020-09-02 DIAGNOSIS — R072 Precordial pain: Secondary | ICD-10-CM | POA: Diagnosis not present

## 2020-09-02 DIAGNOSIS — I10 Essential (primary) hypertension: Secondary | ICD-10-CM

## 2020-09-02 DIAGNOSIS — J45909 Unspecified asthma, uncomplicated: Secondary | ICD-10-CM

## 2020-09-02 DIAGNOSIS — E119 Type 2 diabetes mellitus without complications: Secondary | ICD-10-CM

## 2020-09-02 LAB — CBC
HCT: 41.5 % (ref 36.0–46.0)
Hemoglobin: 13.6 g/dL (ref 12.0–15.0)
MCH: 27.4 pg (ref 26.0–34.0)
MCHC: 32.8 g/dL (ref 30.0–36.0)
MCV: 83.5 fL (ref 80.0–100.0)
Platelets: 185 10*3/uL (ref 150–400)
RBC: 4.97 MIL/uL (ref 3.87–5.11)
RDW: 13.9 % (ref 11.5–15.5)
WBC: 9.7 10*3/uL (ref 4.0–10.5)
nRBC: 0 % (ref 0.0–0.2)

## 2020-09-02 LAB — HIV ANTIBODY (ROUTINE TESTING W REFLEX): HIV Screen 4th Generation wRfx: NONREACTIVE

## 2020-09-02 LAB — CREATININE, SERUM
Creatinine, Ser: 0.72 mg/dL (ref 0.44–1.00)
GFR, Estimated: >60 mL/min (ref >60)

## 2020-09-02 LAB — GLUCOSE, CAPILLARY
Glucose-Capillary: 257 mg/dL — ABNORMAL HIGH (ref 70–99)
Glucose-Capillary: 178 mg/dL — ABNORMAL HIGH (ref 70–99)

## 2020-09-02 LAB — TROPONIN I (HIGH SENSITIVITY)
Troponin I (High Sensitivity): 25 ng/L — ABNORMAL HIGH (ref <18)
Troponin I (High Sensitivity): 19 ng/L — ABNORMAL HIGH (ref <18)
Troponin I (High Sensitivity): 18 ng/L — ABNORMAL HIGH (ref <18)

## 2020-09-02 LAB — SARS CORONAVIRUS 2 (TAT 6-24 HRS): SARS Coronavirus 2: NEGATIVE

## 2020-09-02 LAB — CBG MONITORING, ED
Glucose-Capillary: 187 mg/dL — ABNORMAL HIGH (ref 70–99)
Glucose-Capillary: 160 mg/dL — ABNORMAL HIGH (ref 70–99)

## 2020-09-02 LAB — HEMOGLOBIN A1C
Hgb A1c MFr Bld: 11.7 % — ABNORMAL HIGH (ref 4.8–5.6)
Mean Plasma Glucose: 289.09 mg/dL

## 2020-09-02 LAB — POC URINE PREG, ED: Preg Test, Ur: NEGATIVE

## 2020-09-02 MED ORDER — TECHNETIUM TC 99M TETROFOSMIN IV KIT
10.0000 | PACK | Freq: Once | INTRAVENOUS | Status: AC | PRN
  Administered 2020-09-02: 10.45 via INTRAVENOUS

## 2020-09-02 MED ORDER — ONDANSETRON HCL 4 MG/2ML IJ SOLN: 4.0000 mg | Freq: Four times a day (QID) | INTRAMUSCULAR | Status: DC | PRN

## 2020-09-02 MED ORDER — LORAZEPAM 0.5 MG PO TABS
0.5000 mg | ORAL_TABLET | Freq: Four times a day (QID) | ORAL | Status: DC | PRN
  Administered 2020-09-02 – 2020-09-03 (×3): 0.5 mg via ORAL
  Filled 2020-09-02 (×3): qty 1

## 2020-09-02 MED ORDER — ASPIRIN EC 81 MG PO TBEC
81.0000 mg | DELAYED_RELEASE_TABLET | Freq: Every day | ORAL | Status: DC
  Administered 2020-09-03: 81 mg via ORAL
  Filled 2020-09-02: qty 1

## 2020-09-02 MED ORDER — ACETAMINOPHEN 325 MG PO TABS
650.0000 mg | ORAL_TABLET | ORAL | Status: DC | PRN
  Administered 2020-09-02 – 2020-09-03 (×3): 650 mg via ORAL
  Filled 2020-09-02 (×3): qty 2

## 2020-09-02 MED ORDER — ASPIRIN 81 MG PO CHEW
324.0000 mg | CHEWABLE_TABLET | Freq: Once | ORAL | Status: AC
  Administered 2020-09-02: 324 mg via ORAL
  Filled 2020-09-02: qty 4

## 2020-09-02 MED ORDER — INSULIN ASPART 100 UNIT/ML IJ SOLN
0.0000 [IU] | Freq: Three times a day (TID) | INTRAMUSCULAR | Status: DC
  Administered 2020-09-02: 8 [IU] via SUBCUTANEOUS
  Administered 2020-09-03: 5 [IU] via SUBCUTANEOUS
  Filled 2020-09-02 (×2): qty 1

## 2020-09-02 MED ORDER — INSULIN ASPART 100 UNIT/ML IJ SOLN: 0.0000 [IU] | Freq: Every day | INTRAMUSCULAR | Status: DC

## 2020-09-02 MED ORDER — METOPROLOL TARTRATE 25 MG PO TABS
12.5000 mg | ORAL_TABLET | Freq: Two times a day (BID) | ORAL | Status: DC
  Administered 2020-09-02 – 2020-09-03 (×2): 12.5 mg via ORAL
  Filled 2020-09-02 (×3): qty 1

## 2020-09-02 MED ORDER — NITROGLYCERIN 0.4 MG SL SUBL: 0.4000 mg | SUBLINGUAL_TABLET | SUBLINGUAL | Status: DC | PRN

## 2020-09-02 MED ORDER — ENOXAPARIN SODIUM 60 MG/0.6ML IJ SOSY
0.5000 mg/kg | PREFILLED_SYRINGE | INTRAMUSCULAR | Status: DC
  Administered 2020-09-02 – 2020-09-03 (×2): 45 mg via SUBCUTANEOUS
  Filled 2020-09-02: qty 0.6
  Filled 2020-09-02 (×2): qty 0.45

## 2020-09-02 NOTE — ED Notes (Signed)
[facility] [place] notified about pt's increased anxiety. No new orders at this time.

## 2020-09-02 NOTE — ED Provider Notes (Signed)
[facility] Department Provider Note   ____________________________________________   Event Date/Time   First MD Initiated Contact with Patient 09/02/20 [phone]     (approximate)  I have reviewed the triage vital signs and the nursing notes.   HISTORY  Chief Complaint Dizziness   HPI Jane Schultz is a 40 y.o. female with past medical history of hypertension, diabetes, and asthma who presents to the ED for dizziness.  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  Patient states that she was at the store yesterday evening when she suddenly started to feel lightheaded and dizzy.  This was associated with a feeling of tightness in her chest and difficulty breathing.  She called EMS shortly after the onset of symptoms, however symptoms have since gradually improved. Symptoms lasted for about 15 minutes and she denies any ongoing pain in her chest or difficulty breathing.  She did notice some pain and swelling in her right leg with the onset of symptoms, states that pain felt like a cramping and swelling has persisted after chest pain and shortness of breath have resolved.  She reports similar symptoms in the past attributed to anxiety, reports having a catheterization performed multiple years ago in [place] that was normal.        Past Medical History:  Diagnosis Date  . Asthma   . Deaf   . Diabetes mellitus without complication ([place])   . Hypertension     Patient Active Problem List   Diagnosis Date Noted  . Type 2 diabetes mellitus without complication ([place]) 56/38/9373  . HTN (hypertension) 09/02/2020  . Asthma 09/02/2020  . Deaf 09/02/2020  . Chest pain 09/02/2020    Past Surgical History:  Procedure Laterality Date  . CESAREAN SECTION      Prior to Admission medications   Medication Sig Start Date End Date Taking? Authorizing Provider  aspirin EC 81 MG tablet Take 81 mg by mouth  daily.    [provider]  blood glucose meter kit and supplies KIT Dispense based on patient and insurance preference. Use up to four times daily as directed. (FOR ICD-9 250.00, 250.01). 05/13/19   Larene Pickett, PA-C  cyclobenzaprine (FLEXERIL) 5 MG tablet Take 1 tablet (5 mg total) by mouth 3 (three) times daily as needed (muscle pain). 06/25/20   Nance Pear, MD  insulin glargine (LANTUS) 100 UNIT/ML injection Inject 10-60 Units into the skin See admin instructions. Use 10 units every morning and use 60 units at bedtime    [provider]  insulin lispro (HUMALOG) 100 UNIT/ML injection Inject 20 Units into the skin daily with lunch.    [provider]  LISINOPRIL PO Take 1 tablet by mouth daily.    [provider]  METFORMIN HCL PO Take 2 tablets by mouth daily.    [provider]  naproxen (NAPROSYN) 375 MG tablet Take 1 tablet (375 mg total) by mouth 2 (two) times daily with a meal. 04/14/20   Johnn Hai, PA-C    Allergies Patient has no known allergies.  No family history on file.  Social History Social History   Tobacco Use  . Smoking status: Never Smoker  . Smokeless tobacco: Never Used  Vaping Use  . Vaping Use: Former  Substance Use Topics  . Alcohol use: Never  . Drug use: Yes    Types: Marijuana    Review of Systems  Constitutional: No fever/chills Eyes: No visual changes. ENT:  No sore throat. Cardiovascular: Positive for chest pain. Respiratory: Positive for shortness of breath. Gastrointestinal: No abdominal pain.  No nausea, no vomiting.  No diarrhea.  No constipation. Genitourinary: Negative for dysuria. Musculoskeletal: Negative for back pain.  Positive for leg swelling and pain. Skin: Negative for rash. Neurological: Negative for headaches, focal weakness or numbness.  ____________________________________________   PHYSICAL EXAM:  VITAL SIGNS: ED Triage Vitals  Enc Vitals Group     BP  09/01/20 2122 128/70     Pulse Rate 09/01/20 2122 82     Resp 09/01/20 2122 18     Temp 09/01/20 2122 98.4 F (36.9 C)     Temp Source 09/01/20 2122 Oral     SpO2 09/01/20 2105 100 %     Weight 09/01/20 2123 200 lb (90.7 kg)     Height 09/01/20 2123 5' 7"  (1.702 m)     Head Circumference --      Peak Flow --      Pain Score 09/01/20 2200 0     Pain Loc --      Pain Edu? --      Excl. in [place]? --     Constitutional: Alert and oriented. Eyes: Conjunctivae are normal. Head: Atraumatic. Nose: No congestion/rhinnorhea. Mouth/Throat: Mucous membranes are moist. Neck: Normal ROM Cardiovascular: Normal rate, regular rhythm. Grossly normal heart sounds.  2+ DP pulses bilaterally. Respiratory: Normal respiratory effort.  No retractions. Lungs CTAB. Gastrointestinal: Soft and nontender. No distention. Genitourinary: deferred Musculoskeletal: Trace pitting edema to right lower extremity at ankle with no associated calf tenderness.  No erythema or warmth noted. Neurologic:  Normal speech and language. No gross focal neurologic deficits are appreciated. Skin:  Skin is warm, dry and intact. No rash noted. Psychiatric: Mood and affect are normal. Speech and behavior are normal.  ____________________________________________   LABS (all labs ordered are listed, but only abnormal results are displayed)  Labs Reviewed  BASIC METABOLIC PANEL - Abnormal; Notable for the following components:      Result Value   Glucose, Bld 274 (*)    Calcium 8.7 (*)    All other components within normal limits  CBC - Abnormal; Notable for the following components:   WBC 13.1 (*)    All other components within normal limits  TROPONIN I (HIGH SENSITIVITY) - Abnormal; Notable for the following components:   Troponin I (High Sensitivity) 25 (*)    All other components within normal limits  SARS CORONAVIRUS 2 (TAT 6-24 HRS)  POC URINE PREG, ED  TROPONIN I (HIGH SENSITIVITY)    ____________________________________________  EKG  ED ECG REPORT I, Blake Divine, the attending physician, personally viewed and interpreted this ECG.   Date: 09/02/2020  EKG Time: 22:12  Rate: 64  Rhythm: normal sinus rhythm  Axis: LAD  Intervals:none  ST&T Change: None   PROCEDURES  Procedure(s) performed (including Critical Care):  Procedures   ____________________________________________   INITIAL IMPRESSION / ASSESSMENT AND PLAN / ED COURSE       40 year old female with past medical history of hypertension, diabetes, and asthma who presents to the ED complaining of episode of dizziness, chest pain, and shortness of breath.  Chest pain and shortness of breath have since resolved and patient is in no respiratory distress, maintaining O2 sats on room air.  EKG shows no evidence of arrhythmia or ischemia and initial troponin is negative, we will check second set but if this is negative patient will be appropriate for discharge home with PCP follow-up  given heart score of less than 4.  I doubt PE given resolution of her chest pain and shortness of breath.  She does have some mild swelling to her right lower extremity with no signs of infection, we will further assess with ultrasound for DVT.  Chest x-ray reviewed by me and shows no infiltrate, edema, or effusion.  Ultrasound is negative for DVT, low suspicion for PE as symptoms continue to improve.  Unfortunately, delta troponin is now elevated at 25 and patient will require admission for further trending of troponin.  We will give dose of aspirin and case discussed with hospitalist for admission.      ____________________________________________   FINAL CLINICAL IMPRESSION(S) / ED DIAGNOSES  Final diagnoses:  Chest pain, unspecified type     ED Discharge Orders    None       Note:  This document was prepared using Dragon voice recognition software and may include unintentional dictation errors.   Blake Divine, MD 09/02/20 [phone]

## 2020-09-02 NOTE — ED Notes (Signed)
Pt back from nuclear medicine.

## 2020-09-02 NOTE — Consult Note (Addendum)
Cardiology Consultation Note    Patient ID: Jane Schultz, MRN: [id number], DOB/AGE: May 11, 1980 40 y.o. Admit date: 09/02/2020   Date of Consult: 09/02/2020 Primary Physician: Patient, No Pcp Per (Inactive) Primary Cardiologist: none  Chief Complaint: chest pain/anxiety Reason for Consultation: chest pain/troponin Requesting MD: Dr. Alvia Grove  HPI: Jane Schultz is a 40 y.o. female with history of hypertension and diabetes.  She has deafness and read sign language.  She was brought to the emergency room after developing chest tightness in her upper chest.  She had not felt this previously.  She was also quite anxious.  In the emergency room electrocardiogram showed sinus rhythm with no ischemia.  Chest x-ray showed no acute cardiopulmonary disease lower extremity ultrasound revealed no evidence of a DVT on the right.  Serum troponin was 6 and 25.  Renal function is normal.  She is currently pain-free.  Past Medical History:  Diagnosis Date  . Asthma   . Deaf   . Diabetes mellitus without complication ([place])   . Hypertension       Surgical History:  Past Surgical History:  Procedure Laterality Date  . CESAREAN SECTION       Home Meds: Prior to Admission medications   Medication Sig Start Date End Date Taking? Authorizing Provider  aspirin EC 81 MG tablet Take 81 mg by mouth daily.    [provider]  blood glucose meter kit and supplies KIT Dispense based on patient and insurance preference. Use up to four times daily as directed. (FOR ICD-9 250.00, 250.01). 05/13/19   Larene Pickett, PA-C  cyclobenzaprine (FLEXERIL) 5 MG tablet Take 1 tablet (5 mg total) by mouth 3 (three) times daily as needed (muscle pain). 06/25/20   Nance Pear, MD  insulin glargine (LANTUS) 100 UNIT/ML injection Inject 10-60 Units into the skin See admin instructions. Use 10 units every morning and use 60 units at bedtime    [provider]  insulin lispro  (HUMALOG) 100 UNIT/ML injection Inject 20 Units into the skin daily with lunch.    [provider]  LISINOPRIL PO Take 1 tablet by mouth daily.    [provider]  METFORMIN HCL PO Take 2 tablets by mouth daily.    [provider]  naproxen (NAPROSYN) 375 MG tablet Take 1 tablet (375 mg total) by mouth 2 (two) times daily with a meal. 04/14/20   Johnn Hai, PA-C    Inpatient Medications:  . [START ON 09/03/2020] aspirin EC  81 mg Oral Daily  . enoxaparin (LOVENOX) injection  0.5 mg/kg Subcutaneous Q24H  . insulin aspart  0-15 Units Subcutaneous TID WC  . insulin aspart  0-5 Units Subcutaneous QHS  . metoprolol tartrate  12.5 mg Oral BID     Allergies: No Known Allergies  Social History   Socioeconomic History  . Marital status: Divorced    Spouse name: Not on file  . Number of children: Not on file  . Years of education: Not on file  . Highest education level: Not on file  Occupational History  . Not on file  Tobacco Use  . Smoking status: Never Smoker  . Smokeless tobacco: Never Used  Vaping Use  . Vaping Use: Former  Substance and Sexual Activity  . Alcohol use: Never  . Drug use: Yes    Types: Marijuana  . Sexual activity: Yes    Birth control/protection: None  Other Topics Concern  . Not on file  Social History Narrative  .  Not on file   Social Determinants of Health   Financial Resource Strain: Not on file  Food Insecurity: Not on file  Transportation Needs: Not on file  Physical Activity: Not on file  Stress: Not on file  Social Connections: Not on file  Intimate Partner Violence: Not on file     History reviewed. No pertinent family history.   Review of Systems: A 12-system review of systems was performed and is negative except as noted in the HPI.  Labs: No results for input(s): CKTOTAL, CKMB, TROPONINI in the last 72 hours. Lab Results  Component Value Date   WBC 9.7 09/02/2020   HGB 13.6 09/02/2020   HCT 41.5  09/02/2020   MCV 83.5 09/02/2020   PLT 185 09/02/2020    Recent Labs  Lab 09/01/20 2205 09/02/20 0531  NA 135  --   K 4.0  --   CL 102  --   CO2 24  --   BUN 13  --   CREATININE 0.77 0.72  CALCIUM 8.7*  --   GLUCOSE 274*  --    No results found for: CHOL, HDL, LDLCALC, TRIG No results found for: DDIMER  Radiology/Studies:  DG Chest 2 View  Result Date: 09/01/2020 CLINICAL DATA:  Chest discomfort and dizziness EXAM: CHEST - 2 VIEW COMPARISON:  06/25/2020 FINDINGS: The heart size and mediastinal contours are within normal limits. Both lungs are clear. The visualized skeletal structures are unremarkable. IMPRESSION: No active cardiopulmonary disease. Electronically Signed   By: Inez Catalina M.D.   On: 09/01/2020 22:29   US Venous Img Lower Unilateral Right  Result Date: 09/02/2020 CLINICAL DATA:  41 year old female with right lower extremity pain and swelling for 1 day. EXAM: RIGHT LOWER EXTREMITY VENOUS DOPPLER ULTRASOUND TECHNIQUE: Gray-scale sonography with compression, as well as color and duplex ultrasound, were performed to evaluate the deep venous system(s) from the level of the common femoral vein through the popliteal and proximal calf veins. COMPARISON:  Right femur series 06/25/2020. FINDINGS: VENOUS Normal compressibility of the common femoral, superficial femoral, and popliteal veins, as well as the visualized calf veins. Visualized portions of profunda femoral vein and great saphenous vein unremarkable. No filling defects to suggest DVT on grayscale or color Doppler imaging. Doppler waveforms show normal direction of venous flow, normal respiratory plasticity and response to augmentation. Limited views of the contralateral common femoral vein are unremarkable. OTHER None. Limitations: none IMPRESSION: No evidence of right lower extremity deep venous thrombosis. Electronically Signed   By: Genevie Ann M.D.   On: 09/02/2020 04:05    Wt Readings from Last 3 Encounters:  09/01/20  90.7 kg  06/25/20 90.7 kg  04/14/20 90.7 kg    EKG: Normal sinus rhythm with no ischemia  Physical Exam:  Blood pressure (!) 140/58, pulse (!) 58, temperature 98.4 F (36.9 C), temperature source Oral, resp. rate 19, height 5' 7" (1.702 m), weight 90.7 kg, last menstrual period 08/25/2020, SpO2 100 %. Body mass index is 31.32 kg/m. General: Well developed, well nourished, in no acute distress. Head: Normocephalic, atraumatic, sclera non-icteric, no xanthomas, nares are without discharge.  Neck: Negative for carotid bruits. JVD not elevated. Lungs: Clear bilaterally to auscultation without wheezes, rales, or rhonchi. Breathing is unlabored. Heart: RRR with S1 S2. No murmurs, rubs, or gallops appreciated. Abdomen: Soft, non-tender, non-distended with normoactive bowel sounds. No hepatomegaly. No rebound/guarding. No obvious abdominal masses. Msk:  Strength and tone appear normal for age. Extremities: No clubbing or cyanosis. No edema.  Distal pedal pulses are 2+ and equal bilaterally. Neuro: Alert and oriented X 3. No facial asymmetry. No focal deficit. Moves all extremities spontaneously. Psych:  Responds to questions appropriately with a normal affect.     Assessment and Plan  40 year old female with a history of hypertension diabetes who presented emergency room for chest heaviness.  Is ruled out for myocardial infarction.  EKG unremarkable.  Given risk factors we will proceed with a functional study to evaluate for ischemic etiology of her discomfort.  Further recommendations after this is complete.  Continue with metoprolol 12.5 twice daily, enteric-coated aspirin.  Signed, Teodoro Spray MD 09/02/2020, 8:17 AM Pager: [phone]   Addendum:  Pt was admitted with extremely atypical chest pain and has ruled out for an mi. She was ot able to tolerate the myoview study. I think she is stable and ok for discharge without any further in patient work up. Would discharge on asa 81 mg  daily and continue her lisinopril. I can see as out patient if desired.

## 2020-09-02 NOTE — H&P (Signed)
History and Physical    Jane Schultz ZOX:[id number] DOB: 02-27-1981 DOA: 09/02/2020  PCP: Patient, No Pcp Per (Inactive)   Patient coming from: Home  I have personally briefly reviewed patient's old medical records in [place]  Chief Complaint: Dizziness, chest tightness  HPI: Jane Schultz is a 40 y.o. female with medical history significant for diabetes, HTN, asthma and deaf mutism who presents to the ER with a complaint of chest tightness and lightheadedness associated with shortness of breath that started several hours prior to arrival while shopping in a store.  At the same time she noted right-sided leg pain.  The pain lasted 15 minutes before resolving.  She had no nausea, vomiting, diaphoresis.  She was chest pain-free on arrival and remains chest pain-free.  (Sign language interpreter used) ED Course: On arrival, afebrile, BP 127/70, pulse 82 O2 sat 100% on room air.  Troponin 6>> 25, WBC 13,000, blood glucose 274 EKG as reviewed by me : Normal sinus at 64 with no acute ST-T wave changes Imaging: Chest x-ray nonacute, Right lower extremity Doppler negative for DVT  Patient given aspirin.  Hospitalist consulted for admission  Review of Systems: As per HPI otherwise all other systems on review of systems negative.    Past Medical History:  Diagnosis Date  . Asthma   . Deaf   . Diabetes mellitus without complication ([place])   . Hypertension     Past Surgical History:  Procedure Laterality Date  . CESAREAN SECTION       reports that she has never smoked. She has never used smokeless tobacco. She reports current drug use. Drug: Marijuana. She reports that she does not drink alcohol.  No Known Allergies  History reviewed. No pertinent family history.    Prior to Admission medications   Medication Sig Start Date End Date Taking? Authorizing Provider  aspirin EC 81 MG tablet Take 81 mg by mouth daily.    [provider]  blood  glucose meter kit and supplies KIT Dispense based on patient and insurance preference. Use up to four times daily as directed. (FOR ICD-9 250.00, 250.01). 05/13/19   Larene Pickett, PA-C  cyclobenzaprine (FLEXERIL) 5 MG tablet Take 1 tablet (5 mg total) by mouth 3 (three) times daily as needed (muscle pain). 06/25/20   Nance Pear, MD  insulin glargine (LANTUS) 100 UNIT/ML injection Inject 10-60 Units into the skin See admin instructions. Use 10 units every morning and use 60 units at bedtime    [provider]  insulin lispro (HUMALOG) 100 UNIT/ML injection Inject 20 Units into the skin daily with lunch.    [provider]  LISINOPRIL PO Take 1 tablet by mouth daily.    [provider]  METFORMIN HCL PO Take 2 tablets by mouth daily.    [provider]  naproxen (NAPROSYN) 375 MG tablet Take 1 tablet (375 mg total) by mouth 2 (two) times daily with a meal. 04/14/20   Johnn Hai, PA-C    Physical Exam: Vitals:   09/01/20 2200 09/02/20 0230 09/02/20 0330 09/02/20 0400  BP:  (!) 122/58 139/63 (!) 140/58  Pulse:  (!) 56 (!) 50 (!) 58  Resp:  15 20 19   Temp:      TempSrc:      SpO2:  100% 100% 100%  Weight: 90.7 kg     Height: 5' 7"  (1.702 m)        Vitals:   09/01/20 2200 09/02/20 0230 09/02/20 0330 09/02/20  0400  BP:  (!) 122/58 139/63 (!) 140/58  Pulse:  (!) 56 (!) 50 (!) 58  Resp:  15 20 19   Temp:      TempSrc:      SpO2:  100% 100% 100%  Weight: 90.7 kg     Height: 5' 7"  (1.702 m)         Constitutional: Alert and oriented x 3 .  Appears anxious  HEENT:      Head: Normocephalic and atraumatic.         Eyes: PERLA, EOMI, Conjunctivae are normal. Sclera is non-icteric.       Mouth/Throat: Mucous membranes are moist.       Neck: Supple with no signs of meningismus. Cardiovascular: Regular rate and rhythm. No murmurs, gallops, or rubs. 2+ symmetrical distal pulses are present . No JVD. No LE edema Respiratory: Respiratory effort  normal .Lungs sounds clear bilaterally. No wheezes, crackles, or rhonchi.  Gastrointestinal: Soft, non tender, and non distended with positive bowel sounds.  Genitourinary: No CVA tenderness. Musculoskeletal: Nontender with normal range of motion in all extremities. No cyanosis, or erythema of extremities. Neurologic:  Face is symmetric. Moving all extremities. No gross focal neurologic deficits . Skin: Skin is warm, dry.  No rash or ulcers Psychiatric: Anxious and a bit tearful   Labs on Admission: I have personally reviewed following labs and imaging studies  CBC: Recent Labs  Lab 09/01/20 2205  WBC 13.1*  HGB 13.8  HCT 41.7  MCV 82.4  PLT 536   Basic Metabolic Panel: Recent Labs  Lab 09/01/20 2205  NA 135  K 4.0  CL 102  CO2 24  GLUCOSE 274*  BUN 13  CREATININE 0.77  CALCIUM 8.7*   GFR: Estimated Creatinine Clearance: 110.2 mL/min (by C-G formula based on SCr of 0.77 mg/dL). Liver Function Tests: No results for input(s): AST, ALT, ALKPHOS, BILITOT, PROT, ALBUMIN in the last 168 hours. No results for input(s): LIPASE, AMYLASE in the last 168 hours. No results for input(s): AMMONIA in the last 168 hours. Coagulation Profile: No results for input(s): INR, PROTIME in the last 168 hours. Cardiac Enzymes: No results for input(s): CKTOTAL, CKMB, CKMBINDEX, TROPONINI in the last 168 hours. BNP (last 3 results) No results for input(s): PROBNP in the last 8760 hours. HbA1C: No results for input(s): HGBA1C in the last 72 hours. CBG: No results for input(s): GLUCAP in the last 168 hours. Lipid Profile: No results for input(s): CHOL, HDL, LDLCALC, TRIG, CHOLHDL, LDLDIRECT in the last 72 hours. Thyroid Function Tests: No results for input(s): TSH, T4TOTAL, FREET4, T3FREE, THYROIDAB in the last 72 hours. Anemia Panel: No results for input(s): VITAMINB12, FOLATE, FERRITIN, TIBC, IRON, RETICCTPCT in the last 72 hours. Urine analysis:    Component Value Date/Time    COLORURINE YELLOW 05/13/2019 0045   APPEARANCEUR HAZY (A) 05/13/2019 0045   LABSPEC 1.024 05/13/2019 0045   PHURINE 6.0 05/13/2019 0045   GLUCOSEU >=500 (A) 05/13/2019 0045   HGBUR LARGE (A) 05/13/2019 0045   BILIRUBINUR NEGATIVE 05/13/2019 0045   KETONESUR NEGATIVE 05/13/2019 0045   PROTEINUR 30 (A) 05/13/2019 0045   NITRITE NEGATIVE 05/13/2019 0045   LEUKOCYTESUR NEGATIVE 05/13/2019 0045    Radiological Exams on Admission: DG Chest 2 View  Result Date: 09/01/2020 CLINICAL DATA:  Chest discomfort and dizziness EXAM: CHEST - 2 VIEW COMPARISON:  06/25/2020 FINDINGS: The heart size and mediastinal contours are within normal limits. Both lungs are clear. The visualized skeletal structures are unremarkable. IMPRESSION: No active  cardiopulmonary disease. Electronically Signed   By: Inez Catalina M.D.   On: 09/01/2020 22:29   US Venous Img Lower Unilateral Right  Result Date: 09/02/2020 CLINICAL DATA:  40 year old female with right lower extremity pain and swelling for 1 day. EXAM: RIGHT LOWER EXTREMITY VENOUS DOPPLER ULTRASOUND TECHNIQUE: Gray-scale sonography with compression, as well as color and duplex ultrasound, were performed to evaluate the deep venous system(s) from the level of the common femoral vein through the popliteal and proximal calf veins. COMPARISON:  Right femur series 06/25/2020. FINDINGS: VENOUS Normal compressibility of the common femoral, superficial femoral, and popliteal veins, as well as the visualized calf veins. Visualized portions of profunda femoral vein and great saphenous vein unremarkable. No filling defects to suggest DVT on grayscale or color Doppler imaging. Doppler waveforms show normal direction of venous flow, normal respiratory plasticity and response to augmentation. Limited views of the contralateral common femoral vein are unremarkable. OTHER None. Limitations: none IMPRESSION: No evidence of right lower extremity deep venous thrombosis. Electronically Signed    By: Genevie Ann M.D.   On: 09/02/2020 04:05     Assessment/Plan 40 year old female with history of diabetes, HTN, asthma and deaf mutism presenting with chest tightness and lightheadedness associated with shortness of breath resolving after 15 minutes    Chest pain - Typical chest pain, with CAD risk factors with troponin 6-25 - Nitroglycerin sublingual as needed chest pain with morphine for breakthrough - Aspirin, metoprolol and follow lipid profile - Cardiology consult  Right leg pain - Now resolved and Doppler study negative - D-dimer still pending    Type 2 diabetes mellitus without complication ([facility]CC) - Sliding scale insulin    HTN (hypertension) - Metoprolol twice daily    Asthma -Ventolin as needed    Deaf - Patient able to communicate by sign language    DVT prophylaxis: Lovenox  Code Status: full code  Family Communication:  none  Disposition Plan: Back to previous home environment Consults called: Cardiology Status:obs    Athena Masse MD [facility]     09/02/2020, 5:05 AM

## 2020-09-02 NOTE — Progress Notes (Signed)
PHARMACIST - PHYSICIAN COMMUNICATION  CONCERNING:  Enoxaparin (Lovenox) for DVT Prophylaxis    RECOMMENDATION: Patient was prescribed enoxaprin 40mg  q24 hours for VTE prophylaxis.   Filed Weights   09/01/20 2123 09/01/20 2200  Weight: 90.7 kg (200 lb) 90.7 kg (200 lb)    Body mass index is 31.32 kg/m.  Estimated Creatinine Clearance: 110.2 mL/min (by C-G formula based on SCr of 0.77 mg/dL).   Based on [place] patient is candidate for enoxaparin 0.5mg /kg TBW SQ every 24 hours based on BMI being >30.  DESCRIPTION: Pharmacy has adjusted enoxaparin dose per [facility] policy.  Patient is now receiving enoxaparin 0.5 mg/kg every 24 hours   Renda Rolls, PharmD, [facility] 09/02/2020 5:12 AM

## 2020-09-02 NOTE — ED Notes (Signed)
Informed RN bed assigned 

## 2020-09-02 NOTE — ED Notes (Signed)
Cardiology at bedside. Pt NPO for stress test today.

## 2020-09-02 NOTE — Plan of Care (Signed)

## 2020-09-02 NOTE — ED Notes (Signed)
[place] notified about pt's anxiety for stress test.

## 2020-09-02 NOTE — Progress Notes (Signed)
No charge progress note.  Jane Schultz is a 40 y.o. female with medical history significant for diabetes, HTN, asthma and deaf mutism who presents to the ER with a complaint of chest tightness and lightheadedness associated with shortness of breath that started several hours prior to arrival while shopping in a store.  At the same time she noted right-sided leg pain.  The pain lasted 15 minutes before resolving.  She had no nausea, vomiting, diaphoresis.  She was chest pain-free on arrival and remains chest pain-free.  EKG without any acute changes and troponin become barely positive at 25 with trending down. Cardiology was also consulted and they were recommending Myoview-apparently patient was unable to tolerate it and they do not recommend any further investigation at this time.  Patient denies any chest pain or shortness of breath when seen today.  She appears quite anxious. a sign language interpreter was used for communication.  Physical exam was benign.

## 2020-09-02 NOTE — ED Notes (Signed)
Pt taken for stress test.

## 2020-09-03 DIAGNOSIS — R072 Precordial pain: Secondary | ICD-10-CM | POA: Diagnosis not present

## 2020-09-03 DIAGNOSIS — I10 Essential (primary) hypertension: Secondary | ICD-10-CM

## 2020-09-03 DIAGNOSIS — E119 Type 2 diabetes mellitus without complications: Secondary | ICD-10-CM

## 2020-09-03 LAB — GLUCOSE, CAPILLARY: Glucose-Capillary: 206 mg/dL — ABNORMAL HIGH (ref 70–99)

## 2020-09-03 MED ORDER — ALPRAZOLAM 0.5 MG PO TABS: 0.5000 mg | ORAL_TABLET | Freq: Three times a day (TID) | ORAL | 0 refills | Status: DC | PRN

## 2020-09-03 MED ORDER — LISINOPRIL 10 MG PO TABS
10.0000 mg | ORAL_TABLET | Freq: Every day | ORAL | Status: DC
  Administered 2020-09-03: 10 mg via ORAL
  Filled 2020-09-03: qty 1

## 2020-09-03 MED ORDER — ATORVASTATIN CALCIUM 20 MG PO TABS: 20.0000 mg | ORAL_TABLET | Freq: Every day | ORAL | 11 refills | Status: DC

## 2020-09-03 MED ORDER — LISINOPRIL 10 MG PO TABS: 10.0000 mg | ORAL_TABLET | Freq: Every day | ORAL | 1 refills | Status: DC

## 2020-09-03 MED ORDER — METOPROLOL TARTRATE 25 MG PO TABS: 12.5000 mg | ORAL_TABLET | Freq: Two times a day (BID) | ORAL | 0 refills | Status: DC

## 2020-09-03 NOTE — Progress Notes (Signed)
  Inpatient Diabetes Program Recommendations  AACE/ADA: New Consensus Statement on Inpatient Glycemic Control (2015)  Target Ranges:  Prepandial:   less than 140 mg/dL      Peak postprandial:   less than 180 mg/dL (1-2 hours)      Critically ill patients:  140 - 180 mg/dL   Lab Results  Component Value Date   GLUCAP 206 (H) 09/03/2020   HGBA1C 11.7 (H) 09/02/2020      Used ASL interpreter for education.Education done regarding application and changing CGM sensor (alternate every 14 days on back of arms), 1 hour warm-up, use of glucometer/where to buy strips, how to scan CGM for glucose reading and information for PCP. Patient has been given [employer] reader and first sensor for use. Patient has also been given educational packet regarding use CGM sensor including the 1-800 toll free number for any questions, problems or needs related to the [facility] sensors or reader.  Patient to be given Rx. For sensors with prescriptions/discharge paper work.  Sensor applied by patient to (R) Arm at 1530.  Explained that glucose readings will not be available until 1 hours after application.  Patient verbalizes understanding of use of Freestyle Libre CGM and was told that any issues with blood sugars/diabetes will need to be addressed by PCP. Reviewed importance of taking medications as ordered, hypoglycemia signs and symptoms and treatment, and possible complications of DM.  Also discussed current A1C results. Patient states she understands and had no further questions to interpreter.   Thanks,  Adah Perl, RN, BC-ADM Inpatient Diabetes Coordinator Pager [phone] (8a-5p)

## 2020-09-03 NOTE — Progress Notes (Signed)
Patient discharged per orders, PIV/tele removed from patient. Discharge instructions reviewed and prescriptions provided. Patient taken down by NT via wheelchair to medical mall [facility] cable service).

## 2020-09-03 NOTE — Discharge Summary (Signed)
Physician Discharge Summary  Jane Schultz WNI:[id number] DOB: February 06, 1981 DOA: 09/02/2020  PCP: Patient, No Pcp Per (Inactive)  Admit date: 09/02/2020 Discharge date: 09/03/2020  Admitted From: Home Disposition: Home  Recommendations for Outpatient Follow-up:  1. Follow up with PCP in 1-2 weeks 2. Please obtain BMP/CBC in one week 3. Please follow up on the following pending results: None  Home Health: No Equipment/Devices: None Discharge Condition: Stable CODE STATUS: Full Diet recommendation: Heart Healthy / Carb Modified   Brief/Interim Summary: Jane Jackson-Williamsis a 40 y.o.femalewith medical history significant fordiabetes, HTN, asthma and deaf mutism who presents to the ER with a complaint of chest tightness and lightheadedness associated with shortness of breath that started several hours prior to arrival while shopping in a store. At the same time she noted right-sided leg pain. The pain lasted 15 minutes before resolving. She had no nausea, vomiting, diaphoresis.She was chest pain-free on arrivaland remains chest pain-free. EKG without any acute changes and troponin become barely positive at 25 with trending down. Cardiology was also consulted and they were recommending Myoview-apparently patient was unable to tolerate it and they do not recommend any further investigation at this time. Patient did not had any chest pain but appears very anxious.  The symptoms were thought to be related to anxiety.  Patient also do not have any primary care provider.  Symptoms improved with Xanax.  Patient was given small amount of Xanax to be used as needed and advise to get established with a primary care provider as soon as possible for better management of her anxiety.  Patient also had venous Doppler studies for her right leg pain and that they were negative for DVT.  She will continue rest of her home medications and need to get established with primary care  provider for better management of her chronic hypertension and diabetes.  Patient is deaf and all the communication was done with the help of a sign language interpreter.  Discharge Diagnoses:  Principal Problem:   Chest pain Active Problems:   Type 2 diabetes mellitus without complication (HCC)   HTN (hypertension)   Asthma   Deaf   Discharge Instructions  Discharge Instructions    Diet - low sodium heart healthy   Complete by: As directed    Discharge instructions   Complete by: As directed    It was pleasure taking care of you. It is very important that you take your blood pressure and diabetes medications to keep them under good control as they will increase multiple risk which include heart attack, losing your limb, losing your vision and kidney disease. Please follow-up with your primary care provider very closely to monitor your blood pressure and diabetes and to titrate your medications accordingly. You are also being given a small amount of Xanax to be used only for anxiety, please follow-up with primary care provider for further recommendations.   Increase activity slowly   Complete by: As directed      Allergies as of 09/03/2020   No Known Allergies     Medication List    STOP taking these medications   naproxen 375 MG tablet Commonly known as: NAPROSYN     TAKE these medications   ALPRAZolam 0.5 MG tablet Commonly known as: Xanax Take 1 tablet (0.5 mg total) by mouth 3 (three) times daily as needed for sleep or anxiety.   aspirin EC 81 MG tablet Take 81 mg by mouth daily.   atorvastatin 20 MG tablet Commonly known as: Lipitor Take  1 tablet (20 mg total) by mouth daily.   blood glucose meter kit and supplies Kit Dispense based on patient and insurance preference. Use up to four times daily as directed. (FOR ICD-9 250.00, 250.01).   cyclobenzaprine 5 MG tablet Commonly known as: FLEXERIL Take 1 tablet (5 mg total) by mouth 3 (three) times daily as  needed (muscle pain).   insulin glargine 100 UNIT/ML injection Commonly known as: LANTUS Inject 10-60 Units into the skin See admin instructions. Use 10 units every morning and use 60 units at bedtime   insulin lispro 100 UNIT/ML injection Commonly known as: HUMALOG Inject 20 Units into the skin daily with lunch.   lisinopril 10 MG tablet Commonly known as: ZESTRIL Take 1 tablet (10 mg total) by mouth daily. What changed:   medication strength  how much to take   METFORMIN HCL PO Take 2 tablets by mouth daily.   metoprolol tartrate 25 MG tablet Commonly known as: LOPRESSOR Take 0.5 tablets (12.5 mg total) by mouth 2 (two) times daily.       No Known Allergies  Consultations:  Cardiology  Procedures/Studies: DG Chest 2 View  Result Date: 09/01/2020 CLINICAL DATA:  Chest discomfort and dizziness EXAM: CHEST - 2 VIEW COMPARISON:  06/25/2020 FINDINGS: The heart size and mediastinal contours are within normal limits. Both lungs are clear. The visualized skeletal structures are unremarkable. IMPRESSION: No active cardiopulmonary disease. Electronically Signed   By: Inez Catalina M.D.   On: 09/01/2020 22:29   US Venous Img Lower Unilateral Right  Result Date: 09/02/2020 CLINICAL DATA:  40 year old female with right lower extremity pain and swelling for 1 day. EXAM: RIGHT LOWER EXTREMITY VENOUS DOPPLER ULTRASOUND TECHNIQUE: Gray-scale sonography with compression, as well as color and duplex ultrasound, were performed to evaluate the deep venous system(s) from the level of the common femoral vein through the popliteal and proximal calf veins. COMPARISON:  Right femur series 06/25/2020. FINDINGS: VENOUS Normal compressibility of the common femoral, superficial femoral, and popliteal veins, as well as the visualized calf veins. Visualized portions of profunda femoral vein and great saphenous vein unremarkable. No filling defects to suggest DVT on grayscale or color Doppler imaging.  Doppler waveforms show normal direction of venous flow, normal respiratory plasticity and response to augmentation. Limited views of the contralateral common femoral vein are unremarkable. OTHER None. Limitations: none IMPRESSION: No evidence of right lower extremity deep venous thrombosis. Electronically Signed   By: Genevie Ann M.D.   On: 09/02/2020 04:05     Subjective: Patient is deaf, communication was done with the help of a sign interpreter. Patient denies any chest pain but she was very concerned about these anxiety/panic attacks when she feel like she is having a heart attack.  She does not have any established primary care provider in this area and was asking about some list specially who can work with deaf people. She was also concerned that she will not get a ride back home until late at night and asking if we can help. We also discussed about taking her medications very regularly and having her blood pressure and diabetes under good control. We agree on giving her some Xanax to be used as needed and she will contact her primary care provider for further recommendations.  Discharge Exam: Vitals:   09/03/20 0521 09/03/20 0731  BP: (!) 153/75 (!) 143/82  Pulse: (!) 57 63  Resp: 18   Temp: 98.4 F (36.9 C) 98.4 F (36.9 C)  SpO2: 100% 100%  Vitals:   09/02/20 1907 09/03/20 0123 09/03/20 0521 09/03/20 0731  BP: (!) 160/74 (!) 152/75 (!) 153/75 (!) 143/82  Pulse: (!) 56 (!) 52 (!) 57 63  Resp: _0 Temp: 98.1 F (36.7 C) 98.2 F (36.8 C) 98.4 F (36.9 C) 98.4 F (36.9 C)  TempSrc: Oral Oral Oral Oral  SpO2: 100% 100% 100% 100%  Weight:      Height:        General: Pt is alert, awake, not in acute distress Cardiovascular: RRR, S1/S2 +, no rubs, no gallops Respiratory: CTA bilaterally, no wheezing, no rhonchi Abdominal: Soft, NT, ND, bowel sounds + Extremities: no edema, no cyanosis   The results of significant diagnostics from this hospitalization (including  imaging, microbiology, ancillary and laboratory) are listed below for reference.    Microbiology: Recent Results (from the past 240 hour(s))  SARS CORONAVIRUS 2 (TAT 6-24 HRS) Nasopharyngeal Nasopharyngeal Swab     Status: None   Collection Time: 09/02/20  4:46 AM   Specimen: Nasopharyngeal Swab  Result Value Ref Range Status   SARS Coronavirus 2 NEGATIVE NEGATIVE Final    Comment: (NOTE) SARS-CoV-2 target nucleic acids are NOT DETECTED.  The SARS-CoV-2 RNA is generally detectable in upper and lower respiratory specimens during the acute phase of infection. Negative results do not preclude SARS-CoV-2 infection, do not rule out co-infections with other pathogens, and should not be used as the sole basis for treatment or other patient management decisions. Negative results must be combined with clinical observations, patient history, and epidemiological information. The expected result is Negative.  Fact Sheet for Patients: SugarRoll.be  Fact Sheet for Healthcare Providers: https://www.woods-mathews.com/  This test is not yet approved or cleared by the [place] FDA and  has been authorized for detection and/or diagnosis of SARS-CoV-2 by FDA under an Emergency Use Authorization (EUA). This EUA will remain  in effect (meaning this test can be used) for the duration of the COVID-19 declaration under Se ction 564(b)(1) of the Act, 21 U.S.C. section 360bbb-3(b)(1), unless the authorization is terminated or revoked sooner.  Performed at [facility], [place] [address] [place], [place] [address]      Labs: BNP (last 3 results) No results for input(s): BNP in the last 8760 hours. Basic Metabolic Panel: Recent Labs  Lab 09/01/20 2205 09/02/20 0531  NA 135  --   K 4.0  --   CL 102  --   CO2 24  --   GLUCOSE 274*  --   BUN 13  --   CREATININE 0.77 0.72  CALCIUM 8.7*  --    Liver Function Tests: No results for input(s): AST,  ALT, ALKPHOS, BILITOT, PROT, ALBUMIN in the last 168 hours. No results for input(s): LIPASE, AMYLASE in the last 168 hours. No results for input(s): AMMONIA in the last 168 hours. CBC: Recent Labs  Lab 09/01/20 2205 09/02/20 0531  WBC 13.1* 9.7  HGB 13.8 13.6  HCT 41.7 41.5  MCV 82.4 83.5  PLT 223 185   Cardiac Enzymes: No results for input(s): CKTOTAL, CKMB, CKMBINDEX, TROPONINI in the last 168 hours. BNP: Invalid input(s): POCBNP CBG: Recent Labs  Lab 09/02/20 0722 09/02/20 1416 09/02/20 1740 09/02/20 2021 09/03/20 0734  GLUCAP 187* 160* 257* 178* 206*   D-Dimer No results for input(s): DDIMER in the last 72 hours. Hgb A1c Recent Labs    09/02/20 0531  HGBA1C 11.7*   Lipid Profile No results for input(s): CHOL, HDL, LDLCALC, TRIG,  CHOLHDL, LDLDIRECT in the last 72 hours. Thyroid function studies No results for input(s): TSH, T4TOTAL, T3FREE, THYROIDAB in the last 72 hours.  Invalid input(s): FREET3 Anemia work up No results for input(s): VITAMINB12, FOLATE, FERRITIN, TIBC, IRON, RETICCTPCT in the last 72 hours. Urinalysis    Component Value Date/Time   COLORURINE YELLOW 05/13/2019 0045   APPEARANCEUR HAZY (A) 05/13/2019 0045   LABSPEC 1.024 05/13/2019 0045   PHURINE 6.0 05/13/2019 0045   GLUCOSEU >=500 (A) 05/13/2019 0045   HGBUR LARGE (A) 05/13/2019 0045   BILIRUBINUR NEGATIVE 05/13/2019 0045   KETONESUR NEGATIVE 05/13/2019 0045   PROTEINUR 30 (A) 05/13/2019 0045   NITRITE NEGATIVE 05/13/2019 0045   LEUKOCYTESUR NEGATIVE 05/13/2019 0045   Sepsis Labs Invalid input(s): PROCALCITONIN,  WBC,  LACTICIDVEN Microbiology Recent Results (from the past 240 hour(s))  SARS CORONAVIRUS 2 (TAT 6-24 HRS) Nasopharyngeal Nasopharyngeal Swab     Status: None   Collection Time: 09/02/20  4:46 AM   Specimen: Nasopharyngeal Swab  Result Value Ref Range Status   SARS Coronavirus 2 NEGATIVE NEGATIVE Final    Comment: (NOTE) SARS-CoV-2 target nucleic acids are NOT  DETECTED.  The SARS-CoV-2 RNA is generally detectable in upper and lower respiratory specimens during the acute phase of infection. Negative results do not preclude SARS-CoV-2 infection, do not rule out co-infections with other pathogens, and should not be used as the sole basis for treatment or other patient management decisions. Negative results must be combined with clinical observations, patient history, and epidemiological information. The expected result is Negative.  Fact Sheet for Patients: SugarRoll.be  Fact Sheet for Healthcare Providers: https://www.woods-mathews.com/  This test is not yet approved or cleared by the [place] FDA and  has been authorized for detection and/or diagnosis of SARS-CoV-2 by FDA under an Emergency Use Authorization (EUA). This EUA will remain  in effect (meaning this test can be used) for the duration of the COVID-19 declaration under Se ction 564(b)(1) of the Act, 21 U.S.C. section 360bbb-3(b)(1), unless the authorization is terminated or revoked sooner.  Performed at [facility], [place] [address], [place], [place] [address]     Time coordinating discharge: Over 30 minutes  SIGNED:  Lorella Nimrod, MD  [facility] 09/03/2020, 10:03 AM  If 7PM-7AM, please contact night-coverage [email or web address]  This record has been created using Systems analyst. Errors have been sought and corrected,but may not always be located. Such creation errors do not reflect on the standard of care.

## 2020-09-03 NOTE — Plan of Care (Signed)
  Problem: Education: Goal: Understanding of cardiac disease, CV risk reduction, and recovery process will improve 09/03/2020 1038 by Rometta Emery, RN Outcome: Adequate for Discharge 09/03/2020 1038 by Rometta Emery, RN Outcome: Progressing Goal: Individualized Educational Video(s) 09/03/2020 1038 by Rometta Emery, RN Outcome: Adequate for Discharge 09/03/2020 1038 by Rometta Emery, RN Outcome: Progressing   Problem: Activity: Goal: Ability to tolerate increased activity will improve 09/03/2020 1038 by Rometta Emery, RN Outcome: Adequate for Discharge 09/03/2020 1038 by Rometta Emery, RN Outcome: Progressing   Problem: Cardiac: Goal: Ability to achieve and maintain adequate cardiovascular perfusion will improve 09/03/2020 1038 by Rometta Emery, RN Outcome: Adequate for Discharge 09/03/2020 1038 by Rometta Emery, RN Outcome: Progressing   Problem: Health Behavior/Discharge Planning: Goal: Ability to safely manage health-related needs after discharge will improve 09/03/2020 1038 by Rometta Emery, RN Outcome: Adequate for Discharge 09/03/2020 1038 by Rometta Emery, RN Outcome: Progressing

## 2020-12-10 ENCOUNTER — Ambulatory Visit: Payer: Self-pay | Admitting: *Deleted

## 2020-12-10 NOTE — Telephone Encounter (Signed)
Reason for Disposition  [1] Caller has URGENT medication or insulin pump question AND [2] triager unable to answer question  Answer Assessment - Initial Assessment Questions 1. BLOOD GLUCOSE: "What is your blood glucose level?"      215 2. ONSET: "When did you check the blood glucose?"    Lunch time- 2pm 3. USUAL RANGE: "What is your glucose level usually?" (e.g., usual fasting morning value, usual evening value)     Most days- high 200 or up 4. KETONES: "Do you check for ketones (urine or blood test strips)?" If yes, ask: "What does the test show now?"      N/a 5. TYPE 1 or 2:  "Do you know what type of diabetes you have?"  (e.g., Type 1, Type 2, Gestational; doesn't know)      Type 2 6. INSULIN: "Do you take insulin?" "What type of insulin(s) do you use? What is the mode of delivery? (syringe, pen; injection or pump)?"      Yes- Lantus and Humalog 7. DIABETES PILLS: "Do you take any pills for your diabetes?" If yes, ask: "Have you missed taking any pills recently?"     Yes- metformin- out of metformin 8. OTHER SYMPTOMS: "Do you have any symptoms?" (e.g., fever, frequent urination, difficulty breathing, dizziness, weakness, vomiting)     Yes- frequent urination, dizziness 9. PREGNANCY: "Is there any chance you are pregnant?" "When was your last menstrual period?"     No- can skip month- but last cycle 8/21  Protocols used: Diabetes - High Blood Sugar-A-AH

## 2020-12-10 NOTE — Telephone Encounter (Signed)
NP - calling to get advised on elevated glucose and medications. She is running out of medication- advised UC for bridge until appointment. Patient is on cancellation list. Will need sign language interpreter.

## 2021-01-26 NOTE — Progress Notes (Deleted)
There were no vitals taken for this visit.   Subjective:    Patient ID: Jane Schultz, female    DOB: [birth year], 40 y.o.   MRN: [id number]  HPI: Jane Schultz is a 40 y.o. female  No chief complaint on file.  Patient presents to clinic to establish care with new PCP.  Patient reports a history of ***. Patient denies a history of: Hypertension, Elevated Cholesterol, Diabetes, Thyroid problems, Depression, Anxiety, Neurological problems, and Abdominal problems.   Active Ambulatory Problems    Diagnosis Date Noted   Type 2 diabetes mellitus without complication ([place]) 35/70/1779   HTN (hypertension) 09/02/2020   Asthma 09/02/2020   Deaf 09/02/2020   Chest pain 09/02/2020   Resolved Ambulatory Problems    Diagnosis Date Noted   No Resolved Ambulatory Problems   Past Medical History:  Diagnosis Date   Diabetes mellitus without complication ([place])    Hypertension    Past Surgical History:  Procedure Laterality Date   CESAREAN SECTION     No family history on file.  Review of Systems  Per HPI unless specifically indicated above     Objective:    There were no vitals taken for this visit.  Wt Readings from Last 3 Encounters:  09/01/20 200 lb (90.7 kg)  06/25/20 200 lb (90.7 kg)  04/14/20 200 lb (90.7 kg)    Physical Exam  Results for orders placed or performed during the hospital encounter of 09/02/20  SARS CORONAVIRUS 2 (TAT 6-24 HRS) Nasopharyngeal Nasopharyngeal Swab   Specimen: Nasopharyngeal Swab  Result Value Ref Range   SARS Coronavirus 2 NEGATIVE NEGATIVE  Basic metabolic panel  Result Value Ref Range   Sodium 135 135 - 145 mmol/L   Potassium 4.0 3.5 - 5.1 mmol/L   Chloride 102 98 - 111 mmol/L   CO2 24 22 - 32 mmol/L   Glucose, Bld 274 (H) 70 - 99 mg/dL   BUN 13 6 - 20 mg/dL   Creatinine, Ser 0.77 0.44 - 1.00 mg/dL   Calcium 8.7 (L) 8.9 - 10.3 mg/dL   GFR, Estimated >60 >60 mL/min   Anion gap 9 5 - 15  CBC  Result Value  Ref Range   WBC 13.1 (H) 4.0 - 10.5 K/uL   RBC 5.06 3.87 - 5.11 MIL/uL   Hemoglobin 13.8 12.0 - 15.0 g/dL   HCT 41.7 36.0 - 46.0 %   MCV 82.4 80.0 - 100.0 fL   MCH 27.3 26.0 - 34.0 pg   MCHC 33.1 30.0 - 36.0 g/dL   RDW 14.0 11.5 - 15.5 %   Platelets 223 150 - 400 K/uL   nRBC 0.0 0.0 - 0.2 %  HIV Antibody (routine testing w rflx)  Result Value Ref Range   HIV Screen 4th Generation wRfx Non Reactive Non Reactive  Hemoglobin A1c  Result Value Ref Range   Hgb A1c MFr Bld 11.7 (H) 4.8 - 5.6 %   Mean Plasma Glucose 289.09 mg/dL  CBC  Result Value Ref Range   WBC 9.7 4.0 - 10.5 K/uL   RBC 4.97 3.87 - 5.11 MIL/uL   Hemoglobin 13.6 12.0 - 15.0 g/dL   HCT 41.5 36.0 - 46.0 %   MCV 83.5 80.0 - 100.0 fL   MCH 27.4 26.0 - 34.0 pg   MCHC 32.8 30.0 - 36.0 g/dL   RDW 13.9 11.5 - 15.5 %   Platelets 185 150 - 400 K/uL   nRBC 0.0 0.0 - 0.2 %  Creatinine, serum  Result  Value Ref Range   Creatinine, Ser 0.72 0.44 - 1.00 mg/dL   GFR, Estimated >60 >60 mL/min  Glucose, capillary  Result Value Ref Range   Glucose-Capillary 257 (H) 70 - 99 mg/dL  Glucose, capillary  Result Value Ref Range   Glucose-Capillary 178 (H) 70 - 99 mg/dL  Glucose, capillary  Result Value Ref Range   Glucose-Capillary 206 (H) 70 - 99 mg/dL   Comment 1 Notify RN    Comment 2 Document in Chart   POC urine preg, ED  Result Value Ref Range   Preg Test, Ur NEGATIVE NEGATIVE  CBG monitoring, ED  Result Value Ref Range   Glucose-Capillary 187 (H) 70 - 99 mg/dL  CBG monitoring, ED  Result Value Ref Range   Glucose-Capillary 160 (H) 70 - 99 mg/dL  Troponin I (High Sensitivity)  Result Value Ref Range   Troponin I (High Sensitivity) 6 <18 ng/L  Troponin I (High Sensitivity)  Result Value Ref Range   Troponin I (High Sensitivity) 25 (H) <18 ng/L  Troponin I (High Sensitivity)  Result Value Ref Range   Troponin I (High Sensitivity) 19 (H) <18 ng/L  Troponin I (High Sensitivity)  Result Value Ref Range   Troponin I  (High Sensitivity) 18 (H) <18 ng/L      Assessment & Plan:   Problem List Items Addressed This Visit       Cardiovascular and Mediastinum   HTN (hypertension) - Primary     Endocrine   Type 2 diabetes mellitus without complication ([place])   Other Visit Diagnoses     Encounter to establish care            Follow up plan: No follow-ups on file.

## 2021-01-27 ENCOUNTER — Ambulatory Visit: Payer: Medicaid Other | Admitting: Nurse Practitioner

## 2021-01-27 DIAGNOSIS — I10 Essential (primary) hypertension: Secondary | ICD-10-CM

## 2021-01-27 DIAGNOSIS — E119 Type 2 diabetes mellitus without complications: Secondary | ICD-10-CM

## 2021-01-27 DIAGNOSIS — Z7689 Persons encountering health services in other specified circumstances: Secondary | ICD-10-CM

## 2021-05-05 ENCOUNTER — Inpatient Hospital Stay
Admission: EM | Admit: 2021-05-05 | Discharge: 2021-05-14 | DRG: 746 | Disposition: A | Discharge status: Home / Self Care | Payer: Medicaid Other | Attending: Internal Medicine | Admitting: Internal Medicine

## 2021-05-05 DIAGNOSIS — I10 Essential (primary) hypertension: Secondary | ICD-10-CM | POA: Diagnosis present

## 2021-05-05 DIAGNOSIS — L03818 Cellulitis of other sites: Secondary | ICD-10-CM | POA: Diagnosis present

## 2021-05-05 DIAGNOSIS — H9190 Unspecified hearing loss, unspecified ear: Secondary | ICD-10-CM

## 2021-05-05 DIAGNOSIS — E11628 Type 2 diabetes mellitus with other skin complications: Secondary | ICD-10-CM | POA: Diagnosis present

## 2021-05-05 DIAGNOSIS — E871 Hypo-osmolality and hyponatremia: Secondary | ICD-10-CM | POA: Diagnosis present

## 2021-05-05 DIAGNOSIS — E876 Hypokalemia: Secondary | ICD-10-CM | POA: Diagnosis present

## 2021-05-05 DIAGNOSIS — K5900 Constipation, unspecified: Secondary | ICD-10-CM | POA: Diagnosis present

## 2021-05-05 DIAGNOSIS — N764 Abscess of vulva: Secondary | ICD-10-CM | POA: Diagnosis present

## 2021-05-05 DIAGNOSIS — L8990 Pressure ulcer of unspecified site, unspecified stage: Secondary | ICD-10-CM | POA: Insufficient documentation

## 2021-05-05 DIAGNOSIS — Z6831 Body mass index (BMI) 31.0-31.9, adult: Secondary | ICD-10-CM

## 2021-05-05 DIAGNOSIS — N762 Acute vulvitis: Principal | ICD-10-CM

## 2021-05-05 DIAGNOSIS — Z9049 Acquired absence of other specified parts of digestive tract: Secondary | ICD-10-CM

## 2021-05-05 DIAGNOSIS — R109 Unspecified abdominal pain: Secondary | ICD-10-CM

## 2021-05-05 DIAGNOSIS — L0291 Cutaneous abscess, unspecified: Secondary | ICD-10-CM

## 2021-05-05 DIAGNOSIS — Z7901 Long term (current) use of anticoagulants: Secondary | ICD-10-CM

## 2021-05-05 DIAGNOSIS — Z794 Long term (current) use of insulin: Secondary | ICD-10-CM

## 2021-05-05 DIAGNOSIS — E1165 Type 2 diabetes mellitus with hyperglycemia: Secondary | ICD-10-CM

## 2021-05-05 DIAGNOSIS — Z20822 Contact with and (suspected) exposure to covid-19: Secondary | ICD-10-CM | POA: Diagnosis present

## 2021-05-05 DIAGNOSIS — E279 Disorder of adrenal gland, unspecified: Secondary | ICD-10-CM | POA: Diagnosis present

## 2021-05-05 DIAGNOSIS — A419 Sepsis, unspecified organism: Secondary | ICD-10-CM

## 2021-05-05 DIAGNOSIS — R739 Hyperglycemia, unspecified: Secondary | ICD-10-CM

## 2021-05-05 DIAGNOSIS — H913 Deaf nonspeaking, not elsewhere classified: Secondary | ICD-10-CM | POA: Diagnosis present

## 2021-05-05 DIAGNOSIS — L89312 Pressure ulcer of right buttock, stage 2: Secondary | ICD-10-CM | POA: Diagnosis not present

## 2021-05-05 DIAGNOSIS — E119 Type 2 diabetes mellitus without complications: Secondary | ICD-10-CM

## 2021-05-05 LAB — URINALYSIS, ROUTINE W REFLEX MICROSCOPIC
Bilirubin Urine: NEGATIVE
Glucose, UA: >=500 mg/dL — AB
Ketones, ur: 20 mg/dL — AB
Leukocytes,Ua: NEGATIVE
Nitrite: NEGATIVE
Protein, ur: >=300 mg/dL — AB
Specific Gravity, Urine: 1.039 — ABNORMAL HIGH (ref 1.005–1.030)
pH: 5 (ref 5.0–8.0)

## 2021-05-05 LAB — COMPREHENSIVE METABOLIC PANEL
ALT: 30 U/L (ref 0–44)
AST: 36 U/L (ref 15–41)
Albumin: 3.5 g/dL (ref 3.5–5.0)
Alkaline Phosphatase: 79 U/L (ref 38–126)
Anion gap: 9 (ref 5–15)
BUN: 10 mg/dL (ref 6–20)
CO2: 21 mmol/L — ABNORMAL LOW (ref 22–32)
Calcium: 8.7 mg/dL — ABNORMAL LOW (ref 8.9–10.3)
Chloride: 101 mmol/L (ref 98–111)
Creatinine, Ser: 0.74 mg/dL (ref 0.44–1.00)
GFR, Estimated: >60 mL/min (ref >60)
Glucose, Bld: 326 mg/dL — ABNORMAL HIGH (ref 70–99)
Potassium: 3.3 mmol/L — ABNORMAL LOW (ref 3.5–5.1)
Sodium: 131 mmol/L — ABNORMAL LOW (ref 135–145)
Total Bilirubin: 0.8 mg/dL (ref 0.3–1.2)
Total Protein: 7.3 g/dL (ref 6.5–8.1)

## 2021-05-05 LAB — CBC WITH DIFFERENTIAL/PLATELET
Abs Immature Granulocytes: 0.11 10*3/uL — ABNORMAL HIGH (ref 0.00–0.07)
Basophils Absolute: 0.1 10*3/uL (ref 0.0–0.1)
Basophils Relative: 0 %
Eosinophils Absolute: 0 10*3/uL (ref 0.0–0.5)
Eosinophils Relative: 0 %
HCT: 40.1 % (ref 36.0–46.0)
Hemoglobin: 13.3 g/dL (ref 12.0–15.0)
Immature Granulocytes: 1 %
Lymphocytes Relative: 7 %
Lymphs Abs: 1.3 10*3/uL (ref 0.7–4.0)
MCH: 27.2 pg (ref 26.0–34.0)
MCHC: 33.2 g/dL (ref 30.0–36.0)
MCV: 82 fL (ref 80.0–100.0)
Monocytes Absolute: 1.1 10*3/uL — ABNORMAL HIGH (ref 0.1–1.0)
Monocytes Relative: 6 %
Neutro Abs: 15.6 10*3/uL — ABNORMAL HIGH (ref 1.7–7.7)
Neutrophils Relative %: 86 %
Platelets: 219 10*3/uL (ref 150–400)
RBC: 4.89 MIL/uL (ref 3.87–5.11)
RDW: 13.9 % (ref 11.5–15.5)
WBC: 18.1 10*3/uL — ABNORMAL HIGH (ref 4.0–10.5)
nRBC: 0 % (ref 0.0–0.2)

## 2021-05-05 LAB — POC URINE PREG, ED: Preg Test, Ur: NEGATIVE

## 2021-05-05 NOTE — ED Triage Notes (Signed)
Pt presents via POV c/o possible abscess to right buttocks and in groin area per pt report. Reports fever. Used ASL interpreter.

## 2021-05-06 ENCOUNTER — Encounter: Payer: Self-pay | Admitting: Internal Medicine

## 2021-05-06 ENCOUNTER — Emergency Department: Payer: Medicaid Other

## 2021-05-06 DIAGNOSIS — N764 Abscess of vulva: Secondary | ICD-10-CM | POA: Diagnosis present

## 2021-05-06 DIAGNOSIS — N762 Acute vulvitis: Secondary | ICD-10-CM | POA: Diagnosis present

## 2021-05-06 DIAGNOSIS — L89319 Pressure ulcer of right buttock, unspecified stage: Secondary | ICD-10-CM | POA: Diagnosis not present

## 2021-05-06 DIAGNOSIS — Z794 Long term (current) use of insulin: Secondary | ICD-10-CM | POA: Diagnosis not present

## 2021-05-06 DIAGNOSIS — E876 Hypokalemia: Secondary | ICD-10-CM | POA: Diagnosis present

## 2021-05-06 DIAGNOSIS — E1165 Type 2 diabetes mellitus with hyperglycemia: Secondary | ICD-10-CM

## 2021-05-06 DIAGNOSIS — Z20822 Contact with and (suspected) exposure to covid-19: Secondary | ICD-10-CM | POA: Diagnosis present

## 2021-05-06 DIAGNOSIS — Z7901 Long term (current) use of anticoagulants: Secondary | ICD-10-CM | POA: Diagnosis not present

## 2021-05-06 DIAGNOSIS — L89312 Pressure ulcer of right buttock, stage 2: Secondary | ICD-10-CM | POA: Diagnosis not present

## 2021-05-06 DIAGNOSIS — E871 Hypo-osmolality and hyponatremia: Secondary | ICD-10-CM | POA: Diagnosis present

## 2021-05-06 DIAGNOSIS — Z9049 Acquired absence of other specified parts of digestive tract: Secondary | ICD-10-CM | POA: Diagnosis not present

## 2021-05-06 DIAGNOSIS — I10 Essential (primary) hypertension: Secondary | ICD-10-CM | POA: Diagnosis present

## 2021-05-06 DIAGNOSIS — H913 Deaf nonspeaking, not elsewhere classified: Secondary | ICD-10-CM | POA: Diagnosis present

## 2021-05-06 DIAGNOSIS — E11628 Type 2 diabetes mellitus with other skin complications: Secondary | ICD-10-CM | POA: Diagnosis present

## 2021-05-06 DIAGNOSIS — E279 Disorder of adrenal gland, unspecified: Secondary | ICD-10-CM | POA: Diagnosis present

## 2021-05-06 DIAGNOSIS — Z6831 Body mass index (BMI) 31.0-31.9, adult: Secondary | ICD-10-CM | POA: Diagnosis not present

## 2021-05-06 DIAGNOSIS — R1032 Left lower quadrant pain: Secondary | ICD-10-CM | POA: Diagnosis present

## 2021-05-06 DIAGNOSIS — K5900 Constipation, unspecified: Secondary | ICD-10-CM | POA: Diagnosis present

## 2021-05-06 DIAGNOSIS — L03818 Cellulitis of other sites: Secondary | ICD-10-CM | POA: Diagnosis present

## 2021-05-06 DIAGNOSIS — A419 Sepsis, unspecified organism: Secondary | ICD-10-CM

## 2021-05-06 LAB — BASIC METABOLIC PANEL
Anion gap: 7 (ref 5–15)
BUN: 10 mg/dL (ref 6–20)
CO2: 21 mmol/L — ABNORMAL LOW (ref 22–32)
Calcium: 8 mg/dL — ABNORMAL LOW (ref 8.9–10.3)
Chloride: 103 mmol/L (ref 98–111)
Creatinine, Ser: 0.71 mg/dL (ref 0.44–1.00)
GFR, Estimated: >60 mL/min (ref >60)
Glucose, Bld: 323 mg/dL — ABNORMAL HIGH (ref 70–99)
Potassium: 3.5 mmol/L (ref 3.5–5.1)
Sodium: 131 mmol/L — ABNORMAL LOW (ref 135–145)

## 2021-05-06 LAB — CBC
HCT: 36.2 % (ref 36.0–46.0)
Hemoglobin: 12.3 g/dL (ref 12.0–15.0)
MCH: 28 pg (ref 26.0–34.0)
MCHC: 34 g/dL (ref 30.0–36.0)
MCV: 82.3 fL (ref 80.0–100.0)
Platelets: 179 10*3/uL (ref 150–400)
RBC: 4.4 MIL/uL (ref 3.87–5.11)
RDW: 13.8 % (ref 11.5–15.5)
WBC: 16.9 10*3/uL — ABNORMAL HIGH (ref 4.0–10.5)
nRBC: 0 % (ref 0.0–0.2)

## 2021-05-06 LAB — PROTIME-INR
INR: 1.2 (ref 0.8–1.2)
Prothrombin Time: 14.9 seconds (ref 11.4–15.2)

## 2021-05-06 LAB — HEMOGLOBIN A1C
Hgb A1c MFr Bld: 10.8 % — ABNORMAL HIGH (ref 4.8–5.6)
Mean Plasma Glucose: 263.26 mg/dL

## 2021-05-06 LAB — GLUCOSE, CAPILLARY
Glucose-Capillary: 274 mg/dL — ABNORMAL HIGH (ref 70–99)
Glucose-Capillary: 171 mg/dL — ABNORMAL HIGH (ref 70–99)
Glucose-Capillary: 223 mg/dL — ABNORMAL HIGH (ref 70–99)
Glucose-Capillary: 167 mg/dL — ABNORMAL HIGH (ref 70–99)

## 2021-05-06 LAB — PROCALCITONIN: Procalcitonin: 0.24 ng/mL

## 2021-05-06 LAB — RESP PANEL BY RT-PCR (FLU A&B, COVID) ARPGX2
Influenza A by PCR: NEGATIVE
Influenza B by PCR: NEGATIVE
SARS Coronavirus 2 by RT PCR: NEGATIVE

## 2021-05-06 LAB — LACTIC ACID, PLASMA
Lactic Acid, Venous: 1 mmol/L (ref 0.5–1.9)
Lactic Acid, Venous: 1.1 mmol/L (ref 0.5–1.9)

## 2021-05-06 LAB — CORTISOL-AM, BLOOD: Cortisol - AM: 11 ug/dL (ref 6.7–22.6)

## 2021-05-06 MED ORDER — VANCOMYCIN HCL IN DEXTROSE 1-5 GM/200ML-% IV SOLN
1000.0000 mg | Freq: Once | INTRAVENOUS | Status: AC
  Administered 2021-05-06: 1000 mg via INTRAVENOUS
  Filled 2021-05-06: qty 200

## 2021-05-06 MED ORDER — MORPHINE SULFATE (PF) 2 MG/ML IV SOLN
2.0000 mg | INTRAVENOUS | Status: DC | PRN
Start: 2021-05-06 — End: 2021-05-07
  Administered 2021-05-07 (×2): 2 mg via INTRAVENOUS
  Filled 2021-05-06 (×3): qty 1

## 2021-05-06 MED ORDER — ATORVASTATIN CALCIUM 20 MG PO TABS
20.0000 mg | ORAL_TABLET | Freq: Every day | ORAL | Status: DC
  Administered 2021-05-06 – 2021-05-14 (×9): 20 mg via ORAL
  Filled 2021-05-06 (×9): qty 1

## 2021-05-06 MED ORDER — IOHEXOL 300 MG/ML  SOLN
100.0000 mL | Freq: Once | INTRAMUSCULAR | Status: AC | PRN
  Administered 2021-05-06: 100 mL via INTRAVENOUS

## 2021-05-06 MED ORDER — ACETAMINOPHEN 650 MG RE SUPP: 650.0000 mg | Freq: Four times a day (QID) | RECTAL | Status: DC | PRN

## 2021-05-06 MED ORDER — ALPRAZOLAM 0.5 MG PO TABS: 0.5000 mg | ORAL_TABLET | Freq: Three times a day (TID) | ORAL | Status: DC | PRN

## 2021-05-06 MED ORDER — ACETAMINOPHEN 325 MG PO TABS
650.0000 mg | ORAL_TABLET | Freq: Four times a day (QID) | ORAL | Status: DC | PRN
  Administered 2021-05-08 – 2021-05-12 (×9): 650 mg via ORAL
  Filled 2021-05-06 (×10): qty 2

## 2021-05-06 MED ORDER — LACTATED RINGERS IV SOLN: INTRAVENOUS | Status: AC

## 2021-05-06 MED ORDER — SODIUM CHLORIDE 0.9 % IV BOLUS
1000.0000 mL | Freq: Once | INTRAVENOUS | Status: AC
  Administered 2021-05-06: 1000 mL via INTRAVENOUS

## 2021-05-06 MED ORDER — INSULIN ASPART 100 UNIT/ML IJ SOLN
0.0000 [IU] | Freq: Three times a day (TID) | INTRAMUSCULAR | Status: DC
  Administered 2021-05-06: 11 [IU] via SUBCUTANEOUS
  Administered 2021-05-06: 13:00:00 4 [IU] via SUBCUTANEOUS
  Administered 2021-05-06 – 2021-05-07 (×2): 7 [IU] via SUBCUTANEOUS
  Administered 2021-05-07 (×2): 3 [IU] via SUBCUTANEOUS
  Administered 2021-05-08: 14:00:00 7 [IU] via SUBCUTANEOUS
  Administered 2021-05-08 – 2021-05-09 (×3): 4 [IU] via SUBCUTANEOUS
  Administered 2021-05-09: 13:00:00 7 [IU] via SUBCUTANEOUS
  Administered 2021-05-09 – 2021-05-10 (×3): 4 [IU] via SUBCUTANEOUS
  Administered 2021-05-10: 14:00:00 7 [IU] via SUBCUTANEOUS
  Administered 2021-05-11: 12:00:00 4 [IU] via SUBCUTANEOUS
  Administered 2021-05-11 (×2): 3 [IU] via SUBCUTANEOUS
  Administered 2021-05-12 – 2021-05-13 (×3): 4 [IU] via SUBCUTANEOUS
  Administered 2021-05-14: 7 [IU] via SUBCUTANEOUS
  Administered 2021-05-14: 11 [IU] via SUBCUTANEOUS
  Filled 2021-05-06 (×23): qty 1

## 2021-05-06 MED ORDER — INSULIN GLARGINE-YFGN 100 UNIT/ML ~~LOC~~ SOLN
30.0000 [IU] | Freq: Once | SUBCUTANEOUS | Status: AC
  Administered 2021-05-06: 30 [IU] via SUBCUTANEOUS
  Filled 2021-05-06: qty 0.3

## 2021-05-06 MED ORDER — VANCOMYCIN HCL 1250 MG/250ML IV SOLN
1250.0000 mg | Freq: Once | INTRAVENOUS | Status: AC
  Administered 2021-05-06: 1250 mg via INTRAVENOUS
  Filled 2021-05-06 (×2): qty 250

## 2021-05-06 MED ORDER — LISINOPRIL 10 MG PO TABS
10.0000 mg | ORAL_TABLET | Freq: Every day | ORAL | Status: DC
  Administered 2021-05-06 – 2021-05-12 (×7): 10 mg via ORAL
  Filled 2021-05-06 (×7): qty 1

## 2021-05-06 MED ORDER — ENOXAPARIN SODIUM 60 MG/0.6ML IJ SOSY
0.5000 mg/kg | PREFILLED_SYRINGE | INTRAMUSCULAR | Status: DC
  Administered 2021-05-07 – 2021-05-14 (×8): 47.5 mg via SUBCUTANEOUS
  Filled 2021-05-06 (×9): qty 0.47

## 2021-05-06 MED ORDER — ENOXAPARIN SODIUM 40 MG/0.4ML IJ SOSY: 40.0000 mg | PREFILLED_SYRINGE | INTRAMUSCULAR | Status: DC

## 2021-05-06 MED ORDER — KETOROLAC TROMETHAMINE 30 MG/ML IJ SOLN
30.0000 mg | Freq: Four times a day (QID) | INTRAMUSCULAR | Status: AC | PRN
  Administered 2021-05-06 – 2021-05-10 (×10): 30 mg via INTRAVENOUS
  Filled 2021-05-06 (×11): qty 1

## 2021-05-06 MED ORDER — ONDANSETRON HCL 4 MG/2ML IJ SOLN
4.0000 mg | Freq: Four times a day (QID) | INTRAMUSCULAR | Status: DC | PRN
  Administered 2021-05-07 – 2021-05-13 (×6): 4 mg via INTRAVENOUS
  Filled 2021-05-06 (×8): qty 2

## 2021-05-06 MED ORDER — SODIUM CHLORIDE 0.9 % IV SOLN
2.0000 g | INTRAVENOUS | Status: DC
  Administered 2021-05-07 – 2021-05-11 (×5): 2 g via INTRAVENOUS
  Filled 2021-05-06: qty 2
  Filled 2021-05-06 (×3): qty 20
  Filled 2021-05-06 (×3): qty 2

## 2021-05-06 MED ORDER — CYCLOBENZAPRINE HCL 5 MG PO TABS
5.0000 mg | ORAL_TABLET | Freq: Three times a day (TID) | ORAL | Status: DC | PRN
  Filled 2021-05-06: qty 0.5

## 2021-05-06 MED ORDER — ACETAMINOPHEN 500 MG PO TABS
1000.0000 mg | ORAL_TABLET | Freq: Once | ORAL | Status: AC
  Administered 2021-05-06: 1000 mg via ORAL
  Filled 2021-05-06: qty 2

## 2021-05-06 MED ORDER — HYDROCODONE-ACETAMINOPHEN 5-325 MG PO TABS
1.0000 | ORAL_TABLET | ORAL | Status: DC | PRN
  Administered 2021-05-06 – 2021-05-14 (×11): 2 via ORAL
  Filled 2021-05-06 (×13): qty 2

## 2021-05-06 MED ORDER — ONDANSETRON HCL 4 MG PO TABS
4.0000 mg | ORAL_TABLET | Freq: Four times a day (QID) | ORAL | Status: DC | PRN
  Administered 2021-05-06 – 2021-05-08 (×2): 4 mg via ORAL
  Filled 2021-05-06: qty 1

## 2021-05-06 MED ORDER — INSULIN ASPART 100 UNIT/ML IJ SOLN
0.0000 [IU] | Freq: Every day | INTRAMUSCULAR | Status: DC
  Administered 2021-05-10: 23:00:00 2 [IU] via SUBCUTANEOUS
  Administered 2021-05-14: 3 [IU] via SUBCUTANEOUS
  Filled 2021-05-06 (×2): qty 1

## 2021-05-06 MED ORDER — VANCOMYCIN HCL IN DEXTROSE 1-5 GM/200ML-% IV SOLN
1000.0000 mg | Freq: Two times a day (BID) | INTRAVENOUS | Status: DC
  Administered 2021-05-06 – 2021-05-10 (×8): 1000 mg via INTRAVENOUS
  Filled 2021-05-06 (×9): qty 200

## 2021-05-06 MED ORDER — SODIUM CHLORIDE 0.9 % IV SOLN
2.0000 g | Freq: Once | INTRAVENOUS | Status: AC
  Administered 2021-05-06: 2 g via INTRAVENOUS
  Filled 2021-05-06: qty 20

## 2021-05-06 NOTE — Consult Note (Signed)
Consult Note  Requesting Attending Physician :  Jennye Boroughs, MD Service Requesting Consult : @IPSERVICE @  Assessment/Recommendations: Vulvar/mons cellulitis. -Currently symptomatically improved with IV antibiotics Type 2 diabetes with hyperglycemia.  PLAN : Continue intravenous antibiotics until a significant decrease in WBC is noted as well as patient being afebrile for greater than 24 hours. Plan continued efforts at glycemic control as inpatient and outpatient. Continue oral antibiotics for 2 to 3 weeks or until complete resolution of edema and pain.  Recommend Bactrim DS and Flagyl in combination for cellulitis of the labia.  Principal Problem:   Cellulitis of labia majora Active Problems:   HTN (hypertension)   Deaf   Sepsis ([place])   Hyperglycemia due to type 2 diabetes mellitus ([place])   Jane Schultz is a 41 y.o. female seen in consultation at the request of Jennye Boroughs, MD regarding suspected vulvar cellulitis. Patient presented to the emergency department with complaint of several day history of labial pain and swelling.  She denies vaginal bleeding during that time.  She reports that she has not been febrile. The patient has not been receiving intravenous antibiotics and pain relief. Patient states that her pain is improving.   Social History: Social History   Socioeconomic History   Marital status: Divorced    Spouse name: Not on file   Number of children: Not on file   Years of education: Not on file   Highest education level: Not on file  Occupational History   Not on file  Tobacco Use   Smoking status: Never   Smokeless tobacco: Never  Vaping Use   Vaping Use: Former  Substance and Sexual Activity   Alcohol use: Never   Drug use: Yes    Types: Marijuana   Sexual activity: Yes    Birth control/protection: None  Other Topics Concern   Not on file  Social History Narrative   Not on file   Social Determinants of Health    Financial Resource Strain: Not on file  Food Insecurity: Not on file  Transportation Needs: Not on file  Physical Activity: Not on file  Stress: Not on file  Social Connections: Not on file    Family History: family history is not on file.  Review of Systems: See review from ED.   Vital signs in last 24 hours: Temp:  [99 F (37.2 C)-100.9 F (38.3 C)] 99 F (37.2 C) (01/19 1203) Pulse Rate:  [91-107] 100 (01/19 1203) Resp:  [14-20] 18 (01/19 1203) BP: (125-154)/(49-98) 125/78 (01/19 1203) SpO2:  [97 %-100 %] 99 % (01/19 1203) Weight:  [92.5 kg] 92.5 kg (01/19 0256)  Intake/Output last 3 shifts: I/O last 3 completed shifts: In: 1294.2 [I.V.:7.7; IV Piggyback:1286.5] Out: -    Physical Exam:  Objective : Left labial edema but most edema and pain seems to be concentrated in the mons.  No obvious abscess currently present.  See CT findings below.   Test Results  Imaging:  CT: Moderate severity subcutaneous inflammatory fat stranding is seen along the vulva on the left. This extends superiorly to the inferior aspect of the left inguinal region.   A 2.2 cm x 1.2 cm area of homogeneous low attenuation (approximately 55.50 Hounsfield units) is noted slightly to the right of midline along the posterior aspect of the vulva on the right. No associated inflammatory fat stranding is clearly identified within the vulva on the right.   No abdominopelvic ascites.   Musculoskeletal: No acute or significant osseous findings.  IMPRESSION: 1. Moderate to marked severity cellulitis along the vulva on the left, extending superiorly to the inferior aspect of the left inguinal region. 2. 2.2 cm x 1.2 cm area of homogeneous low attenuation along the posterior aspect of the vulva on the right. This may represent a small abscess. 3. 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 4. Stable diffuse enlargement of the left adrenal gland. 5. Evidence of  prior cholecystectomy.   I spent 35 minutes involved in the care of this patient preparing to see the patient by obtaining and reviewing her medical history (including labs, imaging tests and prior procedures), documenting clinical information in the electronic health record (EHR), counseling and coordinating care plans, writing and sending prescriptions, ordering tests or procedures and in direct communicating with the patient and medical staff discussing pertinent items from her history and physical exam.  Finis Bud, M.D. 05/06/2021 3:02 PM

## 2021-05-06 NOTE — H&P (Signed)
History and Physical    Jane Schultz UXN:[id number] DOB: [birth year] DOA: 05/05/2021  PCP: Patient, No Pcp Per (Inactive)   Patient coming from: home  I have personally briefly reviewed patient's relevant medical records in [place]  Chief Complaint: pain left groin  HPI: Jane Schultz is a 41 y.o. female with medical history significant for DM, HTN, asthma, deaf mutism who presents to the ED with pain along the left labia present for the past few days, worse with sitting.  Denies fever or chills.  Has no vaginal bleeding or dysuria  ED course: T-max 100.9, tachycardic to 107, BP 154/88 Blood work: WBC 18,000 with lactic acid 1.0 Sodium 131, potassium 3.3, glucose 326 with anion gap of 9 and bicarb 21 COVID and flu negative  CT abdomen and pelvis with contrast: Marked severity cellulitis along the vulvar on the left extending superiorly to the inferior aspect of the left inguinal region Possible small abscess valve on the right Other nonacute findings.  Please refer to report  Patient started on ceftriaxone and vancomycin.  Hospitalist consulted for admission.   Review of Systems: As per HPI otherwise all other systems on review of systems negative.   Assessment/Plan    Cellulitis of labia majora and vulva   Sepsis (HCC) - Continue Rocephin and vancomycin started in the ED -IV fluids - Pain control - Consider GYN consult in the a.m.  Hyperglycemia in type 2 diabetes mellitus without complication  -Basal insulin with sliding scale coverage    HTN (hypertension) - Continue home lisinopril    Deaf - Interpreter services needed   DVT prophylaxis: Lovenox  Code Status: full code  Family Communication:  none  Disposition Plan: Back to previous home environment Consults called: none  Status:At the time of admission, it appears that the appropriate admission status for this patient is INPATIENT. This is judged to be reasonable and necessary  in order to provide the required intensity of service to ensure the patient's safety given the presenting symptoms, physical exam findings, and initial radiographic and laboratory data in the context of their  Comorbid conditions.   Patient requires inpatient status due to high intensity of service, high risk for further deterioration and high frequency of surveillance required.   I certify that at the point of admission it is my clinical judgment that the patient will require inpatient hospital care spanning beyond 2 midnights     Physical Exam: Vitals:   05/05/21 2026 05/05/21 2315 05/06/21 0236 05/06/21 0238  BP: (!) 154/88 (!) 148/66 (!) 131/49   Pulse:  (!) 101 91   Resp:  15 16   Temp:    99.2 F (37.3 C)  TempSrc:    Oral  SpO2:  100% 100%    Constitutional: Alert, oriented x 3 . Not in any apparent distress HEENT:      Head: Normocephalic and atraumatic.         Eyes: PERLA, EOMI, Conjunctivae are normal. Sclera is non-icteric.       Mouth/Throat: Mucous membranes are moist.       Neck: Supple with no signs of meningismus. Cardiovascular: Regular rate and rhythm. No murmurs, gallops, or rubs. 2+ symmetrical distal pulses are present . No JVD. No  LE edema Respiratory: Respiratory effort normal .Lungs sounds clear bilaterally. No wheezes, crackles, or rhonchi.  Gastrointestinal: Soft, non tender, non distended. Positive bowel sounds.  Genitourinary: Deferred to Gyn Musculoskeletal: Nontender with normal range of motion in all extremities. No cyanosis, or  erythema of extremities. Neurologic:  Face is symmetric. Moving all extremities. No gross focal neurologic deficits . Skin: Skin is warm, dry.  No rash or ulcers Psychiatric: Mood and affect are appropriate     Past Medical History:  Diagnosis Date   Asthma    Deaf    Diabetes mellitus without complication ([place])    Hypertension     Past Surgical History:  Procedure Laterality Date   CESAREAN SECTION        reports that she has never smoked. She has never used smokeless tobacco. She reports current drug use. Drug: Marijuana. She reports that she does not drink alcohol.  No Known Allergies  No family history on file.    Prior to Admission medications   Medication Sig Start Date End Date Taking? Authorizing Provider  ALPRAZolam Duanne Moron) 0.5 MG tablet Take 1 tablet (0.5 mg total) by mouth 3 (three) times daily as needed for sleep or anxiety. 09/03/20 09/03/21  Lorella Nimrod, MD  aspirin EC 81 MG tablet Take 81 mg by mouth daily. Patient not taking: No sig reported    [provider]  atorvastatin (LIPITOR) 20 MG tablet Take 1 tablet (20 mg total) by mouth daily. 09/03/20 09/03/21  Lorella Nimrod, MD  blood glucose meter kit and supplies KIT Dispense based on patient and insurance preference. Use up to four times daily as directed. (FOR ICD-9 250.00, 250.01). 05/13/19   Larene Pickett, PA-C  cyclobenzaprine (FLEXERIL) 5 MG tablet Take 1 tablet (5 mg total) by mouth 3 (three) times daily as needed (muscle pain). 06/25/20   Nance Pear, MD  insulin glargine (LANTUS) 100 UNIT/ML injection Inject 10-60 Units into the skin See admin instructions. Use 10 units every morning and use 60 units at bedtime Patient not taking: No sig reported    [provider]  insulin lispro (HUMALOG) 100 UNIT/ML injection Inject 20 Units into the skin daily with lunch. Patient not taking: No sig reported    [provider]  lisinopril (ZESTRIL) 10 MG tablet Take 1 tablet (10 mg total) by mouth daily. 09/03/20   Lorella Nimrod, MD  METFORMIN HCL PO Take 2 tablets by mouth daily. Patient not taking: No sig reported    [provider]  metoprolol tartrate (LOPRESSOR) 25 MG tablet Take 0.5 tablets (12.5 mg total) by mouth 2 (two) times daily. 09/03/20   Lorella Nimrod, MD      Labs on Admission: I have personally reviewed following labs and imaging studies  CBC: Recent Labs  Lab  05/05/21 2025  WBC 18.1*  NEUTROABS 15.6*  HGB 13.3  HCT 40.1  MCV 82.0  PLT 462   Basic Metabolic Panel: Recent Labs  Lab 05/05/21 2025  NA 131*  K 3.3*  CL 101  CO2 21*  GLUCOSE 326*  BUN 10  CREATININE 0.74  CALCIUM 8.7*   GFR: CrCl cannot be calculated (Unknown ideal weight.). Liver Function Tests: Recent Labs  Lab 05/05/21 2025  AST 36  ALT 30  ALKPHOS 79  BILITOT 0.8  PROT 7.3  ALBUMIN 3.5   No results for input(s): LIPASE, AMYLASE in the last 168 hours. No results for input(s): AMMONIA in the last 168 hours. Coagulation Profile: No results for input(s): INR, PROTIME in the last 168 hours. Cardiac Enzymes: No results for input(s): CKTOTAL, CKMB, CKMBINDEX, TROPONINI in the last 168 hours. BNP (last 3 results) No results for input(s): PROBNP in the last 8760 hours. HbA1C: No results for input(s): HGBA1C in the  last 72 hours. CBG: No results for input(s): GLUCAP in the last 168 hours. Lipid Profile: No results for input(s): CHOL, HDL, LDLCALC, TRIG, CHOLHDL, LDLDIRECT in the last 72 hours. Thyroid Function Tests: No results for input(s): TSH, T4TOTAL, FREET4, T3FREE, THYROIDAB in the last 72 hours. Anemia Panel: No results for input(s): VITAMINB12, FOLATE, FERRITIN, TIBC, IRON, RETICCTPCT in the last 72 hours. Urine analysis:    Component Value Date/Time   COLORURINE YELLOW (A) 05/05/2021 2342   APPEARANCEUR HAZY (A) 05/05/2021 2342   LABSPEC 1.039 (H) 05/05/2021 2342   PHURINE 5.0 05/05/2021 2342   GLUCOSEU >=500 (A) 05/05/2021 2342   HGBUR LARGE (A) 05/05/2021 2342   BILIRUBINUR NEGATIVE 05/05/2021 2342   KETONESUR 20 (A) 05/05/2021 2342   PROTEINUR >=300 (A) 05/05/2021 2342   NITRITE NEGATIVE 05/05/2021 2342   LEUKOCYTESUR NEGATIVE 05/05/2021 2342    Radiological Exams on Admission: CT ABDOMEN PELVIS W CONTRAST  Result Date: 05/06/2021 CLINICAL DATA:  Evaluate for possible abscess to the right buttocks and groin. EXAM: CT ABDOMEN AND  PELVIS WITH CONTRAST TECHNIQUE: Multidetector CT imaging of the abdomen and pelvis was performed using the standard protocol following bolus administration of intravenous contrast. RADIATION DOSE REDUCTION: This exam was performed according to the departmental dose-optimization program which includes automated exposure control, adjustment of the mA and/or kV according to patient size and/or use of iterative reconstruction technique. CONTRAST:  127m OMNIPAQUE IOHEXOL 300 MG/ML  SOLN COMPARISON:  Sep 10, 2018 FINDINGS: Lower chest: No acute abnormality. Hepatobiliary: No focal liver abnormality is seen. Status post cholecystectomy. No biliary dilatation. Pancreas: Unremarkable. No pancreatic ductal dilatation or surrounding inflammatory changes. Spleen: Normal in size without focal abnormality. Adrenals/Urinary Tract: A stable 3.6 cm x 1.1 cm homogeneous low-attenuation (approximately 47.86 Hounsfield units) right adrenal mass is seen. Mild, stable diffuse enlargement of the left adrenal gland is seen. Kidneys are normal, without renal calculi, focal lesion, or hydronephrosis. Bladder is unremarkable. Stomach/Bowel: Stomach is within normal limits. Appendix appears normal. No evidence of bowel wall thickening, distention, or inflammatory changes. Vascular/Lymphatic: No significant vascular findings are present. There is mild bilateral inguinal lymphadenopathy. Reproductive: Uterus and bilateral adnexa are unremarkable. Other: Moderate severity subcutaneous inflammatory fat stranding is seen along the vulva on the left. This extends superiorly to the inferior aspect of the left inguinal region. A 2.2 cm x 1.2 cm area of homogeneous low attenuation (approximately 55.50 Hounsfield units) is noted slightly to the right of midline along the posterior aspect of the vulva on the right. No associated inflammatory fat stranding is clearly identified within the vulva on the right. No abdominopelvic ascites. Musculoskeletal: No  acute or significant osseous findings. IMPRESSION: 1. Moderate to marked severity cellulitis along the vulva on the left, extending superiorly to the inferior aspect of the left inguinal region. 2. 2.2 cm x 1.2 cm area of homogeneous low attenuation along the posterior aspect of the vulva on the right. This may represent a small abscess. 3. 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 4. Stable diffuse enlargement of the left adrenal gland. 5. Evidence of prior cholecystectomy. Electronically Signed   By: TVirgina NorfolkM.D.   On: 05/06/2021 01:52       HAthena MasseMD [facility]   05/06/2021, 2:50 AM

## 2021-05-06 NOTE — Progress Notes (Signed)
Pharmacy Antibiotic Note  Riya Haselton is a 41 y.o. female admitted on 05/05/2021 with cellulitis.  Pharmacy has been consulted for Vancomycin dosing.  Plan: Vancomycin 1 gm IV X given on 1/19 @ 0232. Additional vancomycin 1250 mg IV X 1 ordered to make total loading dose of 2250 mg.   AUC = 536.4 Vanc trough = 14.4   Height: 5\' 7"  (170.2 cm) Weight: 92.5 kg (204 lb) IBW/kg (Calculated) : 61.6  Temp (24hrs), Avg:100.1 F (37.8 C), Min:99.2 F (37.3 C), Max:100.9 F (38.3 C)  Recent Labs  Lab 05/05/21 2025 05/06/21 0031  WBC 18.1*  --   CREATININE 0.74  --   LATICACIDVEN  --  1.0    Estimated Creatinine Clearance: 110.3 mL/min (by C-G formula based on SCr of 0.74 mg/dL).    No Known Allergies  Antimicrobials this admission:   >>    >>   Dose adjustments this admission:   Microbiology results:  BCx:   UCx:    Sputum:    MRSA PCR:   Thank you for allowing pharmacy to be a part of this patients care.  Zaylei Mullane D 05/06/2021 3:44 AM

## 2021-05-06 NOTE — Progress Notes (Signed)
CODE SEPSIS - PHARMACY COMMUNICATION  **Broad Spectrum Antibiotics should be administered within 1 hour of Sepsis diagnosis**  Time Code Sepsis Called/Page Received:  1/19 @ 0217  Antibiotics Ordered: Vancomycin, Ceftriaxone   Time of 1st antibiotic administration: Ceftriaxone 2 gm IV X 1 on 1/19 @ 0052   Additional action taken by pharmacy:   If necessary, Name of Provider/Nurse Contacted:     Adelise Buswell D ,PharmD Clinical Pharmacist  05/06/2021  2:25 AM

## 2021-05-06 NOTE — ED Provider Notes (Signed)
[facility] Provider Note    Event Date/Time   First MD Initiated Contact with Patient 05/06/21 0003     (approximate)   History   Abscess   HPI  Jane Schultz is a 41 y.o. female with diabetes, deaf who comes in with concern for abscess.  Patient reports having an abscess that formed about a week ago.  She has a small wound on her buttock that does not seem to be much of a concern but her bigger issue is her left labial pain.  She reports more pain with sitting.  She reports having infections previously.  States that she typically will get a dose of IV antibiotics and be able to go home.  She does report having fevers that started yesterday.  Denies having an infection in the vaginal area before.    Interpreter was used for history and updates  Physical Exam   Triage Vital Signs: ED Triage Vitals  Enc Vitals Group     BP 05/05/21 2026 (!) 154/88     Pulse Rate 05/05/21 2022 (!) 107     Resp 05/05/21 2022 14     Temp 05/05/21 2022 (!) 100.9 F (38.3 C)     Temp Source 05/05/21 2022 Oral     SpO2 05/05/21 2022 97 %     Weight --      Height --      Head Circumference --      Peak Flow --      Pain Score --      Pain Loc --      Pain Edu? --      Excl. in [place]? --     Most recent vital signs: Vitals:   05/05/21 2026 05/05/21 2315  BP: (!) 154/88 (!) 148/66  Pulse:  (!) 101  Resp:  15  Temp:    SpO2:  100%     General: Awake, no distress.  CV:  Good peripheral perfusion.  Slightly tachycardic Resp:  Normal effort.  No increased work of breathing Abd:  No distention.  Soft and nontender Other:  Patient is currently menstruating.  She is got some fullness in her left labia.  Does not seem to go into the vaginal vault.   ED Results / Procedures / Treatments   Labs (all labs ordered are listed, but only abnormal results are displayed) Labs Reviewed  COMPREHENSIVE METABOLIC PANEL - Abnormal; Notable for the following  components:      Result Value   Sodium 131 (*)    Potassium 3.3 (*)    CO2 21 (*)    Glucose, Bld 326 (*)    Calcium 8.7 (*)    All other components within normal limits  CBC WITH DIFFERENTIAL/PLATELET - Abnormal; Notable for the following components:   WBC 18.1 (*)    Neutro Abs 15.6 (*)    Monocytes Absolute 1.1 (*)    Abs Immature Granulocytes 0.11 (*)    All other components within normal limits  URINALYSIS, ROUTINE W REFLEX MICROSCOPIC - Abnormal; Notable for the following components:   Color, Urine YELLOW (*)    APPearance HAZY (*)    Specific Gravity, Urine 1.039 (*)    Glucose, UA >=500 (*)    Hgb urine dipstick LARGE (*)    Ketones, ur 20 (*)    Protein, ur >=300 (*)    Bacteria, UA RARE (*)    All other components within normal limits  POC URINE PREG, ED  RADIOLOGY I have reviewed the CT personally and agree with radiology.  Patient has cellulitis along the vulva.  Possible area of abscess on the right vulva   PROCEDURES:  Critical Care performed: Yes, see critical care procedure note(s)  .Critical Care Performed by: Vanessa Brutus, MD Authorized by: Vanessa Louisa, MD   Critical care provider statement:    Critical care time (minutes):  30   Critical care was necessary to treat or prevent imminent or life-threatening deterioration of the following conditions:  Sepsis   Critical care was time spent personally by me on the following activities:  Development of treatment plan with patient or surrogate, discussions with consultants, evaluation of patient's response to treatment, examination of patient, ordering and review of laboratory studies, ordering and review of radiographic studies, ordering and performing treatments and interventions, pulse oximetry, re-evaluation of patient's condition and review of old [place] ED: Medications  insulin aspart (novoLOG) injection 0-20 Units (has no administration in time range)  insulin aspart  (novoLOG) injection 0-5 Units (has no administration in time range)  lactated ringers infusion (has no administration in time range)  cefTRIAXone (ROCEPHIN) 2 g in sodium chloride 0.9 % 100 mL IVPB (has no administration in time range)  acetaminophen (TYLENOL) tablet 650 mg (has no administration in time range)    Or  acetaminophen (TYLENOL) suppository 650 mg (has no administration in time range)  ondansetron (ZOFRAN) tablet 4 mg (has no administration in time range)    Or  ondansetron (ZOFRAN) injection 4 mg (has no administration in time range)  HYDROcodone-acetaminophen (NORCO/VICODIN) 5-325 MG per tablet 1-2 tablet (has no administration in time range)  ketorolac (TORADOL) 30 MG/ML injection 30 mg (has no administration in time range)  morphine 2 MG/ML injection 2 mg (has no administration in time range)  enoxaparin (LOVENOX) injection 47.5 mg (has no administration in time range)  atorvastatin (LIPITOR) tablet 20 mg (has no administration in time range)  ALPRAZolam (XANAX) tablet 0.5 mg (has no administration in time range)  cyclobenzaprine (FLEXERIL) tablet 5 mg (has no administration in time range)  lisinopril (ZESTRIL) tablet 10 mg (has no administration in time range)  vancomycin (VANCOREADY) IVPB 1250 mg/250 mL (has no administration in time range)  vancomycin (VANCOCIN) IVPB 1000 mg/200 mL premix (has no administration in time range)  sodium chloride 0.9 % bolus 1,000 mL (1,000 mLs Intravenous New Bag/Given 05/06/21 0051)  acetaminophen (TYLENOL) tablet 1,000 mg (1,000 mg Oral Given 05/06/21 0049)  cefTRIAXone (ROCEPHIN) 2 g in sodium chloride 0.9 % 100 mL IVPB (0 g Intravenous Stopped 05/06/21 0122)  iohexol (OMNIPAQUE) 300 MG/ML solution 100 mL (100 mLs Intravenous Contrast Given 05/06/21 0122)  vancomycin (VANCOCIN) IVPB 1000 mg/200 mL premix (1,000 mg Intravenous New Bag/Given 05/06/21 0232)     IMPRESSION / MDM / [place] / ED COURSE  I reviewed the triage vital  signs and the nursing notes.  Patient who is diabetic therefore high risk for infection given diabetic who comes in with concerns for fevers, tachycardia and labial pain Differential diagnosis includes, but is not limited to, abscess, cellulitis, does not seem to be consistent with Bartholin cyst.  Patient was noted to be febrile and tachycardic therefore met sepsis criteria.  Blood cultures, lactate were ordered.  Sepsis alert was called  Patient's pregnancy test was negative.  Her urine has significant amount of protein in it but unlikely to represent UTI.  Her sugars are elevated at 326 but normal  anion gap therefore unlikely DKA.  Her white count is significantly elevated at 18.  CT imaging without evidence of abscess on the left side looks more like cellulitis.  On the right side they are calling a possible small abscess.  I reexamined her I do not feel any area of fluctuation I am not sure what they are referring to.  Patient was started on IV vancomycin and IV ceftriaxone  Given the concern for cellulitis in a diabetic patient meeting sepsis criteria we will discussed with the hospital team for admission.    FINAL CLINICAL IMPRESSION(S) / ED DIAGNOSES   Final diagnoses:  Sepsis, due to unspecified organism, unspecified whether acute organ dysfunction present ([place])  Cellulitis of other specified site  Hyperglycemia     Rx / DC Orders   ED Discharge Orders     None        Note:  This document was prepared using Dragon voice recognition software and may include unintentional dictation errors.   Vanessa Tama, MD 05/06/21 [phone]

## 2021-05-06 NOTE — Progress Notes (Addendum)
Patient seen and examined at the bedside.  The iPad interpretation sign language service was used for this encounter.  She complains of pain in the vagina.  She indicated that she uses long-acting insulin 30 units in the morning and 60 units in the evening (she wrote this down on a piece of paper).  Physical exam is significant for there swelling, tenderness and induration noted on the left vulva.  Continue empiric IV antibiotics and analgesics as needed for pain.  Discontinue IV fluids.  Use insulin glargine and NovoLog for hyperglycemia.  She requested refills on some of her medications and said she had trouble getting into see a doctor because of late appointment times.  Discussed the importance of adequate glucose control to reduce the incidence of infections.  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  She was advised to reduce her sugar intake to help with glucose control.  Skyler, RN, was at the bedside during this encounter.

## 2021-05-06 NOTE — Progress Notes (Signed)
Spoke with pt (using video interpretor # [id number]) about A1C 10.8 (average blood glucose of 263 over the past 2-3 months)  and explained what an A1C is, basic pathophysiology of DM Type 2, basic home care, basic diabetes diet nutrition principles, importance of checking CBGs and maintaining good CBG control to prevent long-term and short-term complications. Reviewed signs and symptoms of hyperglycemia and hypoglycemia and how to treat hypoglycemia at home. Also reviewed blood sugar goals at home.   Patient states she has been taking: Lantus 30 units am + 60 units pm Not taking Humalog now but has taken in the past Metformin 500 mg am + 1 gm pm  Patient has been wearing a libre sensor but ran out of refills # [id number]  Patient requests TOC consult to locate a PCP. States she cannot find a PCP that is accepting new pts. TOC consult ordered.  Thank you, Nani Gasser. Retina Bernardy, RN, MSN, CDE  Diabetes Coordinator Inpatient Glycemic Control Team Team Pager [phone] (8am-5pm) 05/06/2021 12:53 PM

## 2021-05-06 NOTE — Progress Notes (Signed)
Anticoagulation monitoring(Lovenox):  41 yo female ordered Lovenox 40 mg Q24h    Filed Weights   05/06/21 0256  Weight: 92.5 kg (204 lb)   BMI  31.95  Lab Results  Component Value Date   CREATININE 0.74 05/05/2021   CREATININE 0.72 09/02/2020   CREATININE 0.77 09/01/2020   Estimated Creatinine Clearance: 110.3 mL/min (by C-G formula based on SCr of 0.74 mg/dL). Hemoglobin & Hematocrit     Component Value Date/Time   HGB 13.3 05/05/2021 2025   HCT 40.1 05/05/2021 2025     Per Protocol for Patient with estCrcl > 30 ml/min and BMI > 30, will transition to Lovenox 47.5 mg Q24h.

## 2021-05-06 NOTE — ED Notes (Signed)
Patient transported to CT 

## 2021-05-06 NOTE — Sepsis Progress Note (Signed)
Elink following Code Sepsis. 

## 2021-05-06 NOTE — ED Notes (Signed)
ED Provider at bedside. 

## 2021-05-06 NOTE — Progress Notes (Signed)
Inpatient Diabetes Program Recommendations  AACE/ADA: New Consensus Statement on Inpatient Glycemic Control (2015)  Target Ranges:  Prepandial:   less than 140 mg/dL      Peak postprandial:   less than 180 mg/dL (1-2 hours)      Critically ill patients:  140 - 180 mg/dL   Lab Results  Component Value Date   GLUCAP 274 (H) 05/06/2021   HGBA1C 10.8 (H) 05/06/2021    Review of Glycemic Control  Diabetes history: DM2 Outpatient Diabetes medications: Listed as not taking Lantus 10 units am, 60 units pm, Humalog 20 units ac lunch, Metformin bid Current orders for Inpatient glycemic control: Novolog correction 0-20 units tid + hs 0-5 units  Inpatient Diabetes Program Recommendations:   -Semglee 20 units (0.2 units/kg x 92.5 = 18.5 units) -Novolog 3 units tid meal coverage if eats 50% Secure chat sent to Dr. Mal Misty. Plan to meet with patient to discuss A1c of 10.8 and medication management.  Thank you, Nani Gasser. Tekia Waterbury, RN, MSN, CDE  Diabetes Coordinator Inpatient Glycemic Control Team Team Pager [phone] (8am-5pm) 05/06/2021 10:33 AM

## 2021-05-07 DIAGNOSIS — E1165 Type 2 diabetes mellitus with hyperglycemia: Secondary | ICD-10-CM

## 2021-05-07 DIAGNOSIS — A419 Sepsis, unspecified organism: Secondary | ICD-10-CM

## 2021-05-07 DIAGNOSIS — Z794 Long term (current) use of insulin: Secondary | ICD-10-CM

## 2021-05-07 LAB — BASIC METABOLIC PANEL
Anion gap: 6 (ref 5–15)
BUN: 11 mg/dL (ref 6–20)
CO2: 25 mmol/L (ref 22–32)
Calcium: 8 mg/dL — ABNORMAL LOW (ref 8.9–10.3)
Chloride: 101 mmol/L (ref 98–111)
Creatinine, Ser: 0.72 mg/dL (ref 0.44–1.00)
GFR, Estimated: >60 mL/min (ref >60)
Glucose, Bld: 158 mg/dL — ABNORMAL HIGH (ref 70–99)
Potassium: 3.1 mmol/L — ABNORMAL LOW (ref 3.5–5.1)
Sodium: 132 mmol/L — ABNORMAL LOW (ref 135–145)

## 2021-05-07 LAB — CBC WITH DIFFERENTIAL/PLATELET
Abs Immature Granulocytes: 0.07 10*3/uL (ref 0.00–0.07)
Basophils Absolute: 0.1 10*3/uL (ref 0.0–0.1)
Basophils Relative: 0 %
Eosinophils Absolute: 0.2 10*3/uL (ref 0.0–0.5)
Eosinophils Relative: 1 %
HCT: 34.8 % — ABNORMAL LOW (ref 36.0–46.0)
Hemoglobin: 11.5 g/dL — ABNORMAL LOW (ref 12.0–15.0)
Immature Granulocytes: 1 %
Lymphocytes Relative: 13 %
Lymphs Abs: 2 10*3/uL (ref 0.7–4.0)
MCH: 27.4 pg (ref 26.0–34.0)
MCHC: 33 g/dL (ref 30.0–36.0)
MCV: 83.1 fL (ref 80.0–100.0)
Monocytes Absolute: 0.9 10*3/uL (ref 0.1–1.0)
Monocytes Relative: 6 %
Neutro Abs: 12 10*3/uL — ABNORMAL HIGH (ref 1.7–7.7)
Neutrophils Relative %: 79 %
Platelets: 172 10*3/uL (ref 150–400)
RBC: 4.19 MIL/uL (ref 3.87–5.11)
RDW: 14 % (ref 11.5–15.5)
WBC: 15.2 10*3/uL — ABNORMAL HIGH (ref 4.0–10.5)
nRBC: 0 % (ref 0.0–0.2)

## 2021-05-07 LAB — GLUCOSE, CAPILLARY
Glucose-Capillary: 137 mg/dL — ABNORMAL HIGH (ref 70–99)
Glucose-Capillary: 143 mg/dL — ABNORMAL HIGH (ref 70–99)
Glucose-Capillary: 193 mg/dL — ABNORMAL HIGH (ref 70–99)
Glucose-Capillary: 182 mg/dL — ABNORMAL HIGH (ref 70–99)

## 2021-05-07 LAB — MAGNESIUM: Magnesium: 1.7 mg/dL (ref 1.7–2.4)

## 2021-05-07 MED ORDER — POLYETHYLENE GLYCOL 3350 17 G PO PACK
17.0000 g | PACK | Freq: Every day | ORAL | Status: DC
  Administered 2021-05-07 – 2021-05-08 (×2): 17 g via ORAL
  Filled 2021-05-07 (×8): qty 1

## 2021-05-07 MED ORDER — SODIUM CHLORIDE 0.9% FLUSH
10.0000 mL | Freq: Two times a day (BID) | INTRAVENOUS | Status: DC
  Administered 2021-05-07 – 2021-05-14 (×5): 10 mL via INTRAVENOUS

## 2021-05-07 MED ORDER — SODIUM CHLORIDE 0.9 % IV SOLN
12.5000 mg | Freq: Four times a day (QID) | INTRAVENOUS | Status: DC | PRN
  Filled 2021-05-07 (×2): qty 0.5

## 2021-05-07 MED ORDER — SODIUM CHLORIDE 0.9% FLUSH: 10.0000 mL | INTRAVENOUS | Status: DC | PRN

## 2021-05-07 MED ORDER — INFLUENZA VAC SPLIT QUAD 0.5 ML IM SUSY
0.5000 mL | PREFILLED_SYRINGE | INTRAMUSCULAR | Status: DC
  Filled 2021-05-07: qty 0.5

## 2021-05-07 MED ORDER — AMMONIA AROMATIC IN INHA
RESPIRATORY_TRACT | Status: AC
  Administered 2021-05-07: 10
  Filled 2021-05-07: qty 10

## 2021-05-07 MED ORDER — SODIUM CHLORIDE 0.9% FLUSH
10.0000 mL | Freq: Two times a day (BID) | INTRAVENOUS | Status: DC
  Administered 2021-05-07 – 2021-05-09 (×3): 10 mL
  Administered 2021-05-10: 23:00:00 20 mL
  Administered 2021-05-11 – 2021-05-13 (×4): 10 mL

## 2021-05-07 MED ORDER — POTASSIUM CHLORIDE CRYS ER 20 MEQ PO TBCR
40.0000 meq | EXTENDED_RELEASE_TABLET | Freq: Once | ORAL | Status: AC
  Administered 2021-05-07: 40 meq via ORAL
  Filled 2021-05-07: qty 2

## 2021-05-07 MED ORDER — SODIUM CHLORIDE 0.9 % IV SOLN: INTRAVENOUS | Status: DC | PRN

## 2021-05-07 MED ORDER — HYDROMORPHONE HCL 1 MG/ML IJ SOLN
1.0000 mg | INTRAMUSCULAR | Status: DC | PRN
  Administered 2021-05-07 – 2021-05-14 (×16): 1 mg via INTRAVENOUS
  Filled 2021-05-07 (×17): qty 1

## 2021-05-07 NOTE — Progress Notes (Addendum)
A midline has been ordered for this patient. The VAST RN has reviewed the patient's medical record including any arm restrictions, current creatinine clearance, length IV therapy is needed, and infusions needed/ordered to determine if a midline is the appropriate line for this individual patient. If there are contraindications, the physician and primary RN has been contacted by [facility]AST RN for further discussion. Midline Education: a midline is a long peripheral IV placed in the upper arm with the tip located at or near the axilla and distal to the shoulder should not be used as a CLABSI preventative measure   it has one lumen only it can remain in place for up to 29 days  Safe for Vancomycin infusion LESS THAN 6 days Is safe for power injection if good blood return can be obtained and line flushes easily it CANNOT be used for continuous infusion of vesicants. Including TPN and chemotherapy Should NOT be placed for sole intent of obtaining labs as there is no guarantee blood can be successfully drawn from line  contraindicated in patients with thrombosis, hypercoagulability, decreased venous flow to the extremities, ESRD (without a nephrologist's approval), small vessels, allergy to polyurethane, or known/suspected presence of a device-related infection, bacteremia, or septicemia Patient teaching and insertion done with sign language interpreter Helene Kelp [phone] via video call.

## 2021-05-07 NOTE — Progress Notes (Signed)
After IV infusion of Vancomycin at 0330 patient began to complain of extreme irritation at IV site in Left [facility]. Prior to infusion the site flushed well and blood noted when pulled back. After infusion redness was noted by RN at site. Patient requested a new IV site be established and for current IV to be removed promptly. This RN assessed for new site but did not feel confident with any location and requested help from another RN. Two PIV attempts made by Hinton Dyer, RN to right wrist and right hand with no success. Currently no IV access with next dose of abx this afternoon. IV team consult placed. Ice applied to irritated site. Pt supplied with ice packs and heat packs and instructed to alternate heat and ice for comfort. Pt is to call out if she needs help. Will continue to monitor closely.  Erlene Quan 05/07/2021 6:18 AM

## 2021-05-07 NOTE — Progress Notes (Signed)
Patient was gagging but denies nausea. Patient states she does not want anymore medication.

## 2021-05-07 NOTE — TOC Initial Note (Addendum)
Transition of Care [facility]) - Initial/Assessment Note    Patient Details  Name: Jane Schultz MRN: [id number] Date of Birth: December 01, 1980  [facility]) CM/SW Contact:    Anselm Pancoast, RN Phone Number: 05/07/2021, 9:32 AM  Clinical Narrative:                 Call placed to [facility] [employer] to help locate PCP for patient follow up. Confirmed patient is already engaged with PCP at [place] @ [phone].  LVMM for Joen Laura @ [facility] requesting assistance with ensuring PCP follow up and medications/diabetic care after discharge.         Patient Goals and CMS Choice        Expected Discharge Plan and Services                                                Prior Living Arrangements/Services                       Activities of Daily Living      Permission Sought/Granted                  Emotional Assessment              Admission diagnosis:  Cellulitis of labia majora [N76.2] Patient Active Problem List   Diagnosis Date Noted   Cellulitis of labia majora 05/06/2021   Sepsis ([place]) 05/06/2021   Hyperglycemia due to type 2 diabetes mellitus ([place]) 05/06/2021   Type 2 diabetes mellitus without complication ([place]) 97/28/2060   HTN (hypertension) 09/02/2020   Asthma 09/02/2020   Deaf 09/02/2020   Chest pain 09/02/2020   PCP:  Patient, No Pcp Per (Inactive) Pharmacy:   [facility] [place] [address] [place] [address] Phone: [phone] Fax: [place], [place] - [address] [place] [address] Phone: [phone] Fax: [phone]     Social Determinants of Health (SDOH) Interventions    Readmission Risk Interventions No flowsheet data found.

## 2021-05-07 NOTE — Progress Notes (Addendum)
Dilaudid and diluted Toradol given. Patient expressed extreme pain during administration. Attempt was made to administered phenergan Iv but patient could not tolerate. Stopped and IV removed. Md notified. Patient extremely upset and does not want any more Ivs.

## 2021-05-07 NOTE — Progress Notes (Addendum)
Progress Note    Jane Schultz  [id number]:[id number] DOB: 05-12-1980  DOA: 05/05/2021 PCP: Patient, No Pcp Per (Inactive)      Brief Narrative:    Medical records reviewed and are as summarized below:  Jane Schultz is a 41 y.o. female with medical history significant for type 2 diabetes mellitus, hypertension, asthma, deafness, who presented to the hospital because of pain in the left labia.  She was febrile and tachycardic in the emergency room with white cell count of 18,000.  She was admitted to the hospital for sepsis secondary to vulva/mons cellulitis.  She was treated with IV fluids, analgesics and empiric IV antibiotics.    ,      Assessment/Plan:   Principal Problem:   Cellulitis of labia majora Active Problems:   HTN (hypertension)   Deaf   Sepsis ([place])   Hyperglycemia due to type 2 diabetes mellitus ([place])   Body mass index is 31.95 kg/m.  (Morbid obesity)  Sepsis secondary to left-sided vulva and mons cellulitis: Continue empiric IV antibiotics and analgesics.   Nausea and vomiting: Antiemetics as needed  Hypokalemia: Replete potassium and monitor levels  Insulin-dependent type 2 DM with hyperglycemia: Glucose levels are improving.  She said she takes Lantus 30 units in the morning and 60 units in the evening.  However, in the hospital, she is requiring only 30 units of insulin glargine.  She has been counseled on the importance of adequate glucose control, dietary discretion and medical adherence.  Hyponatremia: Monitor BMP  Sign language interpretation service via iPad was used for this encounter.  Dava, RN, was at the bedside during this encounter.   Diet Order             Diet heart healthy/carb modified Room service appropriate? Yes; Fluid consistency: Thin  Diet effective now                      Consultants: Gynecologist  Procedures: None    Medications:    atorvastatin  20 mg Oral Daily    enoxaparin (LOVENOX) injection  0.5 mg/kg Subcutaneous Q24H   [START ON 05/08/2021] influenza vac split quadrivalent PF  0.5 mL Intramuscular Tomorrow-1000   insulin aspart  0-20 Units Subcutaneous TID WC   insulin aspart  0-5 Units Subcutaneous QHS   lisinopril  10 mg Oral Daily   polyethylene glycol  17 g Oral Daily   sodium chloride flush  10 mL Intravenous Q12H   Continuous Infusions:  cefTRIAXone (ROCEPHIN)  IV 2 g (05/07/21 0035)   vancomycin 1,000 mg (05/07/21 0357)     Anti-infectives (From admission, onward)    Start     Dose/Rate Route Frequency Ordered Stop   05/07/21 0100  cefTRIAXone (ROCEPHIN) 2 g in sodium chloride 0.9 % 100 mL IVPB        2 g 200 mL/hr over 30 Minutes Intravenous Every 24 hours 05/06/21 0245 05/13/21 0059   05/06/21 1500  vancomycin (VANCOCIN) IVPB 1000 mg/200 mL premix        1,000 mg 200 mL/hr over 60 Minutes Intravenous Every 12 hours 05/06/21 0344     05/06/21 0345  vancomycin (VANCOREADY) IVPB 1250 mg/250 mL        1,250 mg 166.7 mL/hr over 90 Minutes Intravenous  Once 05/06/21 0339 05/06/21 0659   05/06/21 0230  vancomycin (VANCOCIN) IVPB 1000 mg/200 mL premix        1,000 mg 200 mL/hr over 60 Minutes Intravenous  Once  05/06/21 0216 05/06/21 0332   05/06/21 0030  cefTRIAXone (ROCEPHIN) 2 g in sodium chloride 0.9 % 100 mL IVPB        2 g 200 mL/hr over 30 Minutes Intravenous  Once 05/06/21 0023 05/06/21 0122              Family Communication/Anticipated D/C date and plan/Code Status   DVT prophylaxis:      Code Status: Full Code  Family Communication: None Disposition Plan: Plan to discharge home in 1 to 2 days   Status is: Inpatient  Remains inpatient appropriate because: IV antibiotics           Subjective:   Interval events noted.  She complains of nausea, vomiting and pain in the vagina  Objective:    Vitals:   05/07/21 0345 05/07/21 0843 05/07/21 1123 05/07/21 1156  BP: 137/67 135/82  (!) 113/59   Pulse: 91 88  73  Resp: 18 20  18   Temp: 98.7 F (37.1 C) 98 F (36.7 C) 99 F (37.2 C) 98 F (36.7 C)  TempSrc: Oral  Oral Oral  SpO2: 99% 99%  98%  Weight:      Height:       No data found.   Intake/Output Summary (Last 24 hours) at 05/07/2021 1239 Last data filed at 05/07/2021 0539 Gross per 24 hour  Intake --  Output 300 ml  Net -300 ml   Filed Weights   05/06/21 0256  Weight: 92.5 kg    Exam:  GEN: NAD SKIN: Warm and dry EYES: No pallor or icterus ENT: MMM CV: RRR PULM: CTA B ABD: soft, obese, NT, +BS CNS: AAO x 3, non focal EXT: No edema or tenderness GU: Swelling, induration and tenderness on the left side of the mons pubis and the left vulva   Dava, RN, at bedside as chaperone   Data Reviewed:   I have personally reviewed following labs and imaging studies:  Labs: Labs show the following:   Basic Metabolic Panel: Recent Labs  Lab 05/05/21 2025 05/06/21 0629 05/07/21 1146  NA 131* 131* 132*  K 3.3* 3.5 3.1*  CL 101 103 101  CO2 21* 21* 25  GLUCOSE 326* 323* 158*  BUN 10 10 11   CREATININE 0.74 0.71 0.72  CALCIUM 8.7* 8.0* 8.0*   GFR Estimated Creatinine Clearance: 110.3 mL/min (by C-G formula based on SCr of 0.72 mg/dL). Liver Function Tests: Recent Labs  Lab 05/05/21 2025  AST 36  ALT 30  ALKPHOS 79  BILITOT 0.8  PROT 7.3  ALBUMIN 3.5   No results for input(s): LIPASE, AMYLASE in the last 168 hours. No results for input(s): AMMONIA in the last 168 hours. Coagulation profile Recent Labs  Lab 05/06/21 0629  INR 1.2    CBC: Recent Labs  Lab 05/05/21 2025 05/06/21 0629 05/07/21 1146  WBC 18.1* 16.9* 15.2*  NEUTROABS 15.6*  --  12.0*  HGB 13.3 12.3 11.5*  HCT 40.1 36.2 34.8*  MCV 82.0 82.3 83.1  PLT 219 179 172   Cardiac Enzymes: No results for input(s): CKTOTAL, CKMB, CKMBINDEX, TROPONINI in the last 168 hours. BNP (last 3 results) No results for input(s): PROBNP in the last 8760 hours. CBG: Recent Labs  Lab  05/06/21 1237 05/06/21 1722 05/06/21 2143 05/07/21 0841 05/07/21 1125  GLUCAP 171* 223* 167* 137* 143*   D-Dimer: No results for input(s): DDIMER in the last 72 hours. Hgb A1c: Recent Labs    05/06/21 0629  HGBA1C 10.8*  Lipid Profile: No results for input(s): CHOL, HDL, LDLCALC, TRIG, CHOLHDL, LDLDIRECT in the last 72 hours. Thyroid function studies: No results for input(s): TSH, T4TOTAL, T3FREE, THYROIDAB in the last 72 hours.  Invalid input(s): FREET3 Anemia work up: No results for input(s): VITAMINB12, FOLATE, FERRITIN, TIBC, IRON, RETICCTPCT in the last 72 hours. Sepsis Labs: Recent Labs  Lab 05/05/21 2025 05/06/21 0031 05/06/21 0629 05/07/21 1146  PROCALCITON  --   --  0.24  --   WBC 18.1*  --  16.9* 15.2*  LATICACIDVEN  --  1.0 1.1  --     Microbiology Recent Results (from the past 240 hour(s))  Blood culture (routine x 2)     Status: None (Preliminary result)   Collection Time: 05/06/21 12:31 AM   Specimen: BLOOD  Result Value Ref Range Status   Specimen Description BLOOD LEFT ASSIST CONTROL  Final   Special Requests   Final    BOTTLES DRAWN AEROBIC AND ANAEROBIC Blood Culture adequate volume   Culture   Final    NO GROWTH 1 DAY Performed at [facility], [address], [place], [place] [address]    Report Status PENDING  Incomplete  Resp Panel by RT-PCR (Flu A&B, Covid) Nasopharyngeal Swab     Status: None   Collection Time: 05/06/21 12:31 AM   Specimen: Nasopharyngeal Swab; Nasopharyngeal(NP) swabs in vial transport medium  Result Value Ref Range Status   SARS Coronavirus 2 by RT PCR NEGATIVE NEGATIVE Final    Comment: (NOTE) SARS-CoV-2 target nucleic acids are NOT DETECTED.  The SARS-CoV-2 RNA is generally detectable in upper respiratory specimens during the acute phase of infection. The lowest concentration of SARS-CoV-2 viral copies this assay can detect is 138 copies/mL. A negative result does not preclude SARS-Cov-2 infection  and should not be used as the sole basis for treatment or other patient management decisions. A negative result may occur with  improper specimen collection/handling, submission of specimen other than nasopharyngeal swab, presence of viral mutation(s) within the areas targeted by this assay, and inadequate number of viral copies(<138 copies/mL). A negative result must be combined with clinical observations, patient history, and epidemiological information. The expected result is Negative.  Fact Sheet for Patients:  EntrepreneurPulse.com.au  Fact Sheet for Healthcare Providers:  IncredibleEmployment.be  This test is no t yet approved or cleared by the [place] FDA and  has been authorized for detection and/or diagnosis of SARS-CoV-2 by FDA under an Emergency Use Authorization (EUA). This EUA will remain  in effect (meaning this test can be used) for the duration of the COVID-19 declaration under Section 564(b)(1) of the Act, 21 U.S.C.section 360bbb-3(b)(1), unless the authorization is terminated  or revoked sooner.       Influenza A by PCR NEGATIVE NEGATIVE Final   Influenza B by PCR NEGATIVE NEGATIVE Final    Comment: (NOTE) The Xpert Xpress SARS-CoV-2/FLU/RSV plus assay is intended as an aid in the diagnosis of influenza from Nasopharyngeal swab specimens and should not be used as a sole basis for treatment. Nasal washings and aspirates are unacceptable for Xpert Xpress SARS-CoV-2/FLU/RSV testing.  Fact Sheet for Patients: EntrepreneurPulse.com.au  Fact Sheet for Healthcare Providers: IncredibleEmployment.be  This test is not yet approved or cleared by the [place] FDA and has been authorized for detection and/or diagnosis of SARS-CoV-2 by FDA under an Emergency Use Authorization (EUA). This EUA will remain in effect (meaning this test can be used) for the duration of the COVID-19 declaration  under Section 564(b)(1)  of the Act, 21 U.S.C. section 360bbb-3(b)(1), unless the authorization is terminated or revoked.  Performed at [facility], [address] [place], [place] [address]   Culture, blood (Routine X 2) w Reflex to ID Panel     Status: None (Preliminary result)   Collection Time: 05/06/21  6:29 AM   Specimen: BLOOD  Result Value Ref Range Status   Specimen Description BLOOD RIGHT ANTECUBITAL  Final   Special Requests   Final    BOTTLES DRAWN AEROBIC AND ANAEROBIC Blood Culture adequate volume   Culture   Final    NO GROWTH < 24 HOURS Performed at [facility], [address] [place],  [address]    Report Status PENDING  Incomplete    Procedures and diagnostic studies:  CT ABDOMEN PELVIS W CONTRAST  Result Date: 05/06/2021 CLINICAL DATA:  Evaluate for possible abscess to the right buttocks and groin. EXAM: CT ABDOMEN AND PELVIS WITH CONTRAST TECHNIQUE: Multidetector CT imaging of the abdomen and pelvis was performed using the standard protocol following bolus administration of intravenous contrast. RADIATION DOSE REDUCTION: This exam was performed according to the departmental dose-optimization program which includes automated exposure control, adjustment of the mA and/or kV according to patient size and/or use of iterative reconstruction technique. CONTRAST:  124mL OMNIPAQUE IOHEXOL 300 MG/ML  SOLN COMPARISON:  Sep 10, 2018 FINDINGS: Lower chest: No acute abnormality. Hepatobiliary: No focal liver abnormality is seen. Status post cholecystectomy. No biliary dilatation. Pancreas: Unremarkable. No pancreatic ductal dilatation or surrounding inflammatory changes. Spleen: Normal in size without focal abnormality. Adrenals/Urinary Tract: A stable 3.6 cm x 1.1 cm homogeneous low-attenuation (approximately 47.86 Hounsfield units) right adrenal mass is seen. Mild, stable diffuse enlargement of the left adrenal gland is seen. Kidneys are normal, without  renal calculi, focal lesion, or hydronephrosis. Bladder is unremarkable. Stomach/Bowel: Stomach is within normal limits. Appendix appears normal. No evidence of bowel wall thickening, distention, or inflammatory changes. Vascular/Lymphatic: No significant vascular findings are present. There is mild bilateral inguinal lymphadenopathy. Reproductive: Uterus and bilateral adnexa are unremarkable. Other: Moderate severity subcutaneous inflammatory fat stranding is seen along the vulva on the left. This extends superiorly to the inferior aspect of the left inguinal region. A 2.2 cm x 1.2 cm area of homogeneous low attenuation (approximately 55.50 Hounsfield units) is noted slightly to the right of midline along the posterior aspect of the vulva on the right. No associated inflammatory fat stranding is clearly identified within the vulva on the right. No abdominopelvic ascites. Musculoskeletal: No acute or significant osseous findings. IMPRESSION: 1. Moderate to marked severity cellulitis along the vulva on the left, extending superiorly to the inferior aspect of the left inguinal region. 2. 2.2 cm x 1.2 cm area of homogeneous low attenuation along the posterior aspect of the vulva on the right. This may represent a small abscess. 3. 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 4. Stable diffuse enlargement of the left adrenal gland. 5. Evidence of prior cholecystectomy. Electronically Signed   By: Virgina Norfolk M.D.   On: 05/06/2021 01:52               LOS: 1 day   Lema Heinkel  [facility]   Pager on [email or web address]. If 7PM-7AM, please contact night-coverage at [email or web address]     05/07/2021, 12:39 PM

## 2021-05-07 NOTE — Progress Notes (Signed)
Inpatient Diabetes Program Recommendations  AACE/ADA: New Consensus Statement on Inpatient Glycemic Control (2015)  Target Ranges:  Prepandial:   less than 140 mg/dL      Peak postprandial:   less than 180 mg/dL (1-2 hours)      Critically ill patients:  140 - 180 mg/dL    Latest Reference Range & Units 05/06/21 08:47 05/06/21 12:37 05/06/21 17:22 05/06/21 21:43  Glucose-Capillary 70 - 99 mg/dL 274 (H)  11 units Novolog  171 (H)  4 units Novolog  223 (H)  7 units Novolog  30 units Semglee 167 (H)    Latest Reference Range & Units 05/07/21 08:41  Glucose-Capillary 70 - 99 mg/dL 137 (H)      Patient states she has been taking: Lantus 30 units am + 60 units pm Not taking Humalog now but has taken in the past Metformin 500 mg am + 1 gm pm Patient has been wearing a libre sensor but ran out of refills  Current Orders: Novolog Resistant Correction Scale/ SSI (0-20 units) TID AC + HS     MD- Note patient given one-time dose Semglee insulin 30 units yesterday around 6pm  No Semglee insulin ordered for today--AM CBG improved to 137 this AM  Pt requesting Samples of Freestyle Libre 2 CGM (she ran out and has not been able to get refills)--Note Diabetes RN gave pt 2 sample sensors  Please consider:  Start Semglee 30 units QHS (one time dose given last PM)  Please also provide order for Freestyle Libre CGM samples--Order [phone] (sample given to pt yesterday)--just needs order for d/c    --Will follow patient during hospitalization--  Wyn Quaker RN, MSN, CDE Diabetes Coordinator Inpatient Glycemic Control Team Team Pager: [phone] (8a-5p)

## 2021-05-07 NOTE — Progress Notes (Addendum)
Medication held because patient was nauseas and Phenergan was not available.

## 2021-05-07 NOTE — Progress Notes (Signed)
Reported vomiting to MD. New orders given.

## 2021-05-07 NOTE — Progress Notes (Signed)
Patient in shower. She answers that her pain is still 7. Rn not sure patient understands pain rating. Unable to give any more medication at this time.

## 2021-05-08 ENCOUNTER — Inpatient Hospital Stay: Payer: Medicaid Other

## 2021-05-08 LAB — GLUCOSE, CAPILLARY
Glucose-Capillary: 169 mg/dL — ABNORMAL HIGH (ref 70–99)
Glucose-Capillary: 209 mg/dL — ABNORMAL HIGH (ref 70–99)
Glucose-Capillary: 175 mg/dL — ABNORMAL HIGH (ref 70–99)
Glucose-Capillary: 186 mg/dL — ABNORMAL HIGH (ref 70–99)

## 2021-05-08 LAB — BASIC METABOLIC PANEL
Anion gap: 7 (ref 5–15)
BUN: 14 mg/dL (ref 6–20)
CO2: 27 mmol/L (ref 22–32)
Calcium: 8.2 mg/dL — ABNORMAL LOW (ref 8.9–10.3)
Chloride: 99 mmol/L (ref 98–111)
Creatinine, Ser: 0.81 mg/dL (ref 0.44–1.00)
GFR, Estimated: >60 mL/min (ref >60)
Glucose, Bld: 230 mg/dL — ABNORMAL HIGH (ref 70–99)
Potassium: 3.8 mmol/L (ref 3.5–5.1)
Sodium: 133 mmol/L — ABNORMAL LOW (ref 135–145)

## 2021-05-08 LAB — CBC WITH DIFFERENTIAL/PLATELET
Abs Immature Granulocytes: 0.1 10*3/uL — ABNORMAL HIGH (ref 0.00–0.07)
Basophils Absolute: 0.1 10*3/uL (ref 0.0–0.1)
Basophils Relative: 0 %
Eosinophils Absolute: 0.2 10*3/uL (ref 0.0–0.5)
Eosinophils Relative: 1 %
HCT: 35.9 % — ABNORMAL LOW (ref 36.0–46.0)
Hemoglobin: 11.8 g/dL — ABNORMAL LOW (ref 12.0–15.0)
Immature Granulocytes: 1 %
Lymphocytes Relative: 7 %
Lymphs Abs: 0.9 10*3/uL (ref 0.7–4.0)
MCH: 27.3 pg (ref 26.0–34.0)
MCHC: 32.9 g/dL (ref 30.0–36.0)
MCV: 83.1 fL (ref 80.0–100.0)
Monocytes Absolute: 0.8 10*3/uL (ref 0.1–1.0)
Monocytes Relative: 7 %
Neutro Abs: 10.5 10*3/uL — ABNORMAL HIGH (ref 1.7–7.7)
Neutrophils Relative %: 84 %
Platelets: 184 10*3/uL (ref 150–400)
RBC: 4.32 MIL/uL (ref 3.87–5.11)
RDW: 13.6 % (ref 11.5–15.5)
WBC: 12.6 10*3/uL — ABNORMAL HIGH (ref 4.0–10.5)
nRBC: 0 % (ref 0.0–0.2)

## 2021-05-08 LAB — PHOSPHORUS: Phosphorus: 2.9 mg/dL (ref 2.5–4.6)

## 2021-05-08 LAB — MAGNESIUM: Magnesium: 1.9 mg/dL (ref 1.7–2.4)

## 2021-05-08 MED ORDER — BISACODYL 5 MG PO TBEC
10.0000 mg | DELAYED_RELEASE_TABLET | Freq: Once | ORAL | Status: DC
  Filled 2021-05-08: qty 2

## 2021-05-08 NOTE — Progress Notes (Signed)
Progress Note    Jane Schultz  GTX:[id number] DOB: 01-Apr-1981  DOA: 05/05/2021 PCP: Patient, No Pcp Per (Inactive)      Brief Narrative:    Medical records reviewed and are as summarized below:  Jane Schultz is a 41 y.o. female with medical history significant for type 2 diabetes mellitus, hypertension, asthma, deafness, who presented to the hospital because of pain in the left labia.  She was febrile and tachycardic in the emergency room with white cell count of 18,000.  She was admitted to the hospital for sepsis secondary to vulva/mons cellulitis.  She was treated with IV fluids, analgesics and empiric IV antibiotics.    ,      Assessment/Plan:   Principal Problem:   Cellulitis of labia majora Active Problems:   HTN (hypertension)   Deaf   Sepsis ([place])   Hyperglycemia due to type 2 diabetes mellitus ([place])   Body mass index is 31.95 kg/m.  (Morbid obesity)  Sepsis secondary to left-sided vulva and mons cellulitis, fever: Leukocytosis is improving.  Obtain ultrasound of the mons pubis area to exclude an abscess.  Continue empiric IV antibiotics, analgesics and antiemetics.    Nausea and vomiting: Antiemetics as needed.  Hypokalemia, hyponatremia: Repeat BMP because of recent vomiting.  Insulin-dependent type 2 DM with hyperglycemia: Glucose levels are improving.  She said she takes Lantus 30 units in the morning and 60 units in the evening.  However, in the hospital, she is requiring only 30 units of insulin glargine.  She has been counseled on the importance of adequate glucose control, dietary discretion and medical adherence.  Sign language interpretation service via iPad was used for this encounter  Maddy, RN and a student nurse were present during this encounter.     Diet Order             Diet heart healthy/carb modified Room service appropriate? Yes; Fluid consistency: Thin  Diet effective now                       Consultants: Gynecologist  Procedures: None    Medications:    atorvastatin  20 mg Oral Daily   enoxaparin (LOVENOX) injection  0.5 mg/kg Subcutaneous Q24H   influenza vac split quadrivalent PF  0.5 mL Intramuscular Tomorrow-1000   insulin aspart  0-20 Units Subcutaneous TID WC   insulin aspart  0-5 Units Subcutaneous QHS   lisinopril  10 mg Oral Daily   polyethylene glycol  17 g Oral Daily   sodium chloride flush  10 mL Intravenous Q12H   sodium chloride flush  10-40 mL Intracatheter Q12H   Continuous Infusions:  sodium chloride 10 mL/hr at 05/07/21 2111   cefTRIAXone (ROCEPHIN)  IV 2 g (05/08/21 0050)   promethazine (PHENERGAN) injection (IM or IVPB)     vancomycin 1,000 mg (05/08/21 0256)     Anti-infectives (From admission, onward)    Start     Dose/Rate Route Frequency Ordered Stop   05/07/21 0100  cefTRIAXone (ROCEPHIN) 2 g in sodium chloride 0.9 % 100 mL IVPB        2 g 200 mL/hr over 30 Minutes Intravenous Every 24 hours 05/06/21 0245 05/13/21 0059   05/06/21 1500  vancomycin (VANCOCIN) IVPB 1000 mg/200 mL premix        1,000 mg 200 mL/hr over 60 Minutes Intravenous Every 12 hours 05/06/21 0344     05/06/21 0345  vancomycin (VANCOREADY) IVPB 1250 mg/250 mL  1,250 mg 166.7 mL/hr over 90 Minutes Intravenous  Once 05/06/21 0339 05/06/21 0659   05/06/21 0230  vancomycin (VANCOCIN) IVPB 1000 mg/200 mL premix        1,000 mg 200 mL/hr over 60 Minutes Intravenous  Once 05/06/21 0216 05/06/21 0332   05/06/21 0030  cefTRIAXone (ROCEPHIN) 2 g in sodium chloride 0.9 % 100 mL IVPB        2 g 200 mL/hr over 30 Minutes Intravenous  Once 05/06/21 0023 05/06/21 0122              Family Communication/Anticipated D/C date and plan/Code Status   DVT prophylaxis:      Code Status: Full Code  Family Communication: None Disposition Plan: Plan to discharge home in 1 to 2 days   Status is: Inpatient  Remains inpatient appropriate because: IV  antibiotics           Subjective:   Interval events noted.  She had fever with T-max of 100.8 F this morning.  She complains of pubic and vulvar pain, constipation, nausea and vomiting. Maddy, RN was at the bedside  Objective:    Vitals:   05/08/21 0306 05/08/21 0804 05/08/21 1016 05/08/21 1141  BP: 129/64 (!) 163/90 (!) 158/78 (!) 126/53  Pulse: 81 96  94  Resp: 18 20  18   Temp: 98.7 F (37.1 C) (!) 100.8 F (38.2 C)  99.9 F (37.7 C)  TempSrc:  Oral  Oral  SpO2: 100% 100%  100%  Weight:      Height:       No data found.   Intake/Output Summary (Last 24 hours) at 05/08/2021 1151 Last data filed at 05/08/2021 0800 Gross per 24 hour  Intake 840 ml  Output 850 ml  Net -10 ml   Filed Weights   05/06/21 0256  Weight: 92.5 kg    Exam:  GEN: NAD SKIN: Warm and dry EYES: No pallor or icterus ENT: MMM CV: RRR PULM: CTA B ABD: soft, obese, NT, +BS CNS: AAO x 3, non focal EXT: No edema or tenderness GU: Induration on the left part of the mons pubis appears to have gotten bigger.  Mild induration on the left vulva.  Swelling and tenderness on the left part of the mons pubis and the left forearm.  Maddy, RN, and student nurse were at the bedside.   Data Reviewed:   I have personally reviewed following labs and imaging studies:  Labs: Labs show the following:   Basic Metabolic Panel: Recent Labs  Lab 05/05/21 2025 05/06/21 0629 05/07/21 1146  NA 131* 131* 132*  K 3.3* 3.5 3.1*  CL 101 103 101  CO2 21* 21* 25  GLUCOSE 326* 323* 158*  BUN 10 10 11   CREATININE 0.74 0.71 0.72  CALCIUM 8.7* 8.0* 8.0*  MG  --   --  1.7   GFR Estimated Creatinine Clearance: 110.3 mL/min (by C-G formula based on SCr of 0.72 mg/dL). Liver Function Tests: Recent Labs  Lab 05/05/21 2025  AST 36  ALT 30  ALKPHOS 79  BILITOT 0.8  PROT 7.3  ALBUMIN 3.5   No results for input(s): LIPASE, AMYLASE in the last 168 hours. No results for input(s): AMMONIA in the last  168 hours. Coagulation profile Recent Labs  Lab 05/06/21 0629  INR 1.2    CBC: Recent Labs  Lab 05/05/21 2025 05/06/21 0629 05/07/21 1146 05/08/21 0648  WBC 18.1* 16.9* 15.2* 12.6*  NEUTROABS 15.6*  --  12.0* 10.5*  HGB  13.3 12.3 11.5* 11.8*  HCT 40.1 36.2 34.8* 35.9*  MCV 82.0 82.3 83.1 83.1  PLT 219 179 172 184   Cardiac Enzymes: No results for input(s): CKTOTAL, CKMB, CKMBINDEX, TROPONINI in the last 168 hours. BNP (last 3 results) No results for input(s): PROBNP in the last 8760 hours. CBG: Recent Labs  Lab 05/07/21 0841 05/07/21 1125 05/07/21 1635 05/07/21 2210 05/08/21 0937  GLUCAP 137* 143* 193* 182* 169*   D-Dimer: No results for input(s): DDIMER in the last 72 hours. Hgb A1c: Recent Labs    05/06/21 0629  HGBA1C 10.8*   Lipid Profile: No results for input(s): CHOL, HDL, LDLCALC, TRIG, CHOLHDL, LDLDIRECT in the last 72 hours. Thyroid function studies: No results for input(s): TSH, T4TOTAL, T3FREE, THYROIDAB in the last 72 hours.  Invalid input(s): FREET3 Anemia work up: No results for input(s): VITAMINB12, FOLATE, FERRITIN, TIBC, IRON, RETICCTPCT in the last 72 hours. Sepsis Labs: Recent Labs  Lab 05/05/21 2025 05/06/21 0031 05/06/21 0629 05/07/21 1146 05/08/21 0648  PROCALCITON  --   --  0.24  --   --   WBC 18.1*  --  16.9* 15.2* 12.6*  LATICACIDVEN  --  1.0 1.1  --   --     Microbiology Recent Results (from the past 240 hour(s))  Blood culture (routine x 2)     Status: None (Preliminary result)   Collection Time: 05/06/21 12:31 AM   Specimen: BLOOD  Result Value Ref Range Status   Specimen Description BLOOD LEFT ASSIST CONTROL  Final   Special Requests   Final    BOTTLES DRAWN AEROBIC AND ANAEROBIC Blood Culture adequate volume   Culture   Final    NO GROWTH 2 DAYS Performed at [facility], [address], [place], [place] [address]    Report Status PENDING  Incomplete  Resp Panel by RT-PCR (Flu A&B, Covid)  Nasopharyngeal Swab     Status: None   Collection Time: 05/06/21 12:31 AM   Specimen: Nasopharyngeal Swab; Nasopharyngeal(NP) swabs in vial transport medium  Result Value Ref Range Status   SARS Coronavirus 2 by RT PCR NEGATIVE NEGATIVE Final    Comment: (NOTE) SARS-CoV-2 target nucleic acids are NOT DETECTED.  The SARS-CoV-2 RNA is generally detectable in upper respiratory specimens during the acute phase of infection. The lowest concentration of SARS-CoV-2 viral copies this assay can detect is 138 copies/mL. A negative result does not preclude SARS-Cov-2 infection and should not be used as the sole basis for treatment or other patient management decisions. A negative result may occur with  improper specimen collection/handling, submission of specimen other than nasopharyngeal swab, presence of viral mutation(s) within the areas targeted by this assay, and inadequate number of viral copies(<138 copies/mL). A negative result must be combined with clinical observations, patient history, and epidemiological information. The expected result is Negative.  Fact Sheet for Patients:  EntrepreneurPulse.com.au  Fact Sheet for Healthcare Providers:  IncredibleEmployment.be  This test is no t yet approved or cleared by the [place] FDA and  has been authorized for detection and/or diagnosis of SARS-CoV-2 by FDA under an Emergency Use Authorization (EUA). This EUA will remain  in effect (meaning this test can be used) for the duration of the COVID-19 declaration under Section 564(b)(1) of the Act, 21 U.S.C.section 360bbb-3(b)(1), unless the authorization is terminated  or revoked sooner.       Influenza A by PCR NEGATIVE NEGATIVE Final   Influenza B by PCR NEGATIVE NEGATIVE Final    Comment: (  NOTE) The Xpert Xpress SARS-CoV-2/FLU/RSV plus assay is intended as an aid in the diagnosis of influenza from Nasopharyngeal swab specimens and should not be  used as a sole basis for treatment. Nasal washings and aspirates are unacceptable for Xpert Xpress SARS-CoV-2/FLU/RSV testing.  Fact Sheet for Patients: EntrepreneurPulse.com.au  Fact Sheet for Healthcare Providers: IncredibleEmployment.be  This test is not yet approved or cleared by the [place] FDA and has been authorized for detection and/or diagnosis of SARS-CoV-2 by FDA under an Emergency Use Authorization (EUA). This EUA will remain in effect (meaning this test can be used) for the duration of the COVID-19 declaration under Section 564(b)(1) of the Act, 21 U.S.C. section 360bbb-3(b)(1), unless the authorization is terminated or revoked.  Performed at [facility], [address] [place], [place] [address]   Culture, blood (Routine X 2) w Reflex to ID Panel     Status: None (Preliminary result)   Collection Time: 05/06/21  6:29 AM   Specimen: BLOOD  Result Value Ref Range Status   Specimen Description BLOOD RIGHT ANTECUBITAL  Final   Special Requests   Final    BOTTLES DRAWN AEROBIC AND ANAEROBIC Blood Culture adequate volume   Culture   Final    NO GROWTH 2 DAYS Performed at [facility], [address], [place], [place] [address]    Report Status PENDING  Incomplete    Procedures and diagnostic studies:  No results found.             LOS: 2 days   Tobyn Osgood  [facility]   Pager on [email or web address]. If 7PM-7AM, please contact night-coverage at [email or web address]     05/08/2021, 11:51 AM

## 2021-05-08 NOTE — Progress Notes (Signed)
[employer]ia [employer] Interpreter, [place], Hand-Off and Bed Side Report was given with Patient

## 2021-05-09 DIAGNOSIS — L89319 Pressure ulcer of right buttock, unspecified stage: Secondary | ICD-10-CM

## 2021-05-09 DIAGNOSIS — N764 Abscess of vulva: Secondary | ICD-10-CM

## 2021-05-09 LAB — CBC WITH DIFFERENTIAL/PLATELET
Abs Immature Granulocytes: 0.05 10*3/uL (ref 0.00–0.07)
Basophils Absolute: 0.1 10*3/uL (ref 0.0–0.1)
Basophils Relative: 1 %
Eosinophils Absolute: 0.2 10*3/uL (ref 0.0–0.5)
Eosinophils Relative: 2 %
HCT: 33.4 % — ABNORMAL LOW (ref 36.0–46.0)
Hemoglobin: 11.2 g/dL — ABNORMAL LOW (ref 12.0–15.0)
Immature Granulocytes: 0 %
Lymphocytes Relative: 8 %
Lymphs Abs: 1.1 10*3/uL (ref 0.7–4.0)
MCH: 27.2 pg (ref 26.0–34.0)
MCHC: 33.5 g/dL (ref 30.0–36.0)
MCV: 81.1 fL (ref 80.0–100.0)
Monocytes Absolute: 0.9 10*3/uL (ref 0.1–1.0)
Monocytes Relative: 7 %
Neutro Abs: 10.6 10*3/uL — ABNORMAL HIGH (ref 1.7–7.7)
Neutrophils Relative %: 82 %
Platelets: 212 10*3/uL (ref 150–400)
RBC: 4.12 MIL/uL (ref 3.87–5.11)
RDW: 13.6 % (ref 11.5–15.5)
WBC: 12.9 10*3/uL — ABNORMAL HIGH (ref 4.0–10.5)
nRBC: 0 % (ref 0.0–0.2)

## 2021-05-09 LAB — BASIC METABOLIC PANEL
Anion gap: 9 (ref 5–15)
BUN: 14 mg/dL (ref 6–20)
CO2: 24 mmol/L (ref 22–32)
Calcium: 8.2 mg/dL — ABNORMAL LOW (ref 8.9–10.3)
Chloride: 102 mmol/L (ref 98–111)
Creatinine, Ser: 0.77 mg/dL (ref 0.44–1.00)
GFR, Estimated: >60 mL/min (ref >60)
Glucose, Bld: 183 mg/dL — ABNORMAL HIGH (ref 70–99)
Potassium: 3.4 mmol/L — ABNORMAL LOW (ref 3.5–5.1)
Sodium: 135 mmol/L (ref 135–145)

## 2021-05-09 LAB — GLUCOSE, CAPILLARY
Glucose-Capillary: 163 mg/dL — ABNORMAL HIGH (ref 70–99)
Glucose-Capillary: 220 mg/dL — ABNORMAL HIGH (ref 70–99)
Glucose-Capillary: 198 mg/dL — ABNORMAL HIGH (ref 70–99)
Glucose-Capillary: 154 mg/dL — ABNORMAL HIGH (ref 70–99)

## 2021-05-09 LAB — MAGNESIUM: Magnesium: 1.9 mg/dL (ref 1.7–2.4)

## 2021-05-09 LAB — VANCOMYCIN, PEAK: Vancomycin Pk: 19 ug/mL — ABNORMAL LOW (ref 30–40)

## 2021-05-09 LAB — PHOSPHORUS: Phosphorus: 2.8 mg/dL (ref 2.5–4.6)

## 2021-05-09 MED ORDER — METOPROLOL TARTRATE 25 MG PO TABS
12.5000 mg | ORAL_TABLET | Freq: Two times a day (BID) | ORAL | Status: DC
  Administered 2021-05-09 – 2021-05-13 (×8): 12.5 mg via ORAL
  Filled 2021-05-09 (×12): qty 0.5

## 2021-05-09 MED ORDER — METOPROLOL TARTRATE 12.5 MG HALF TABLET
12.5000 mg | ORAL_TABLET | Freq: Two times a day (BID) | ORAL | Status: DC
  Filled 2021-05-09: qty 1

## 2021-05-09 MED ORDER — LIDOCAINE HCL (PF) 1 % IJ SOLN
INTRAMUSCULAR | Status: AC
  Filled 2021-05-09: qty 30

## 2021-05-09 MED ORDER — METOPROLOL TARTRATE 50 MG PO TABS: 25.0000 mg | ORAL_TABLET | Freq: Two times a day (BID) | ORAL | Status: DC

## 2021-05-09 MED ORDER — ZINC OXIDE 12.8 % EX OINT
TOPICAL_OINTMENT | Freq: Two times a day (BID) | CUTANEOUS | Status: DC
  Filled 2021-05-09: qty 56.7

## 2021-05-09 MED ORDER — POTASSIUM CHLORIDE CRYS ER 20 MEQ PO TBCR
40.0000 meq | EXTENDED_RELEASE_TABLET | Freq: Once | ORAL | Status: AC
  Administered 2021-05-09: 40 meq via ORAL
  Filled 2021-05-09: qty 2

## 2021-05-09 MED ORDER — METOPROLOL TARTRATE 12.5 MG HALF TABLET: 12.5000 mg | ORAL_TABLET | Freq: Two times a day (BID) | ORAL | Status: DC

## 2021-05-09 NOTE — TOC Initial Note (Signed)
Transition of Care [facility]) - Initial/Assessment Note    Patient Details  Name: Jane Schultz MRN: [id number] Date of Birth: 1980-09-06  [facility] CM/SW Contact:    [name], [name] Phone Number: 05/09/2021, 11:11 AM  Clinical Narrative:                 CSW consult for PCP needed. CSW told patient to call the number on the back of Medicaid card to find doctor that takes medicaid. Patient expressed frustration with multiple failed attempts to call PCP that are not currently taking new patients.         Patient Goals and CMS Choice        Expected Discharge Plan and Services                                                Prior Living Arrangements/Services                       Activities of Daily Living      Permission Sought/Granted                  Emotional Assessment              Admission diagnosis:  Cellulitis of labia majora [N76.2] Patient Active Problem List   Diagnosis Date Noted   Cellulitis of labia majora 05/06/2021   Sepsis ([place]) 05/06/2021   Hyperglycemia due to type 2 diabetes mellitus ([place]) 05/06/2021   Type 2 diabetes mellitus without complication ([place]) 68/02/5725   HTN (hypertension) 09/02/2020   Asthma 09/02/2020   Deaf 09/02/2020   Chest pain 09/02/2020   PCP:  Patient, No Pcp Per (Inactive) Pharmacy:   [facility] [place], [place] - [address] [place] [address] Phone: [phone] Fax: [place], [place] - [address] [place] [address] Phone: [phone] Fax: [phone]     Social Determinants of Health (SDOH) Interventions    Readmission Risk Interventions No flowsheet data found.

## 2021-05-09 NOTE — Progress Notes (Signed)
Pts BP continued to increase this afternoon. Complaining of headache 10/10 with N/V. Patient stated she does take BP meds at home but has not been able to find a new PCP that is accepting new patients and has not been able to get a refill.  MD notified. New orders placed for metoprolol.

## 2021-05-09 NOTE — Progress Notes (Signed)
[place] [name] NP notified of Pt. Temperature of 102.4. No new orders received. Provider stated the Tylenol that was given to treat fever was appropriate at this time and that she will review Patients chart.

## 2021-05-09 NOTE — Progress Notes (Signed)
Patient washed up post-procedure, bed pad changed and warm compress applied. Pressure dressing applied to right buttocks. Patient complaining of 9/10 pain. Pain medication administered and patient stated she felt relief.

## 2021-05-09 NOTE — Progress Notes (Signed)
Interpreter [phone], [name] has Interpreted that Pt. Is receiving Potassium as Per M.D. order. Pt. C/O Headache and Toradol will be given as per Pt. Agreement. Jello provided as per request.

## 2021-05-09 NOTE — Progress Notes (Addendum)
Progress Note    Jane Schultz  GQQ:[id number] DOB: Apr 22, 1980  DOA: 05/05/2021 PCP: Patient, No Pcp Per (Inactive)      Brief Narrative:    Medical records reviewed and are as summarized below:  Jane Schultz is a 41 y.o. female with medical history significant for type 2 diabetes mellitus, hypertension, asthma, deafness, who presented to the hospital because of pain in the left labia.  She was febrile and tachycardic in the emergency room with white cell count of 18,000.  She was admitted to the hospital for sepsis secondary to vulva/mons cellulitis.  She was treated with IV fluids, analgesics and empiric IV antibiotics.    ,      Assessment/Plan:   Principal Problem:   Cellulitis of labia majora Active Problems:   HTN (hypertension)   Deaf   Sepsis ([facility])   Hyperglycemia due to type 2 diabetes mellitus ([place])   Body mass index is 31.95 kg/m.  (Morbid obesity)  Sepsis secondary to left-sided vulva and mons pubis cellulitis with abscess: Consulted Dr. Marcelline Mates, gynecologist to assist with management.  Patient underwent I&D of abscess today.  Continue empiric IV antibiotics and analgesics.      Hyponatremia, nausea and vomiting: Improved  Hypokalemia: Replete potassium and monitor levels  Insulin-dependent type 2 DM with hyperglycemia: Glucose levels are improving.  She said she takes Lantus 30 units in the morning and 60 units in the evening.  However, in the hospital, she is requiring only 30 units of insulin glargine.  She has been counseled on the importance of adequate glucose control, dietary discretion and medical adherence.  Constipation: She has not had a bowel movement but she does not want any more laxatives.  Sign language interpretation service via iPad was used for this encounter.    Tillie Rung, RN nurse tech was at the bedside during this encounter   Diet Order             Diet Carb Modified Fluid consistency: Thin; Room  service appropriate? Yes  Diet effective now                      Consultants: Gynecologist  Procedures: None    Medications:    atorvastatin  20 mg Oral Daily   bisacodyl  10 mg Oral Once   enoxaparin (LOVENOX) injection  0.5 mg/kg Subcutaneous Q24H   influenza vac split quadrivalent PF  0.5 mL Intramuscular Tomorrow-1000   insulin aspart  0-20 Units Subcutaneous TID WC   insulin aspart  0-5 Units Subcutaneous QHS   lidocaine (PF)       lisinopril  10 mg Oral Daily   polyethylene glycol  17 g Oral Daily   sodium chloride flush  10 mL Intravenous Q12H   sodium chloride flush  10-40 mL Intracatheter Q12H   Zinc Oxide   Topical BID   Continuous Infusions:  sodium chloride 10 mL/hr at 05/09/21 1108   cefTRIAXone (ROCEPHIN)  IV Stopped (05/09/21 0134)   promethazine (PHENERGAN) injection (IM or IVPB)     vancomycin 1,000 mg (05/09/21 1512)     Anti-infectives (From admission, onward)    Start     Dose/Rate Route Frequency Ordered Stop   05/07/21 0100  cefTRIAXone (ROCEPHIN) 2 g in sodium chloride 0.9 % 100 mL IVPB        2 g 200 mL/hr over 30 Minutes Intravenous Every 24 hours 05/06/21 0245 05/13/21 0059   05/06/21 1500  vancomycin (VANCOCIN) IVPB 1000  mg/200 mL premix        1,000 mg 200 mL/hr over 60 Minutes Intravenous Every 12 hours 05/06/21 0344     05/06/21 0345  vancomycin (VANCOREADY) IVPB 1250 mg/250 mL        1,250 mg 166.7 mL/hr over 90 Minutes Intravenous  Once 05/06/21 0339 05/06/21 0659   05/06/21 0230  vancomycin (VANCOCIN) IVPB 1000 mg/200 mL premix        1,000 mg 200 mL/hr over 60 Minutes Intravenous  Once 05/06/21 0216 05/06/21 0332   05/06/21 0030  cefTRIAXone (ROCEPHIN) 2 g in sodium chloride 0.9 % 100 mL IVPB        2 g 200 mL/hr over 30 Minutes Intravenous  Once 05/06/21 0023 05/06/21 0122              Family Communication/Anticipated D/C date and plan/Code Status   DVT prophylaxis:      Code Status: Full Code  Family  Communication: None Disposition Plan: Plan to discharge home in 1 to 2 days   Status is: Inpatient  Remains inpatient appropriate because: IV antibiotics           Subjective:   Interval events noted.  She complains of worsening pain of the mons pubis and the left vulva.  No nausea or vomiting   Objective:    Vitals:   05/09/21 0047 05/09/21 0416 05/09/21 0750 05/09/21 1108  BP: 136/66 140/76 (!) 154/83 137/73  Pulse: 90 90 86 80  Resp: 20 18 18 20   Temp: 98.2 F (36.8 C) 98.2 F (36.8 C) 99 F (37.2 C) 99 F (37.2 C)  TempSrc: Oral Oral Oral Oral  SpO2: 100% 100% 100%   Weight:      Height:       No data found.   Intake/Output Summary (Last 24 hours) at 05/09/2021 1524 Last data filed at 05/09/2021 1407 Gross per 24 hour  Intake 1703.83 ml  Output 2400 ml  Net -696.17 ml   Filed Weights   05/06/21 0256  Weight: 92.5 kg    Exam:  GEN: NAD SKIN: Warm and dry EYES: No pallor or icterus ENT: MMM CV: RRR PULM: CTA B ABD: soft, obese, NT, +BS CNS: AAO x 3, non focal EXT: No edema or tenderness GU: Worsening induration, swelling and tenderness of the left vulvar, and mons pubis.  Swelling and induration of the mons pubis has extended to the right side.   Tillie Rung, RN, was at the bedside as a Medical sales representative Reviewed:   I have personally reviewed following labs and imaging studies:  Labs: Labs show the following:   Basic Metabolic Panel: Recent Labs  Lab 05/05/21 2025 05/06/21 0629 05/07/21 1146 05/08/21 0648 05/09/21 0533  NA 131* 131* 132* 133* 135  K 3.3* 3.5 3.1* 3.8 3.4*  CL 101 103 101 99 102  CO2 21* 21* 25 27 24   GLUCOSE 326* 323* 158* 230* 183*  BUN 10 10 11 14 14   CREATININE 0.74 0.71 0.72 0.81 0.77  CALCIUM 8.7* 8.0* 8.0* 8.2* 8.2*  MG  --   --  1.7 1.9 1.9  PHOS  --   --   --  2.9 2.8   GFR Estimated Creatinine Clearance: 110.3 mL/min (by C-G formula based on SCr of 0.77 mg/dL). Liver Function Tests: Recent Labs   Lab 05/05/21 2025  AST 36  ALT 30  ALKPHOS 79  BILITOT 0.8  PROT 7.3  ALBUMIN 3.5   No results for input(s): LIPASE,  AMYLASE in the last 168 hours. No results for input(s): AMMONIA in the last 168 hours. Coagulation profile Recent Labs  Lab 05/06/21 0629  INR 1.2    CBC: Recent Labs  Lab 05/05/21 2025 05/06/21 0629 05/07/21 1146 05/08/21 0648 05/09/21 0533  WBC 18.1* 16.9* 15.2* 12.6* 12.9*  NEUTROABS 15.6*  --  12.0* 10.5* 10.6*  HGB 13.3 12.3 11.5* 11.8* 11.2*  HCT 40.1 36.2 34.8* 35.9* 33.4*  MCV 82.0 82.3 83.1 83.1 81.1  PLT 219 179 172 184 212   Cardiac Enzymes: No results for input(s): CKTOTAL, CKMB, CKMBINDEX, TROPONINI in the last 168 hours. BNP (last 3 results) No results for input(s): PROBNP in the last 8760 hours. CBG: Recent Labs  Lab 05/08/21 1419 05/08/21 1923 05/08/21 2157 05/09/21 0848 05/09/21 1240  GLUCAP 209* 175* 186* 163* 220*   D-Dimer: No results for input(s): DDIMER in the last 72 hours. Hgb A1c: No results for input(s): HGBA1C in the last 72 hours.  Lipid Profile: No results for input(s): CHOL, HDL, LDLCALC, TRIG, CHOLHDL, LDLDIRECT in the last 72 hours. Thyroid function studies: No results for input(s): TSH, T4TOTAL, T3FREE, THYROIDAB in the last 72 hours.  Invalid input(s): FREET3 Anemia work up: No results for input(s): VITAMINB12, FOLATE, FERRITIN, TIBC, IRON, RETICCTPCT in the last 72 hours. Sepsis Labs: Recent Labs  Lab 05/06/21 0031 05/06/21 0629 05/07/21 1146 05/08/21 0648 05/09/21 0533  PROCALCITON  --  0.24  --   --   --   WBC  --  16.9* 15.2* 12.6* 12.9*  LATICACIDVEN 1.0 1.1  --   --   --     Microbiology Recent Results (from the past 240 hour(s))  Blood culture (routine x 2)     Status: None (Preliminary result)   Collection Time: 05/06/21 12:31 AM   Specimen: BLOOD  Result Value Ref Range Status   Specimen Description BLOOD LEFT ASSIST CONTROL  Final   Special Requests   Final    BOTTLES DRAWN  AEROBIC AND ANAEROBIC Blood Culture adequate volume   Culture   Final    NO GROWTH 3 DAYS Performed at [facility], [address] [place], [place] [address]    Report Status PENDING  Incomplete  Resp Panel by RT-PCR (Flu A&B, Covid) Nasopharyngeal Swab     Status: None   Collection Time: 05/06/21 12:31 AM   Specimen: Nasopharyngeal Swab; Nasopharyngeal(NP) swabs in vial transport medium  Result Value Ref Range Status   SARS Coronavirus 2 by RT PCR NEGATIVE NEGATIVE Final    Comment: (NOTE) SARS-CoV-2 target nucleic acids are NOT DETECTED.  The SARS-CoV-2 RNA is generally detectable in upper respiratory specimens during the acute phase of infection. The lowest concentration of SARS-CoV-2 viral copies this assay can detect is 138 copies/mL. A negative result does not preclude SARS-Cov-2 infection and should not be used as the sole basis for treatment or other patient management decisions. A negative result may occur with  improper specimen collection/handling, submission of specimen other than nasopharyngeal swab, presence of viral mutation(s) within the areas targeted by this assay, and inadequate number of viral copies(<138 copies/mL). A negative result must be combined with clinical observations, patient history, and epidemiological information. The expected result is Negative.  Fact Sheet for Patients:  EntrepreneurPulse.com.au  Fact Sheet for Healthcare Providers:  IncredibleEmployment.be  This test is no t yet approved or cleared by the [place] FDA and  has been authorized for detection and/or diagnosis of SARS-CoV-2 by FDA under an Emergency Use  Authorization (EUA). This EUA will remain  in effect (meaning this test can be used) for the duration of the COVID-19 declaration under Section 564(b)(1) of the Act, 21 U.S.C.section 360bbb-3(b)(1), unless the authorization is terminated  or revoked sooner.       Influenza A  by PCR NEGATIVE NEGATIVE Final   Influenza B by PCR NEGATIVE NEGATIVE Final    Comment: (NOTE) The Xpert Xpress SARS-CoV-2/FLU/RSV plus assay is intended as an aid in the diagnosis of influenza from Nasopharyngeal swab specimens and should not be used as a sole basis for treatment. Nasal washings and aspirates are unacceptable for Xpert Xpress SARS-CoV-2/FLU/RSV testing.  Fact Sheet for Patients: EntrepreneurPulse.com.au  Fact Sheet for Healthcare Providers: IncredibleEmployment.be  This test is not yet approved or cleared by the [place] FDA and has been authorized for detection and/or diagnosis of SARS-CoV-2 by FDA under an Emergency Use Authorization (EUA). This EUA will remain in effect (meaning this test can be used) for the duration of the COVID-19 declaration under Section 564(b)(1) of the Act, 21 U.S.C. section 360bbb-3(b)(1), unless the authorization is terminated or revoked.  Performed at [facility], [address] [place], [place] [address]   Culture, blood (Routine X 2) w Reflex to ID Panel     Status: None (Preliminary result)   Collection Time: 05/06/21  6:29 AM   Specimen: BLOOD  Result Value Ref Range Status   Specimen Description BLOOD RIGHT ANTECUBITAL  Final   Special Requests   Final    BOTTLES DRAWN AEROBIC AND ANAEROBIC Blood Culture adequate volume   Culture   Final    NO GROWTH 3 DAYS Performed at [facility], [address] [place], [place] [address]    Report Status PENDING  Incomplete    Procedures and diagnostic studies:  US PELVIS LIMITED (TRANSABDOMINAL ONLY)  Result Date: 05/08/2021 CLINICAL DATA:  Pain and swelling in the left labia EXAM: LIMITED ULTRASOUND OF PELVIS TECHNIQUE: Limited transabdominal ultrasound examination of the pelvis was performed. COMPARISON:  None. FINDINGS: There is marked subcutaneous edema and increased vascularity in the area of patient's symptoms in  the left labia. There is 3.5 x 2.3 x 3.1 cm area of inhomogeneous echogenicity without definite demonstrable internal vascularity approximately 9 mm deep to the skin in the subcutaneous plane in the area of patient's symptoms. IMPRESSION: There is marked subcutaneous edema in the left labia suggesting cellulitis. There is 3.5 x 2.3 x 3.1 cm area of inhomogeneous echogenicity with slightly indistinct margins in the area of patient's symptoms in the left labia. This may suggest inflammatory phlegmon or abscess. Electronically Signed   By: Elmer Picker M.D.   On: 05/08/2021 16:57               LOS: 3 days   Jayton Popelka  [facility]   Pager on [email or web address]. If 7PM-7AM, please contact night-coverage at [email or web address]     05/09/2021, 3:24 PM

## 2021-05-09 NOTE — Progress Notes (Signed)
Morton [name] NP notified of Pt. Temp. Of 100.0 at this time and continued c/o Headache.

## 2021-05-09 NOTE — Consult Note (Signed)
GYNECOLOGY CLINIC PROGRESS NOTE  ASL Interpreter Kathi # [id number] used for visit.   Jane Schultz is a 41 y.o. hearing impaired female admitted on 05/05/2021 with sepsis secondary to left-sided vulva and mons cellulitis and suspected abscess. Currently receiving IV Vancomycin and Rocephin. PMH includes Type II DM, HTN. Patient initially consulted on admission by GYN for abscess, and was advised to continue IV antibiotics. Re-consulted today due to worsening of cellulitis and concern for abscess on ultrasound.   Subjective: Worsening pain of left vulva. She also has complaints of another area on her right buttock. Otherwise no major issues.  Patient Active Problem List   Diagnosis Date Noted   Cellulitis of labia majora 05/06/2021   Sepsis ([place]) 05/06/2021   Hyperglycemia due to type 2 diabetes mellitus ([place]) 05/06/2021   Type 2 diabetes mellitus without complication ([place]) 94/76/5465   HTN (hypertension) 09/02/2020   Asthma 09/02/2020   Deaf 09/02/2020   Chest pain 09/02/2020    Past Medical History:  Diagnosis Date   Asthma    Deaf    Diabetes mellitus without complication ([place])    Hypertension     Past Surgical History:  Procedure Laterality Date   CESAREAN SECTION     Social History   Tobacco Use   Smoking status: Never   Smokeless tobacco: Never  Substance Use Topics   Alcohol use: Never     Medications:   lidocaine (PF)       atorvastatin  20 mg Oral Daily   bisacodyl  10 mg Oral Once   enoxaparin (LOVENOX) injection  0.5 mg/kg Subcutaneous Q24H   influenza vac split quadrivalent PF  0.5 mL Intramuscular Tomorrow-1000   insulin aspart  0-20 Units Subcutaneous TID WC   insulin aspart  0-5 Units Subcutaneous QHS   lisinopril  10 mg Oral Daily   polyethylene glycol  17 g Oral Daily   sodium chloride flush  10 mL Intravenous Q12H   sodium chloride flush  10-40 mL Intracatheter Q12H    sodium chloride 10 mL/hr at 05/09/21 1108   cefTRIAXone (ROCEPHIN)   IV Stopped (05/09/21 0134)   promethazine (PHENERGAN) injection (IM or IVPB)     vancomycin Stopped (05/09/21 0353)      Objective: Vitals:   05/09/21 0047 05/09/21 0416 05/09/21 0750 05/09/21 1108  BP: 136/66 140/76 (!) 154/83 137/73  Pulse: 90 90 86 80  Resp: 20 18 18 20   Temp: 98.2 F (36.8 C) 98.2 F (36.8 C) 99 F (37.2 C) 99 F (37.2 C)  TempSrc: Oral Oral Oral Oral  SpO2: 100% 100% 100%   Weight:      Height:         Physical Exam:  General: alert and no distress  Lungs: clear to auscultation bilaterally Heart: regular rate and rhythm, S1, S2 normal, no murmur, click, rub or gallop Abdomen: soft, non-tender; bowel sounds normal; no masses,  no organomegaly Pelvis: left vulva enlarged, mild to moderate tenderness, somewhat fluctuant with induration and edema present up to the mons pubis on left. No erythema. Right vulva overall appears normal. Right buttock with appears to be healing ulceration (likely old bed sore).  Extremities: DVT Evaluation: No cords or calf tenderness. No significant calf/ankle edema.  Labs:  CBC Latest Ref Rng & Units 05/09/2021 05/08/2021 05/07/2021  WBC 4.0 - 10.5 K/uL 12.9(H) 12.6(H) 15.2(H)  Hemoglobin 12.0 - 15.0 g/dL 11.2(L) 11.8(L) 11.5(L)  Hematocrit 36.0 - 46.0 % 33.4(L) 35.9(L) 34.8(L)  Platelets 150 - 400 K/uL 212  184 172    CMP Latest Ref Rng & Units 05/09/2021 05/08/2021 05/07/2021  Glucose 70 - 99 mg/dL 183(H) 230(H) 158(H)  BUN 6 - 20 mg/dL 14 14 11   Creatinine 0.44 - 1.00 mg/dL 0.77 0.81 0.72  Sodium 135 - 145 mmol/L 135 133(L) 132(L)  Potassium 3.5 - 5.1 mmol/L 3.4(L) 3.8 3.1(L)  Chloride 98 - 111 mmol/L 102 99 101  CO2 22 - 32 mmol/L 24 27 25   Calcium 8.9 - 10.3 mg/dL 8.2(L) 8.2(L) 8.0(L)  Total Protein 6.5 - 8.1 g/dL - - -  Total Bilirubin 0.3 - 1.2 mg/dL - - -  Alkaline Phos 38 - 126 U/L - - -  AST 15 - 41 U/L - - -  ALT 0 - 44 U/L - - -      Imaging:  US PELVIS LIMITED (TRANSABDOMINAL ONLY) CLINICAL DATA:  Pain  and swelling in the left labia  EXAM: LIMITED ULTRASOUND OF PELVIS  TECHNIQUE: Limited transabdominal ultrasound examination of the pelvis was performed.  COMPARISON:  None.  FINDINGS: There is marked subcutaneous edema and increased vascularity in the area of patient's symptoms in the left labia. There is 3.5 x 2.3 x 3.1 cm area of inhomogeneous echogenicity without definite demonstrable internal vascularity approximately 9 mm deep to the skin in the subcutaneous plane in the area of patient's symptoms.  IMPRESSION: There is marked subcutaneous edema in the left labia suggesting cellulitis. There is 3.5 x 2.3 x 3.1 cm area of inhomogeneous echogenicity with slightly indistinct margins in the area of patient's symptoms in the left labia. This may suggest inflammatory phlegmon or abscess.  Electronically Signed   By: Elmer Picker M.D.   On: 05/08/2021 16:57   Assessment/Plan: 1. Right vulvar cellulitis  with abscess - WBC trend improving, however clinically, patient's vulvar region worsening.  Likely needs treatment with I&D. Discussed bedside vs OR procedure.  Patient ok to attempt bedside first. Attempted at bedside successfully, with >100 cc of purulent frothy drainage with mild odor.  For warm compresses to area 3 x daily. Continue scheduled antibiotics. Wound culture obtained.  2. Right buttock bed sore - will treat with zinc oxide and pressure bandage. Advised on alternating positions while in bed.     Incision and Drainage Procedure Note  Pre-operative Diagnosis: Left vulvar abscess with cellulitis, Type II DM, sepsis on admission resolving  Post-operative Diagnosis: Same  Indications: Left vulvar abscess with cellulitis  Anesthesia: 1% plain lidocaine (8 ml)  Procedure Details  The procedure, risks and complications have been discussed in detail (including, but not limited to airway compromise, infection, bleeding) with the patient, and the patient has  signed consent to the procedure.  The skin was sterilely prepped and draped over the affected area in the usual fashion. After adequate local anesthesia, I&D with a #10 blade was performed on the left labia. Purulent drainage: present. The patient was observed until stable.  Findings: Purulent frothy drainage with mild odor, > 100 cc  EBL: minimal  Drains: None  Condition: Tolerated procedure well   Complications: none.    Rubie Maid, MD [facility]

## 2021-05-09 NOTE — Progress Notes (Signed)
Pharmacy Antibiotic Note  Jane Schultz is a 41 y.o. female admitted on 05/05/2021 with left-sided vulva and mons cellulitis. Concern for abscess. Pharmacy has been consulted for vancomycin dosing.  Plan: Continue vancomycin 1000 mg IV q12h  Spoke with MD. Continue IV antibiotics now given concern for abscess. Will check vancomycin levels given length of therapy with unknown duration at this time.  Vancomycin peak today at 1700 and trough tomorrow morning at 0200. Will adjust dose as needed.  Height: 5\' 7"  (170.2 cm) Weight: 92.5 kg (204 lb) IBW/kg (Calculated) : 61.6  Temp (24hrs), Avg:98.9 F (37.2 C), Min:98.2 F (36.8 C), Max:99.9 F (37.7 C)  Recent Labs  Lab 05/05/21 2025 05/06/21 0031 05/06/21 0629 05/07/21 1146 05/08/21 0648 05/09/21 0533  WBC 18.1*  --  16.9* 15.2* 12.6* 12.9*  CREATININE 0.74  --  0.71 0.72 0.81 0.77  LATICACIDVEN  --  1.0 1.1  --   --   --      Estimated Creatinine Clearance: 110.3 mL/min (by C-G formula based on SCr of 0.77 mg/dL).    No Known Allergies  Antimicrobials this admission: Vancomycin 1/19 >> Ceftriaxone 1/19 >>  Microbiology results: 1/19 BCx: no growth   Thank you for allowing pharmacy to be a part of this patients care.  Tawnya Crook, PharmD, BCPS Clinical Pharmacist 05/09/2021 8:57 AM

## 2021-05-10 DIAGNOSIS — L8990 Pressure ulcer of unspecified site, unspecified stage: Secondary | ICD-10-CM | POA: Insufficient documentation

## 2021-05-10 LAB — GLUCOSE, CAPILLARY
Glucose-Capillary: 184 mg/dL — ABNORMAL HIGH (ref 70–99)
Glucose-Capillary: 218 mg/dL — ABNORMAL HIGH (ref 70–99)
Glucose-Capillary: 173 mg/dL — ABNORMAL HIGH (ref 70–99)
Glucose-Capillary: 205 mg/dL — ABNORMAL HIGH (ref 70–99)

## 2021-05-10 LAB — CBC WITH DIFFERENTIAL/PLATELET
Abs Immature Granulocytes: 0.08 10*3/uL — ABNORMAL HIGH (ref 0.00–0.07)
Basophils Absolute: 0.1 10*3/uL (ref 0.0–0.1)
Basophils Relative: 1 %
Eosinophils Absolute: 0.1 10*3/uL (ref 0.0–0.5)
Eosinophils Relative: 1 %
HCT: 33.1 % — ABNORMAL LOW (ref 36.0–46.0)
Hemoglobin: 11.1 g/dL — ABNORMAL LOW (ref 12.0–15.0)
Immature Granulocytes: 1 %
Lymphocytes Relative: 8 %
Lymphs Abs: 1.1 10*3/uL (ref 0.7–4.0)
MCH: 27.2 pg (ref 26.0–34.0)
MCHC: 33.5 g/dL (ref 30.0–36.0)
MCV: 81.1 fL (ref 80.0–100.0)
Monocytes Absolute: 1.2 10*3/uL — ABNORMAL HIGH (ref 0.1–1.0)
Monocytes Relative: 9 %
Neutro Abs: 11 10*3/uL — ABNORMAL HIGH (ref 1.7–7.7)
Neutrophils Relative %: 80 %
Platelets: 218 10*3/uL (ref 150–400)
RBC: 4.08 MIL/uL (ref 3.87–5.11)
RDW: 13.6 % (ref 11.5–15.5)
WBC: 13.6 10*3/uL — ABNORMAL HIGH (ref 4.0–10.5)
nRBC: 0 % (ref 0.0–0.2)

## 2021-05-10 LAB — BASIC METABOLIC PANEL
Anion gap: 5 (ref 5–15)
BUN: 13 mg/dL (ref 6–20)
CO2: 24 mmol/L (ref 22–32)
Calcium: 7.9 mg/dL — ABNORMAL LOW (ref 8.9–10.3)
Chloride: 102 mmol/L (ref 98–111)
Creatinine, Ser: 0.8 mg/dL (ref 0.44–1.00)
GFR, Estimated: >60 mL/min (ref >60)
Glucose, Bld: 201 mg/dL — ABNORMAL HIGH (ref 70–99)
Potassium: 3.6 mmol/L (ref 3.5–5.1)
Sodium: 131 mmol/L — ABNORMAL LOW (ref 135–145)

## 2021-05-10 LAB — VANCOMYCIN, TROUGH: Vancomycin Tr: 8 ug/mL — ABNORMAL LOW (ref 15–20)

## 2021-05-10 MED ORDER — INSULIN ASPART 100 UNIT/ML IJ SOLN
4.0000 [IU] | Freq: Three times a day (TID) | INTRAMUSCULAR | Status: DC
  Administered 2021-05-11 – 2021-05-14 (×9): 4 [IU] via SUBCUTANEOUS
  Filled 2021-05-10 (×10): qty 1

## 2021-05-10 MED ORDER — BISACODYL 10 MG RE SUPP
10.0000 mg | Freq: Once | RECTAL | Status: AC
  Administered 2021-05-10: 10 mg via RECTAL
  Filled 2021-05-10: qty 1

## 2021-05-10 MED ORDER — ZINC OXIDE 20 % EX OINT
TOPICAL_OINTMENT | Freq: Two times a day (BID) | CUTANEOUS | Status: DC
  Filled 2021-05-10: qty 28.35
  Filled 2021-05-10 (×2): qty 30

## 2021-05-10 MED ORDER — VANCOMYCIN HCL 500 MG/100ML IV SOLN
500.0000 mg | Freq: Once | INTRAVENOUS | Status: AC
  Administered 2021-05-10: 500 mg via INTRAVENOUS
  Filled 2021-05-10: qty 100

## 2021-05-10 MED ORDER — VANCOMYCIN HCL 1500 MG/300ML IV SOLN
1500.0000 mg | Freq: Two times a day (BID) | INTRAVENOUS | Status: DC
  Administered 2021-05-10 – 2021-05-14 (×7): 1500 mg via INTRAVENOUS
  Filled 2021-05-10 (×9): qty 300

## 2021-05-10 NOTE — Progress Notes (Signed)
Inpatient Diabetes Program Recommendations  AACE/ADA: New Consensus Statement on Inpatient Glycemic Control   Target Ranges:  Prepandial:   less than 140 mg/dL      Peak postprandial:   less than 180 mg/dL (1-2 hours)      Critically ill patients:  140 - 180 mg/dL    Latest Reference Range & Units 05/09/21 08:48 05/09/21 12:40 05/09/21 17:51 05/09/21 22:06 05/10/21 09:03  Glucose-Capillary 70 - 99 mg/dL 163 (H)  Novolog 4 units 220 (H)  Novolog 7 units 198 (H)  Novolog 4 units 154 (H) 184 (H)  Novolog 4 units   Review of Glycemic Control  Diabetes history: DM2 Outpatient Diabetes medications: Latnus 30 units QAM, Lantus 60 units QPM, Humalog 20 units with lunch (not taking), Metformin 500 mg QAM, Metformin 1000 mg QPM Current orders for Inpatient glycemic control: Novolog 0-20 units TID with meals, Novolog 0-5 units QHS  Inpatient Diabetes Program Recommendations:    Insulin: Per chart, patient received one time dose of Semglee 30 units on 05/06/21 at 17:42 and no Semglee given since then. Fasting glucose 163 mg/dl on 05/09/21 and 184 mg/dl today and post prandial glucose consistently elevated. Please consider ordering Novolog 4 units TID with meals for meal coverage if patient eats at least 50% of meals.  Thanks, Barnie Alderman, RN, MSN, CDE Diabetes Coordinator Inpatient Diabetes Program [phone] (Team Pager from 8am to 5pm)

## 2021-05-10 NOTE — Progress Notes (Signed)
Pharmacy Antibiotic Note  Jane Schultz is a 41 y.o. female admitted on 05/05/2021 with left-sided vulva and mons cellulitis. Concern for abscess. Pharmacy has been consulted for vancomycin dosing.  Plan: Continue vancomycin 1000 mg IV q12h  Spoke with MD. Continue IV antibiotics now given concern for abscess. Will check vancomycin levels given length of therapy with unknown duration at this time.  Vancomycin peak today at 1700 and trough tomorrow morning at 0200. Will adjust dose as needed.  1/22:  Vanc peak @ 1726 = 19 1/23:  Vanc trough @ 0158 = 8 Calculated AUC = 314.3  Will increase dose to Vanc 1500 mg IV Q12H to start 1/23 @ 0300.  Will draw next peak and trough around 3rd new dose.   1/24:  Vanc peak @ 0600           Vanc trough @ 1430  Height: 5\' 7"  (170.2 cm) Weight: 92.5 kg (204 lb) IBW/kg (Calculated) : 61.6  Temp (24hrs), Avg:99.6 F (37.6 C), Min:98.2 F (36.8 C), Max:102.4 F (39.1 C)  Recent Labs  Lab 05/06/21 0031 05/06/21 0629 05/07/21 1146 05/08/21 0648 05/09/21 0533 05/09/21 1726 05/10/21 0158  WBC  --  16.9* 15.2* 12.6* 12.9*  --  13.6*  CREATININE  --  0.71 0.72 0.81 0.77  --  0.80  LATICACIDVEN 1.0 1.1  --   --   --   --   --   VANCOTROUGH  --   --   --   --   --   --  8*  VANCOPEAK  --   --   --   --   --  19*  --      Estimated Creatinine Clearance: 110.3 mL/min (by C-G formula based on SCr of 0.8 mg/dL).    No Known Allergies  Antimicrobials this admission: Vancomycin 1/19 >> Ceftriaxone 1/19 >>  Microbiology results: 1/19 BCx: no growth   Thank you for allowing pharmacy to be a part of this patients care.  Orene Desanctis, PharmD, BCPS Clinical Pharmacist 05/10/2021 3:31 AM

## 2021-05-10 NOTE — TOC Progression Note (Signed)
Transition of Care [facility]) - Progression Note    Patient Details  Name: Dalanie Kisner MRN: [id number] Date of Birth: 06-Oct-1980  Transition of Care [facility]) CM/SW Contact  Anselm Pancoast, RN Phone Number: 05/10/2021, 8:49 AM  Clinical Narrative:    Confirmed patient was set up through her Medicaid with [facility] office and has had several appointments made but has never showed up to the appointment. Possible issue is insurance is telling patient she is set up with health department however that is a different building. Confirmed if patient wants to select another PCP office the [place] office can assist her working with DSS-Medicaid to get PCP changed. Medicaid has transportation assistance as well to PCP office if needed.        Expected Discharge Plan and Services                                                 Social Determinants of Health (SDOH) Interventions    Readmission Risk Interventions No flowsheet data found.

## 2021-05-10 NOTE — Progress Notes (Signed)
Jane Schultz is a 41 y.o. hearing impaired female admitted on 05/05/2021 with sepsis secondary to left-sided vulva and mons cellulitis and suspected abscess. Currently receiving IV antibiotics. PMH includes Type II DM, HTN.  Post-Operative Day #1 s/p bedside I&D of vulvar abscess.   ASL interpreter used.   Subjective: up ad lib, voiding, and tolerating PO.  Notes that she is still sore in the vaginal area, but not as much as before.   Objective: Temp:  [98.9 F (37.2 C)-102.4 F (39.1 C)] 100.8 F (38.2 C) (01/23 1215) Pulse Rate:  [73-93] 93 (01/23 1215) Resp:  [18-22] 20 (01/23 0827) BP: (134-160)/(57-89) 134/89 (01/23 1215) SpO2:  [99 %-100 %] 100 % (01/23 0827)  Physical Exam:  General: alert and no distress  Lungs: clear to auscultation bilaterally Heart: regular rate and rhythm, S1, S2 normal, no murmur, click, rub or gallop Abdomen: soft, non-tender; bowel sounds normal; no masses,  no organomegaly Pelvis: external genitalia with moderate edema of left labia majora to mons, however not as tender as previous exam. Still small amount of purulent drainage present with massage.     Labs:  CBC Latest Ref Rng & Units 05/10/2021 05/09/2021 05/08/2021  WBC 4.0 - 10.5 K/uL 13.6(H) 12.9(H) 12.6(H)  Hemoglobin 12.0 - 15.0 g/dL 11.1(L) 11.2(L) 11.8(L)  Hematocrit 36.0 - 46.0 % 33.1(L) 33.4(L) 35.9(L)  Platelets 150 - 400 K/uL 218 212 184     Lab Results  Component Value Date   CREATININE 0.80 05/10/2021     Assessment/Plan:  1. Right vulvar cellulitis  with abscess - WBC slowly trending upward again, patient with low grade temp earlier today. However clinically, patient's vulvar region slowly improving.  Continuing to drain.   For warm compresses to area 2- 3 x daily. Continue scheduled antibiotics. Wound culture pending.  2. Right buttock bed sore - continue to treat with zinc oxide and pressure bandage. Advised on alternating positions while in bed.      LOS:   [place], MD [facility]

## 2021-05-10 NOTE — Progress Notes (Signed)
Progress Note    Jane Schultz  XIP:[id number] DOB: [birth year]  DOA: 05/05/2021 PCP: Patient, No Pcp Per (Inactive)      Brief Narrative:    Medical records reviewed and are as summarized below:  Jane Schultz is a 41 y.o. female with medical history significant for type 2 diabetes mellitus, hypertension, asthma, deafness, who presented to the hospital because of pain in the left labia.  She was febrile and tachycardic in the emergency room with white cell count of 18,000.  She was admitted to the hospital for sepsis secondary to vulva/mons cellulitis.  She was treated with IV fluids, analgesics and empiric IV antibiotics.    ,      Assessment/Plan:   Principal Problem:   Cellulitis of labia majora Active Problems:   HTN (hypertension)   Deaf   Sepsis ([place])   Hyperglycemia due to type 2 diabetes mellitus ([place])   Pressure injury of skin   Body mass index is 31.95 kg/m.  (Morbid obesity)  Sepsis secondary to left-sided vulva/labia and mons pubis cellulitis with abscess, fever, leukocytosis : S/p I&D of left labia abscess on 05/09/2021.  Continue empiric IV antibiotics.  Follow-up wound culture.  Hyponatremia: Monitor BMP  Hypokalemia, nausea and vomiting: Improved  Insulin-dependent type 2 DM with hyperglycemia: Continue insulin glargine and NovoLog as needed for hyperglycemia   Constipation: She has decided to use suppository to help with constipation.  Dulcolax suppository has been ordered.  American sign language interpreter via iPad was used for this encounter  [place], RN and patient's fianc were at the bedside during this encounter  Diet Order             Diet Carb Modified Fluid consistency: Thin; Room service appropriate? Yes  Diet effective now                      Consultants: Gynecologist  Procedures: None    Medications:    atorvastatin  20 mg Oral Daily   bisacodyl  10 mg Oral Once   enoxaparin  (LOVENOX) injection  0.5 mg/kg Subcutaneous Q24H   influenza vac split quadrivalent PF  0.5 mL Intramuscular Tomorrow-1000   insulin aspart  0-20 Units Subcutaneous TID WC   insulin aspart  0-5 Units Subcutaneous QHS   lisinopril  10 mg Oral Daily   metoprolol tartrate  12.5 mg Oral BID   polyethylene glycol  17 g Oral Daily   sodium chloride flush  10 mL Intravenous Q12H   sodium chloride flush  10-40 mL Intracatheter Q12H   Zinc Oxide   Topical BID   Continuous Infusions:  sodium chloride 10 mL/hr at 05/09/21 1108   cefTRIAXone (ROCEPHIN)  IV 2 g (05/10/21 0104)   promethazine (PHENERGAN) injection (IM or IVPB)     vancomycin 1,500 mg (05/10/21 1456)     Anti-infectives (From admission, onward)    Start     Dose/Rate Route Frequency Ordered Stop   05/10/21 1500  vancomycin (VANCOREADY) IVPB 1500 mg/300 mL        1,500 mg 150 mL/hr over 120 Minutes Intravenous Every 12 hours 05/10/21 0327     05/10/21 0415  vancomycin (VANCOREADY) IVPB 500 mg/100 mL        500 mg 100 mL/hr over 60 Minutes Intravenous  Once 05/10/21 0324 05/10/21 0517   05/07/21 0100  cefTRIAXone (ROCEPHIN) 2 g in sodium chloride 0.9 % 100 mL IVPB        2 g 200  mL/hr over 30 Minutes Intravenous Every 24 hours 05/06/21 0245 05/13/21 0059   05/06/21 1500  vancomycin (VANCOCIN) IVPB 1000 mg/200 mL premix  Status:  Discontinued        1,000 mg 200 mL/hr over 60 Minutes Intravenous Every 12 hours 05/06/21 0344 05/10/21 1112   05/06/21 0345  vancomycin (VANCOREADY) IVPB 1250 mg/250 mL        1,250 mg 166.7 mL/hr over 90 Minutes Intravenous  Once 05/06/21 0339 05/06/21 0659   05/06/21 0230  vancomycin (VANCOCIN) IVPB 1000 mg/200 mL premix        1,000 mg 200 mL/hr over 60 Minutes Intravenous  Once 05/06/21 0216 05/06/21 0332   05/06/21 0030  cefTRIAXone (ROCEPHIN) 2 g in sodium chloride 0.9 % 100 mL IVPB        2 g 200 mL/hr over 30 Minutes Intravenous  Once 05/06/21 0023 05/06/21 0122               Family Communication/Anticipated D/C date and plan/Code Status   DVT prophylaxis:      Code Status: Full Code  Family Communication: None Disposition Plan: Plan to discharge home in 1 to 2 days   Status is: Inpatient  Remains inpatient appropriate because: IV antibiotics           Subjective:   She had fever last night and today.  T-max was 102.4 F.  She still complains of pain in the mons pubis and left labia.  She has intermittent headache which she attributes to not sleeping well at night   Objective:    Vitals:   05/09/21 2339 05/10/21 0259 05/10/21 0827 05/10/21 1215  BP: 140/71 138/78 (!) 142/57 134/89  Pulse: 90 91 89 93  Resp: 20 18 20    Temp: 99.8 F (37.7 C) 99 F (37.2 C) 98.9 F (37.2 C) (!) 100.8 F (38.2 C)  TempSrc: Oral   Oral  SpO2: 99% 100% 100%   Weight:      Height:       No data found.   Intake/Output Summary (Last 24 hours) at 05/10/2021 1631 Last data filed at 05/10/2021 1200 Gross per 24 hour  Intake 640 ml  Output 1000 ml  Net -360 ml   Filed Weights   05/06/21 0256  Weight: 92.5 kg    Exam:   GEN: NAD SKIN: Warm and dry.  Stage II decubitus ulcer on right buttock EYES: EOMI ENT: MMM CV: RRR PULM: CTA B ABD: soft, obese, NT, +BS CNS: AAO x 3, non focal EXT: No edema or tenderness GU: Induration, swelling and tenderness of the mons pubis and the left labia  Madelyn, RN, at the bedside as a chaperone  Data Reviewed:   I have personally reviewed following labs and imaging studies:  Labs: Labs show the following:   Basic Metabolic Panel: Recent Labs  Lab 05/06/21 0629 05/07/21 1146 05/08/21 0648 05/09/21 0533 05/10/21 0158  NA 131* 132* 133* 135 131*  K 3.5 3.1* 3.8 3.4* 3.6  CL 103 101 99 102 102  CO2 21* 25 27 24 24   GLUCOSE 323* 158* 230* 183* 201*  BUN 10 11 14 14 13   CREATININE 0.71 0.72 0.81 0.77 0.80  CALCIUM 8.0* 8.0* 8.2* 8.2* 7.9*  MG  --  1.7 1.9 1.9  --   PHOS  --    --  2.9 2.8  --    GFR Estimated Creatinine Clearance: 110.3 mL/min (by C-G formula based on SCr of 0.8 mg/dL). Liver Function  Tests: Recent Labs  Lab 05/05/21 2025  AST 36  ALT 30  ALKPHOS 79  BILITOT 0.8  PROT 7.3  ALBUMIN 3.5   No results for input(s): LIPASE, AMYLASE in the last 168 hours. No results for input(s): AMMONIA in the last 168 hours. Coagulation profile Recent Labs  Lab 05/06/21 0629  INR 1.2    CBC: Recent Labs  Lab 05/05/21 2025 05/06/21 0629 05/07/21 1146 05/08/21 0648 05/09/21 0533 05/10/21 0158  WBC 18.1* 16.9* 15.2* 12.6* 12.9* 13.6*  NEUTROABS 15.6*  --  12.0* 10.5* 10.6* 11.0*  HGB 13.3 12.3 11.5* 11.8* 11.2* 11.1*  HCT 40.1 36.2 34.8* 35.9* 33.4* 33.1*  MCV 82.0 82.3 83.1 83.1 81.1 81.1  PLT 219 179 172 184 212 218   Cardiac Enzymes: No results for input(s): CKTOTAL, CKMB, CKMBINDEX, TROPONINI in the last 168 hours. BNP (last 3 results) No results for input(s): PROBNP in the last 8760 hours. CBG: Recent Labs  Lab 05/09/21 1240 05/09/21 1751 05/09/21 2206 05/10/21 0903 05/10/21 1321  GLUCAP 220* 198* 154* 184* 218*   D-Dimer: No results for input(s): DDIMER in the last 72 hours. Hgb A1c: No results for input(s): HGBA1C in the last 72 hours.  Lipid Profile: No results for input(s): CHOL, HDL, LDLCALC, TRIG, CHOLHDL, LDLDIRECT in the last 72 hours. Thyroid function studies: No results for input(s): TSH, T4TOTAL, T3FREE, THYROIDAB in the last 72 hours.  Invalid input(s): FREET3 Anemia work up: No results for input(s): VITAMINB12, FOLATE, FERRITIN, TIBC, IRON, RETICCTPCT in the last 72 hours. Sepsis Labs: Recent Labs  Lab 05/06/21 0031 05/06/21 0629 05/07/21 1146 05/08/21 0648 05/09/21 0533 05/10/21 0158  PROCALCITON  --  0.24  --   --   --   --   WBC  --  16.9* 15.2* 12.6* 12.9* 13.6*  LATICACIDVEN 1.0 1.1  --   --   --   --     Microbiology Recent Results (from the past 240 hour(s))  Blood culture (routine x 2)      Status: None (Preliminary result)   Collection Time: 05/06/21 12:31 AM   Specimen: BLOOD  Result Value Ref Range Status   Specimen Description BLOOD LEFT ASSIST CONTROL  Final   Special Requests   Final    BOTTLES DRAWN AEROBIC AND ANAEROBIC Blood Culture adequate volume   Culture   Final    NO GROWTH 4 DAYS Performed at [facility], [address] [place], [place] [address]    Report Status PENDING  Incomplete  Resp Panel by RT-PCR (Flu A&B, Covid) Nasopharyngeal Swab     Status: None   Collection Time: 05/06/21 12:31 AM   Specimen: Nasopharyngeal Swab; Nasopharyngeal(NP) swabs in vial transport medium  Result Value Ref Range Status   SARS Coronavirus 2 by RT PCR NEGATIVE NEGATIVE Final    Comment: (NOTE) SARS-CoV-2 target nucleic acids are NOT DETECTED.  The SARS-CoV-2 RNA is generally detectable in upper respiratory specimens during the acute phase of infection. The lowest concentration of SARS-CoV-2 viral copies this assay can detect is 138 copies/mL. A negative result does not preclude SARS-Cov-2 infection and should not be used as the sole basis for treatment or other patient management decisions. A negative result may occur with  improper specimen collection/handling, submission of specimen other than nasopharyngeal swab, presence of viral mutation(s) within the areas targeted by this assay, and inadequate number of viral copies(<138 copies/mL). A negative result must be combined with clinical observations, patient history, and epidemiological information. The expected result  is Negative.  Fact Sheet for Patients:  EntrepreneurPulse.com.au  Fact Sheet for Healthcare Providers:  IncredibleEmployment.be  This test is no t yet approved or cleared by the [place] FDA and  has been authorized for detection and/or diagnosis of SARS-CoV-2 by FDA under an Emergency Use Authorization (EUA). This EUA will remain  in effect  (meaning this test can be used) for the duration of the COVID-19 declaration under Section 564(b)(1) of the Act, 21 U.S.C.section 360bbb-3(b)(1), unless the authorization is terminated  or revoked sooner.       Influenza A by PCR NEGATIVE NEGATIVE Final   Influenza B by PCR NEGATIVE NEGATIVE Final    Comment: (NOTE) The Xpert Xpress SARS-CoV-2/FLU/RSV plus assay is intended as an aid in the diagnosis of influenza from Nasopharyngeal swab specimens and should not be used as a sole basis for treatment. Nasal washings and aspirates are unacceptable for Xpert Xpress SARS-CoV-2/FLU/RSV testing.  Fact Sheet for Patients: EntrepreneurPulse.com.au  Fact Sheet for Healthcare Providers: IncredibleEmployment.be  This test is not yet approved or cleared by the [place] FDA and has been authorized for detection and/or diagnosis of SARS-CoV-2 by FDA under an Emergency Use Authorization (EUA). This EUA will remain in effect (meaning this test can be used) for the duration of the COVID-19 declaration under Section 564(b)(1) of the Act, 21 U.S.C. section 360bbb-3(b)(1), unless the authorization is terminated or revoked.  Performed at [facility], [address] [place], [place] [address]   Culture, blood (Routine X 2) w Reflex to ID Panel     Status: None (Preliminary result)   Collection Time: 05/06/21  6:29 AM   Specimen: BLOOD  Result Value Ref Range Status   Specimen Description BLOOD RIGHT ANTECUBITAL  Final   Special Requests   Final    BOTTLES DRAWN AEROBIC AND ANAEROBIC Blood Culture adequate volume   Culture   Final    NO GROWTH 4 DAYS Performed at [facility], [address] [place], [place] [address]    Report Status PENDING  Incomplete  Aerobic/Anaerobic Culture w Gram Stain (surgical/deep wound)     Status: None (Preliminary result)   Collection Time: 05/09/21  3:06 PM   Specimen: Vulva; Abscess  Result Value  Ref Range Status   Specimen Description   Final    VULVA Performed at [facility], [address] [place], [place] [address]    Special Requests   Final    NONE Performed at [facility], [place], [place] [address]    Gram Stain   Final    RARE WBC PRESENT,BOTH PMN AND MONONUCLEAR NO ORGANISMS SEEN    Culture   Final    NO GROWTH < 12 HOURS Performed at [facility], [place] [address], [place],  [address]    Report Status PENDING  Incomplete    Procedures and diagnostic studies:  No results found.             LOS: 4 days   Ryan Palermo  [facility]   Pager on [email or web address]. If 7PM-7AM, please contact night-coverage at [email or web address]     05/10/2021, 4:31 PM

## 2021-05-11 LAB — CULTURE, BLOOD (ROUTINE X 2)
Culture: NO GROWTH
Culture: NO GROWTH
Special Requests: ADEQUATE
Special Requests: ADEQUATE

## 2021-05-11 LAB — GLUCOSE, CAPILLARY
Glucose-Capillary: 139 mg/dL — ABNORMAL HIGH (ref 70–99)
Glucose-Capillary: 182 mg/dL — ABNORMAL HIGH (ref 70–99)
Glucose-Capillary: 133 mg/dL — ABNORMAL HIGH (ref 70–99)
Glucose-Capillary: 154 mg/dL — ABNORMAL HIGH (ref 70–99)

## 2021-05-11 LAB — CBC WITH DIFFERENTIAL/PLATELET
Abs Immature Granulocytes: 0.18 10*3/uL — ABNORMAL HIGH (ref 0.00–0.07)
Basophils Absolute: 0.1 10*3/uL (ref 0.0–0.1)
Basophils Relative: 0 %
Eosinophils Absolute: 0.2 10*3/uL (ref 0.0–0.5)
Eosinophils Relative: 2 %
HCT: 32.3 % — ABNORMAL LOW (ref 36.0–46.0)
Hemoglobin: 10.9 g/dL — ABNORMAL LOW (ref 12.0–15.0)
Immature Granulocytes: 1 %
Lymphocytes Relative: 11 %
Lymphs Abs: 1.6 10*3/uL (ref 0.7–4.0)
MCH: 27.4 pg (ref 26.0–34.0)
MCHC: 33.7 g/dL (ref 30.0–36.0)
MCV: 81.2 fL (ref 80.0–100.0)
Monocytes Absolute: 1.4 10*3/uL — ABNORMAL HIGH (ref 0.1–1.0)
Monocytes Relative: 10 %
Neutro Abs: 11.4 10*3/uL — ABNORMAL HIGH (ref 1.7–7.7)
Neutrophils Relative %: 76 %
Platelets: 242 10*3/uL (ref 150–400)
RBC: 3.98 MIL/uL (ref 3.87–5.11)
RDW: 13.8 % (ref 11.5–15.5)
WBC: 14.8 10*3/uL — ABNORMAL HIGH (ref 4.0–10.5)
nRBC: 0 % (ref 0.0–0.2)

## 2021-05-11 LAB — BASIC METABOLIC PANEL
Anion gap: 9 (ref 5–15)
BUN: 12 mg/dL (ref 6–20)
CO2: 25 mmol/L (ref 22–32)
Calcium: 8.2 mg/dL — ABNORMAL LOW (ref 8.9–10.3)
Chloride: 101 mmol/L (ref 98–111)
Creatinine, Ser: 0.96 mg/dL (ref 0.44–1.00)
GFR, Estimated: >60 mL/min (ref >60)
Glucose, Bld: 155 mg/dL — ABNORMAL HIGH (ref 70–99)
Potassium: 3.9 mmol/L (ref 3.5–5.1)
Sodium: 135 mmol/L (ref 135–145)

## 2021-05-11 MED ORDER — SODIUM CHLORIDE 0.9 % IV SOLN
3.0000 g | Freq: Four times a day (QID) | INTRAVENOUS | Status: DC
  Administered 2021-05-11 – 2021-05-14 (×10): 3 g via INTRAVENOUS
  Filled 2021-05-11 (×2): qty 3
  Filled 2021-05-11: qty 8
  Filled 2021-05-11: qty 3
  Filled 2021-05-11: qty 8
  Filled 2021-05-11 (×5): qty 3
  Filled 2021-05-11 (×2): qty 8
  Filled 2021-05-11 (×2): qty 3

## 2021-05-11 NOTE — Progress Notes (Signed)
Patient up to bathroom and pulled string. NA and this RN went to patients room to check on her. She needed help with some peri care and changing her pads out. Patient seemed to be hurting or distressed. Once patient was back in bed ASL on the interpreter services were used to communicate with patient. Patient stated she "felt weird", I asked her to elaborate and she stated that her left leg looked like it had increased swelling and that her abscess still looked red. I explained that the doctors looked at it today and that it still seemed to look like it did the night before when I took care of her. She explains to me "she is worried about the infection spreading to other places", I explained that the doctors were monitoring it and she was on antibiotics to help control the infection. She mentioned that she "wants to get this abscess better so she can get home to her son and family". While talking about family and getting home, patient got tearful. I provided supportive communication, acknowledgment of feelings and understanding. She also explained that she was hurting in her left lower back/side. She stated "it felt like her left kidney was hurting". I asked if she had been having any urinary symptoms or changes, she stated "it still felt normal". I then asked patient if it felt like a pulled muscle she stated "she couldn't tell but it hurt with movement". I replied and said it may be something muscular and we will get you some pain medicine and a warm blanket to put on there to see if that helps, patient agreed. Patient requested Tylenol. Explained to patient we will try to manage her pain and I will pass along her concerns. Will continue to monitor patient.

## 2021-05-11 NOTE — Progress Notes (Signed)
Called and verified with pharmacy compatibility of dilaudid and vancomycin. Pharmacist stated to pause vancomycin, flush, give dilaudid, flush, and restart vancomycin.   Pt requested dilaudid after vancomycin already started. Pt declined norco due to making her nauseous previously.    Will give dilaudid now for 9/10 pain in genitalia area.

## 2021-05-11 NOTE — Progress Notes (Addendum)
Progress Note    Jane Schultz  UEA:[id number] DOB: 1980-09-24  DOA: 05/05/2021 PCP: Patient, No Pcp Per (Inactive)      Brief Narrative:    Medical records reviewed and are as summarized below:  Kayci Woodstock is a 41 y.o. female with medical history significant for type 2 diabetes mellitus, hypertension, asthma, deafness, who presented to the hospital because of pain in the left labia.  She was febrile and tachycardic in the emergency room with white cell count of 18,000.  She was admitted to the hospital for sepsis secondary to vulva/mons cellulitis.  She was treated with IV fluids, analgesics and empiric IV antibiotics.    ,      Assessment/Plan:   Principal Problem:   Cellulitis of labia majora Active Problems:   HTN (hypertension)   Deaf   Sepsis ([place])   Hyperglycemia due to type 2 diabetes mellitus ([place])   Pressure injury of skin   Body mass index is 31.95 kg/m.  (Morbid obesity)  Sepsis secondary to left-sided vulva/labia and mons pubis cellulitis with abscess, fever, leukocytosis : S/p I&D of left labia abscess on 05/09/2021.  Continue empiric IV antibiotics and analgesics.  Continue warm compresses as needed.  Follow-up with cultures.  Follow-up with gastroenterologist for further recommendations.  Insulin-dependent type 2 DM with hyperglycemia: Continue insulin glargine and NovoLog as needed for hyperglycemia   Constipation: Improving.  Continue laxatives as needed.  Nausea, vomiting, hyponatremia and hypokalemia: Improved: Monitor BMP  Sign language interpretation service via iPad was used for this encounter  Diet Order             Diet Carb Modified Fluid consistency: Thin; Room service appropriate? Yes  Diet effective now                      Consultants: Gynecologist  Procedures: I&D of left labial abscess on 05/10/2021    Medications:    atorvastatin  20 mg Oral Daily   bisacodyl  10 mg Oral Once    enoxaparin (LOVENOX) injection  0.5 mg/kg Subcutaneous Q24H   influenza vac split quadrivalent PF  0.5 mL Intramuscular Tomorrow-1000   insulin aspart  0-20 Units Subcutaneous TID WC   insulin aspart  0-5 Units Subcutaneous QHS   insulin aspart  4 Units Subcutaneous TID WC   lisinopril  10 mg Oral Daily   metoprolol tartrate  12.5 mg Oral BID   polyethylene glycol  17 g Oral Daily   sodium chloride flush  10 mL Intravenous Q12H   sodium chloride flush  10-40 mL Intracatheter Q12H   zinc oxide   Topical BID   Continuous Infusions:  sodium chloride Stopped (05/11/21 0554)   cefTRIAXone (ROCEPHIN)  IV Stopped (05/11/21 0145)   promethazine (PHENERGAN) injection (IM or IVPB)     vancomycin Stopped (05/11/21 0553)     Anti-infectives (From admission, onward)    Start     Dose/Rate Route Frequency Ordered Stop   05/10/21 1500  vancomycin (VANCOREADY) IVPB 1500 mg/300 mL        1,500 mg 150 mL/hr over 120 Minutes Intravenous Every 12 hours 05/10/21 0327     05/10/21 0415  vancomycin (VANCOREADY) IVPB 500 mg/100 mL        500 mg 100 mL/hr over 60 Minutes Intravenous  Once 05/10/21 0324 05/10/21 0517   05/07/21 0100  cefTRIAXone (ROCEPHIN) 2 g in sodium chloride 0.9 % 100 mL IVPB  2 g 200 mL/hr over 30 Minutes Intravenous Every 24 hours 05/06/21 0245 05/13/21 0059   05/06/21 1500  vancomycin (VANCOCIN) IVPB 1000 mg/200 mL premix  Status:  Discontinued        1,000 mg 200 mL/hr over 60 Minutes Intravenous Every 12 hours 05/06/21 0344 05/10/21 1112   05/06/21 0345  vancomycin (VANCOREADY) IVPB 1250 mg/250 mL        1,250 mg 166.7 mL/hr over 90 Minutes Intravenous  Once 05/06/21 0339 05/06/21 0659   05/06/21 0230  vancomycin (VANCOCIN) IVPB 1000 mg/200 mL premix        1,000 mg 200 mL/hr over 60 Minutes Intravenous  Once 05/06/21 0216 05/06/21 0332   05/06/21 0030  cefTRIAXone (ROCEPHIN) 2 g in sodium chloride 0.9 % 100 mL IVPB        2 g 200 mL/hr over 30 Minutes Intravenous   Once 05/06/21 0023 05/06/21 0122              Family Communication/Anticipated D/C date and plan/Code Status   DVT prophylaxis:      Code Status: Full Code  Family Communication: None Disposition Plan: Plan to discharge home in 1 to 2 days   Status is: Inpatient  Remains inpatient appropriate because: IV antibiotics           Subjective:   C/o pain in the left labia no mons pubis.  She was able to have a small bowel movement yesterday and today.  Her nurse and fianc were at the bedside.   Objective:    Vitals:   05/10/21 2009 05/10/21 2214 05/11/21 0328 05/11/21 0804  BP: 140/64 138/60 (!) 143/69 133/79  Pulse: 91 90 87 84  Resp:  20 20 20   Temp: 99.1 F (37.3 C) 99 F (37.2 C) 99.5 F (37.5 C) 99.5 F (37.5 C)  TempSrc: Oral  Oral   SpO2: 96% 98% 99% 98%  Weight:      Height:       No data found.   Intake/Output Summary (Last 24 hours) at 05/11/2021 1140 Last data filed at 05/11/2021 0830 Gross per 24 hour  Intake 1040.92 ml  Output 400 ml  Net 640.92 ml   Filed Weights   05/06/21 0256  Weight: 92.5 kg    Exam:  GEN: NAD SKIN: Stage II decubitus ulcer on right buttock EYES: No pallor or icterus ENT: MMM CV: RRR PULM: CTA B ABD: soft, obese, NT, +BS CNS: AAO x 3, non focal EXT: No edema or tenderness GU: Induration, swelling and tenderness of the mons pubis on the left labia.  Foul-smelling serosanguineous drainage from the incisional wound on the left labia    [place], RN, at the bedside as a Cabin crew Reviewed:   I have personally reviewed following labs and imaging studies:  Labs: Labs show the following:   Basic Metabolic Panel: Recent Labs  Lab 05/07/21 1146 05/08/21 0648 05/09/21 0533 05/10/21 0158 05/11/21 0610  NA 132* 133* 135 131* 135  K 3.1* 3.8 3.4* 3.6 3.9  CL 101 99 102 102 101  CO2 25 27 24 24 25   GLUCOSE 158* 230* 183* 201* 155*  BUN 11 14 14 13 12   CREATININE 0.72 0.81 0.77 0.80  0.96  CALCIUM 8.0* 8.2* 8.2* 7.9* 8.2*  MG 1.7 1.9 1.9  --   --   PHOS  --  2.9 2.8  --   --    GFR Estimated Creatinine Clearance: 91.9 mL/min (by C-G  formula based on SCr of 0.96 mg/dL). Liver Function Tests: Recent Labs  Lab 05/05/21 2025  AST 36  ALT 30  ALKPHOS 79  BILITOT 0.8  PROT 7.3  ALBUMIN 3.5   No results for input(s): LIPASE, AMYLASE in the last 168 hours. No results for input(s): AMMONIA in the last 168 hours. Coagulation profile Recent Labs  Lab 05/06/21 0629  INR 1.2    CBC: Recent Labs  Lab 05/07/21 1146 05/08/21 0648 05/09/21 0533 05/10/21 0158 05/11/21 0610  WBC 15.2* 12.6* 12.9* 13.6* 14.8*  NEUTROABS 12.0* 10.5* 10.6* 11.0* 11.4*  HGB 11.5* 11.8* 11.2* 11.1* 10.9*  HCT 34.8* 35.9* 33.4* 33.1* 32.3*  MCV 83.1 83.1 81.1 81.1 81.2  PLT 172 184 212 218 242   Cardiac Enzymes: No results for input(s): CKTOTAL, CKMB, CKMBINDEX, TROPONINI in the last 168 hours. BNP (last 3 results) No results for input(s): PROBNP in the last 8760 hours. CBG: Recent Labs  Lab 05/10/21 0903 05/10/21 1321 05/10/21 1939 05/10/21 2216 05/11/21 0825  GLUCAP 184* 218* 173* 205* 139*   D-Dimer: No results for input(s): DDIMER in the last 72 hours. Hgb A1c: No results for input(s): HGBA1C in the last 72 hours.  Lipid Profile: No results for input(s): CHOL, HDL, LDLCALC, TRIG, CHOLHDL, LDLDIRECT in the last 72 hours. Thyroid function studies: No results for input(s): TSH, T4TOTAL, T3FREE, THYROIDAB in the last 72 hours.  Invalid input(s): FREET3 Anemia work up: No results for input(s): VITAMINB12, FOLATE, FERRITIN, TIBC, IRON, RETICCTPCT in the last 72 hours. Sepsis Labs: Recent Labs  Lab 05/06/21 0031 05/06/21 0629 05/07/21 1146 05/08/21 0648 05/09/21 0533 05/10/21 0158 05/11/21 0610  PROCALCITON  --  0.24  --   --   --   --   --   WBC  --  16.9*   < > 12.6* 12.9* 13.6* 14.8*  LATICACIDVEN 1.0 1.1  --   --   --   --   --    < > = values in this  interval not displayed.    Microbiology Recent Results (from the past 240 hour(s))  Blood culture (routine x 2)     Status: None   Collection Time: 05/06/21 12:31 AM   Specimen: BLOOD  Result Value Ref Range Status   Specimen Description BLOOD LEFT ASSIST CONTROL  Final   Special Requests   Final    BOTTLES DRAWN AEROBIC AND ANAEROBIC Blood Culture adequate volume   Culture   Final    NO GROWTH 5 DAYS Performed at [facility], [address] [place], [place] [address]    Report Status 05/11/2021 FINAL  Final  Resp Panel by RT-PCR (Flu A&B, Covid) Nasopharyngeal Swab     Status: None   Collection Time: 05/06/21 12:31 AM   Specimen: Nasopharyngeal Swab; Nasopharyngeal(NP) swabs in vial transport medium  Result Value Ref Range Status   SARS Coronavirus 2 by RT PCR NEGATIVE NEGATIVE Final    Comment: (NOTE) SARS-CoV-2 target nucleic acids are NOT DETECTED.  The SARS-CoV-2 RNA is generally detectable in upper respiratory specimens during the acute phase of infection. The lowest concentration of SARS-CoV-2 viral copies this assay can detect is 138 copies/mL. A negative result does not preclude SARS-Cov-2 infection and should not be used as the sole basis for treatment or other patient management decisions. A negative result may occur with  improper specimen collection/handling, submission of specimen other than nasopharyngeal swab, presence of viral mutation(s) within the areas targeted by this assay, and inadequate number  of viral copies(<138 copies/mL). A negative result must be combined with clinical observations, patient history, and epidemiological information. The expected result is Negative.  Fact Sheet for Patients:  EntrepreneurPulse.com.au  Fact Sheet for Healthcare Providers:  IncredibleEmployment.be  This test is no t yet approved or cleared by the [place] FDA and  has been authorized for detection and/or  diagnosis of SARS-CoV-2 by FDA under an Emergency Use Authorization (EUA). This EUA will remain  in effect (meaning this test can be used) for the duration of the COVID-19 declaration under Section 564(b)(1) of the Act, 21 U.S.C.section 360bbb-3(b)(1), unless the authorization is terminated  or revoked sooner.       Influenza A by PCR NEGATIVE NEGATIVE Final   Influenza B by PCR NEGATIVE NEGATIVE Final    Comment: (NOTE) The Xpert Xpress SARS-CoV-2/FLU/RSV plus assay is intended as an aid in the diagnosis of influenza from Nasopharyngeal swab specimens and should not be used as a sole basis for treatment. Nasal washings and aspirates are unacceptable for Xpert Xpress SARS-CoV-2/FLU/RSV testing.  Fact Sheet for Patients: EntrepreneurPulse.com.au  Fact Sheet for Healthcare Providers: IncredibleEmployment.be  This test is not yet approved or cleared by the [place] FDA and has been authorized for detection and/or diagnosis of SARS-CoV-2 by FDA under an Emergency Use Authorization (EUA). This EUA will remain in effect (meaning this test can be used) for the duration of the COVID-19 declaration under Section 564(b)(1) of the Act, 21 U.S.C. section 360bbb-3(b)(1), unless the authorization is terminated or revoked.  Performed at [facility], [address] [place], [place] [address]   Culture, blood (Routine X 2) w Reflex to ID Panel     Status: None   Collection Time: 05/06/21  6:29 AM   Specimen: BLOOD  Result Value Ref Range Status   Specimen Description BLOOD RIGHT ANTECUBITAL  Final   Special Requests   Final    BOTTLES DRAWN AEROBIC AND ANAEROBIC Blood Culture adequate volume   Culture   Final    NO GROWTH 5 DAYS Performed at [facility], [address], [place], [place] [address]    Report Status 05/11/2021 FINAL  Final  Aerobic/Anaerobic Culture w Gram Stain (surgical/deep wound)     Status: None  (Preliminary result)   Collection Time: 05/09/21  3:06 PM   Specimen: Vulva; Abscess  Result Value Ref Range Status   Specimen Description   Final    VULVA Performed at [facility], [address] [place], [place] [address]    Special Requests   Final    NONE Performed at [facility], [address] [place], [place] [address]    Gram Stain   Final    RARE WBC PRESENT,BOTH PMN AND MONONUCLEAR NO ORGANISMS SEEN    Culture   Final    CULTURE REINCUBATED FOR BETTER GROWTH Performed at [facility], [place] [address], [place], [place] [address]    Report Status PENDING  Incomplete    Procedures and diagnostic studies:  No results found.             LOS: 5 days   Levon Penning  [facility]   Pager on [email or web address]. If 7PM-7AM, please contact night-coverage at [email or web address]     05/11/2021, 11:40 AM

## 2021-05-11 NOTE — Progress Notes (Signed)
Pharmacy Antibiotic Note  Jane Schultz is a 41 y.o. female admitted on 05/05/2021 with left-sided vulva and mons cellulitis. Concern for abscess. Pharmacy has been consulted for vancomycin and unasyn dosing.  Plan: Continue vancomycin 1500 mg IV q12h based on level calculations.   Started Unasyn 3 g q6H.     Height: 5\' 7"  (170.2 cm) Weight: 92.5 kg (204 lb) IBW/kg (Calculated) : 61.6  Temp (24hrs), Avg:99.2 F (37.3 C), Min:98 F (36.7 C), Max:100.4 F (38 C)  Recent Labs  Lab 05/06/21 0031 05/06/21 0629 05/07/21 1146 05/08/21 0648 05/09/21 0533 05/09/21 1726 05/10/21 0158 05/11/21 0610  WBC  --  16.9* 15.2* 12.6* 12.9*  --  13.6* 14.8*  CREATININE  --  0.71 0.72 0.81 0.77  --  0.80 0.96  LATICACIDVEN 1.0 1.1  --   --   --   --   --   --   VANCOTROUGH  --   --   --   --   --   --  8*  --   VANCOPEAK  --   --   --   --   --  19*  --   --      Estimated Creatinine Clearance: 91.9 mL/min (by C-G formula based on SCr of 0.96 mg/dL).    No Known Allergies  Antimicrobials this admission: Vancomycin 1/19 >> Ceftriaxone 1/19 >>  Microbiology results: 1/19 BCx: no growth   Thank you for allowing pharmacy to be a part of this patients care.  Oswald Hillock, PharmD, BCPS Clinical Pharmacist 05/11/2021 4:55 PM

## 2021-05-11 NOTE — Progress Notes (Signed)
Specimen stuck in tube system station in SCN. Tube system dow. Specimen walked down to lab via SCC.

## 2021-05-11 NOTE — Progress Notes (Signed)
Jane Schultz is a 41 y.o. hearing impaired female admitted on 05/05/2021 with sepsis secondary to left-sided vulva and mons cellulitis and suspected abscess. Currently receiving IV antibiotics. PMH includes Type II DM, HTN.  Post-Operative Day #2 s/p bedside I&D of vulvar abscess.   ASL interpreter [phone], [name]) used.   Subjective: up ad lib, voiding, and tolerating PO.  Notes that she is not having much pain. Able to ambulate.  Noting a mild odor in vaginal region.   Objective: Temp:  [98 F (36.7 C)-101.2 F (38.4 C)] 98 F (36.7 C) (01/24 1155) Pulse Rate:  [76-91] 76 (01/24 1155) Resp:  [20] 20 (01/24 1155) BP: (133-146)/(60-83) 146/83 (01/24 1155) SpO2:  [96 %-100 %] 100 % (01/24 1155)  Physical Exam:  General: alert and no distress  Lungs: clear to auscultation bilaterally Heart: regular rate and rhythm, S1, S2 normal, no murmur, click, rub or gallop Abdomen: soft, non-tender; bowel sounds normal; no masses,  no organomegaly Pelvis: external genitalia with moderate edema of left labia majora to mons, however not as tender as previous exam. Still small amount of purulent drainage present with massage.     Labs:  CBC Latest Ref Rng & Units 05/11/2021 05/10/2021 05/09/2021  WBC 4.0 - 10.5 K/uL 14.8(H) 13.6(H) 12.9(H)  Hemoglobin 12.0 - 15.0 g/dL 10.9(L) 11.1(L) 11.2(L)  Hematocrit 36.0 - 46.0 % 32.3(L) 33.1(L) 33.4(L)  Platelets 150 - 400 K/uL 242 218 212     Lab Results  Component Value Date   CREATININE 0.96 05/11/2021     Assessment/Plan:  1. Right vulvar cellulitis  with abscess - WBC slowly trending upward again, patient with last fever noted at 4:30 pm yesterday.  Abscess continuing to drain, expressed ~ 30 cc on today's exam.  Continue warm compresses to area 2- 3 x daily. Continue scheduled antibiotics (Ceftriaxone resumed in addition to current treatment with Vancomycin). Wound culture pending however no growth previously noted. Repeat culture  performed again today.  2. Right buttock decubitus bed sore - continue to treat with zinc oxide and pressure bandage. Advised on alternating positions while sitting and encouraged ambulation.  3. HTN and DM to be managed by Hospitalist.     LOS:  5    Rubie Maid, MD [facility]

## 2021-05-12 ENCOUNTER — Inpatient Hospital Stay: Payer: Medicaid Other

## 2021-05-12 LAB — CBC WITH DIFFERENTIAL/PLATELET
Abs Immature Granulocytes: 0.13 10*3/uL — ABNORMAL HIGH (ref 0.00–0.07)
Basophils Absolute: 0.1 10*3/uL (ref 0.0–0.1)
Basophils Relative: 1 %
Eosinophils Absolute: 0.2 10*3/uL (ref 0.0–0.5)
Eosinophils Relative: 2 %
HCT: 34.1 % — ABNORMAL LOW (ref 36.0–46.0)
Hemoglobin: 11.2 g/dL — ABNORMAL LOW (ref 12.0–15.0)
Immature Granulocytes: 1 %
Lymphocytes Relative: 14 %
Lymphs Abs: 2 10*3/uL (ref 0.7–4.0)
MCH: 27.1 pg (ref 26.0–34.0)
MCHC: 32.8 g/dL (ref 30.0–36.0)
MCV: 82.6 fL (ref 80.0–100.0)
Monocytes Absolute: 1.2 10*3/uL — ABNORMAL HIGH (ref 0.1–1.0)
Monocytes Relative: 8 %
Neutro Abs: 10.9 10*3/uL — ABNORMAL HIGH (ref 1.7–7.7)
Neutrophils Relative %: 74 %
Platelets: 281 10*3/uL (ref 150–400)
RBC: 4.13 MIL/uL (ref 3.87–5.11)
RDW: 13.9 % (ref 11.5–15.5)
Smear Review: NORMAL
WBC: 14.5 10*3/uL — ABNORMAL HIGH (ref 4.0–10.5)
nRBC: 0 % (ref 0.0–0.2)

## 2021-05-12 LAB — GLUCOSE, CAPILLARY
Glucose-Capillary: 185 mg/dL — ABNORMAL HIGH (ref 70–99)
Glucose-Capillary: 171 mg/dL — ABNORMAL HIGH (ref 70–99)
Glucose-Capillary: 114 mg/dL — ABNORMAL HIGH (ref 70–99)
Glucose-Capillary: 133 mg/dL — ABNORMAL HIGH (ref 70–99)

## 2021-05-12 LAB — AEROBIC/ANAEROBIC CULTURE W GRAM STAIN (SURGICAL/DEEP WOUND)

## 2021-05-12 LAB — CREATININE, SERUM
Creatinine, Ser: 0.87 mg/dL (ref 0.44–1.00)
GFR, Estimated: >60 mL/min (ref >60)

## 2021-05-12 MED ORDER — LISINOPRIL 20 MG PO TABS
20.0000 mg | ORAL_TABLET | Freq: Every day | ORAL | Status: DC
  Administered 2021-05-13 – 2021-05-14 (×2): 20 mg via ORAL
  Filled 2021-05-12 (×2): qty 1

## 2021-05-12 NOTE — Progress Notes (Addendum)
Progress Note    Jane Schultz  XKG:[id number] DOB: [birth year]  DOA: 05/05/2021 PCP: Patient, No Pcp Per (Inactive)      Brief Narrative:    Medical records reviewed and are as summarized below:  Jane Schultz is a 41 y.o. female with medical history significant for type 2 diabetes mellitus, hypertension, asthma, deafness, who presented to the hospital because of pain in the left labia.  She was febrile and tachycardic in the emergency room with white cell count of 18,000.  She was admitted to the hospital for sepsis secondary to vulva/mons cellulitis.  She was treated with IV fluids, analgesics and empiric IV antibiotics.    ,      Assessment/Plan:   Principal Problem:   Cellulitis of labia majora Active Problems:   HTN (hypertension)   Deaf   Sepsis ([place])   Hyperglycemia due to type 2 diabetes mellitus ([place])   Pressure injury of skin   Body mass index is 31.95 kg/m.  (Morbid obesity)  Sepsis secondary to left-sided vulva/labia and mons pubis cellulitis with abscess, fever, leukocytosis : S/p I&D of left labia abscess on 05/09/2021.  Wound culture showed rare staph lugdunensis, rare strep group F, few Prevotella bivia.  Ceftriaxone was switched to IV Unasyn.  Continue IV vancomycin.  Defer decision for repeat I&D to gynecologist.  Continue analgesics.  Insulin-dependent type 2 DM with hyperglycemia: Continue insulin glargine and NovoLog as needed for hyperglycemia   Constipation: Continue laxatives as needed.  Stage II right buttock decubitus ulcer: Continue local wound care with zinc oxide  Nausea, vomiting, hyponatremia and hypokalemia: Improved: Monitor BMP  Sign language interpretation service via iPad was used for this encounter  Diet Order             Diet Carb Modified Fluid consistency: Thin; Room service appropriate? Yes  Diet effective now                      Consultants: Gynecologist  Procedures: I&D of  left labial abscess on 05/10/2021    Medications:    atorvastatin  20 mg Oral Daily   bisacodyl  10 mg Oral Once   enoxaparin (LOVENOX) injection  0.5 mg/kg Subcutaneous Q24H   influenza vac split quadrivalent PF  0.5 mL Intramuscular Tomorrow-1000   insulin aspart  0-20 Units Subcutaneous TID WC   insulin aspart  0-5 Units Subcutaneous QHS   insulin aspart  4 Units Subcutaneous TID WC   [START ON 05/13/2021] lisinopril  20 mg Oral Daily   metoprolol tartrate  12.5 mg Oral BID   polyethylene glycol  17 g Oral Daily   sodium chloride flush  10 mL Intravenous Q12H   sodium chloride flush  10-40 mL Intracatheter Q12H   zinc oxide   Topical BID   Continuous Infusions:  sodium chloride 10 mL/hr at 05/12/21 0327   ampicillin-sulbactam (UNASYN) IV 3 g (05/12/21 1240)   promethazine (PHENERGAN) injection (IM or IVPB)     vancomycin Stopped (05/12/21 0540)     Anti-infectives (From admission, onward)    Start     Dose/Rate Route Frequency Ordered Stop   05/11/21 1800  Ampicillin-Sulbactam (UNASYN) 3 g in sodium chloride 0.9 % 100 mL IVPB        3 g 200 mL/hr over 30 Minutes Intravenous Every 6 hours 05/11/21 1655     05/10/21 1500  vancomycin (VANCOREADY) IVPB 1500 mg/300 mL        1,500 mg 150  mL/hr over 120 Minutes Intravenous Every 12 hours 05/10/21 0327     05/10/21 0415  vancomycin (VANCOREADY) IVPB 500 mg/100 mL        500 mg 100 mL/hr over 60 Minutes Intravenous  Once 05/10/21 0324 05/10/21 0517   05/07/21 0100  cefTRIAXone (ROCEPHIN) 2 g in sodium chloride 0.9 % 100 mL IVPB  Status:  Discontinued        2 g 200 mL/hr over 30 Minutes Intravenous Every 24 hours 05/06/21 0245 05/11/21 1653   05/06/21 1500  vancomycin (VANCOCIN) IVPB 1000 mg/200 mL premix  Status:  Discontinued        1,000 mg 200 mL/hr over 60 Minutes Intravenous Every 12 hours 05/06/21 0344 05/10/21 1112   05/06/21 0345  vancomycin (VANCOREADY) IVPB 1250 mg/250 mL        1,250 mg 166.7 mL/hr over 90 Minutes  Intravenous  Once 05/06/21 0339 05/06/21 0659   05/06/21 0230  vancomycin (VANCOCIN) IVPB 1000 mg/200 mL premix        1,000 mg 200 mL/hr over 60 Minutes Intravenous  Once 05/06/21 0216 05/06/21 0332   05/06/21 0030  cefTRIAXone (ROCEPHIN) 2 g in sodium chloride 0.9 % 100 mL IVPB        2 g 200 mL/hr over 30 Minutes Intravenous  Once 05/06/21 0023 05/06/21 0122              Family Communication/Anticipated D/C date and plan/Code Status   DVT prophylaxis:      Code Status: Full Code  Family Communication: None Disposition Plan: Plan to discharge home in 1 to 2 days   Status is: Inpatient  Remains inpatient appropriate because: IV antibiotics           Subjective:   Interval events noted.  She complains of pain in the left mons pubis.  She has noticed that the swelling in the left mons pubis is spreading towards the left side of the pubis and down to the thigh area.  She thinks she is not getting better.  She is frustrated about the lack of improvement.   Objective:    Vitals:   05/12/21 0130 05/12/21 0316 05/12/21 0821 05/12/21 1105  BP:  (!) 154/71 (!) 164/79 (!) 144/72  Pulse:  80 76 76  Resp:  18 20 17   Temp: 99.5 F (37.5 C) 99.1 F (37.3 C) 98.8 F (37.1 C) 98.9 F (37.2 C)  TempSrc: Oral   Oral  SpO2:  99% 99%   Weight:      Height:       No data found.   Intake/Output Summary (Last 24 hours) at 05/12/2021 1427 Last data filed at 05/12/2021 1418 Gross per 24 hour  Intake 680 ml  Output --  Net 680 ml   Filed Weights   05/06/21 0256  Weight: 92.5 kg    Exam:   GEN: NAD SKIN: No rash EYES: EOMI ENT: MMM CV: RRR PULM: CTA B ABD: soft, ND, NT, +BS CNS: AAO x 3, non focal EXT: No edema or tenderness GU: Swelling, induration and tenderness of the mons pubis and left labia.  Seems to be more swollen.  No drainage noted    Madelyn, RN was at the bedside as a chaperone      Data Reviewed:   I have personally reviewed  following labs and imaging studies:  Labs: Labs show the following:   Basic Metabolic Panel: Recent Labs  Lab 05/07/21 1146 05/08/21 0648 05/09/21 0533 05/10/21 0158 05/11/21  6433 05/12/21 0611  NA 132* 133* 135 131* 135  --   K 3.1* 3.8 3.4* 3.6 3.9  --   CL 101 99 102 102 101  --   CO2 25 27 24 24 25   --   GLUCOSE 158* 230* 183* 201* 155*  --   BUN 11 14 14 13 12   --   CREATININE 0.72 0.81 0.77 0.80 0.96 0.87  CALCIUM 8.0* 8.2* 8.2* 7.9* 8.2*  --   MG 1.7 1.9 1.9  --   --   --   PHOS  --  2.9 2.8  --   --   --    GFR Estimated Creatinine Clearance: 101.4 mL/min (by C-G formula based on SCr of 0.87 mg/dL). Liver Function Tests: Recent Labs  Lab 05/05/21 2025  AST 36  ALT 30  ALKPHOS 79  BILITOT 0.8  PROT 7.3  ALBUMIN 3.5   No results for input(s): LIPASE, AMYLASE in the last 168 hours. No results for input(s): AMMONIA in the last 168 hours. Coagulation profile Recent Labs  Lab 05/06/21 0629  INR 1.2    CBC: Recent Labs  Lab 05/08/21 0648 05/09/21 0533 05/10/21 0158 05/11/21 0610 05/12/21 0611  WBC 12.6* 12.9* 13.6* 14.8* 14.5*  NEUTROABS 10.5* 10.6* 11.0* 11.4* 10.9*  HGB 11.8* 11.2* 11.1* 10.9* 11.2*  HCT 35.9* 33.4* 33.1* 32.3* 34.1*  MCV 83.1 81.1 81.1 81.2 82.6  PLT 184 212 218 242 281   Cardiac Enzymes: No results for input(s): CKTOTAL, CKMB, CKMBINDEX, TROPONINI in the last 168 hours. BNP (last 3 results) No results for input(s): PROBNP in the last 8760 hours. CBG: Recent Labs  Lab 05/11/21 1158 05/11/21 1746 05/11/21 2215 05/12/21 0829 05/12/21 1224  GLUCAP 182* 133* 154* 185* 171*   D-Dimer: No results for input(s): DDIMER in the last 72 hours. Hgb A1c: No results for input(s): HGBA1C in the last 72 hours.  Lipid Profile: No results for input(s): CHOL, HDL, LDLCALC, TRIG, CHOLHDL, LDLDIRECT in the last 72 hours. Thyroid function studies: No results for input(s): TSH, T4TOTAL, T3FREE, THYROIDAB in the last 72  hours.  Invalid input(s): FREET3 Anemia work up: No results for input(s): VITAMINB12, FOLATE, FERRITIN, TIBC, IRON, RETICCTPCT in the last 72 hours. Sepsis Labs: Recent Labs  Lab 05/06/21 0031 05/06/21 0629 05/07/21 1146 05/09/21 0533 05/10/21 0158 05/11/21 0610 05/12/21 0611  PROCALCITON  --  0.24  --   --   --   --   --   WBC  --  16.9*   < > 12.9* 13.6* 14.8* 14.5*  LATICACIDVEN 1.0 1.1  --   --   --   --   --    < > = values in this interval not displayed.    Microbiology Recent Results (from the past 240 hour(s))  Blood culture (routine x 2)     Status: None   Collection Time: 05/06/21 12:31 AM   Specimen: BLOOD  Result Value Ref Range Status   Specimen Description BLOOD LEFT ASSIST CONTROL  Final   Special Requests   Final    BOTTLES DRAWN AEROBIC AND ANAEROBIC Blood Culture adequate volume   Culture   Final    NO GROWTH 5 DAYS Performed at [facility], [address] [place], [place] [address]    Report Status 05/11/2021 FINAL  Final  Resp Panel by RT-PCR (Flu A&B, Covid) Nasopharyngeal Swab     Status: None   Collection Time: 05/06/21 12:31 AM   Specimen: Nasopharyngeal Swab;  Nasopharyngeal(NP) swabs in vial transport medium  Result Value Ref Range Status   SARS Coronavirus 2 by RT PCR NEGATIVE NEGATIVE Final    Comment: (NOTE) SARS-CoV-2 target nucleic acids are NOT DETECTED.  The SARS-CoV-2 RNA is generally detectable in upper respiratory specimens during the acute phase of infection. The lowest concentration of SARS-CoV-2 viral copies this assay can detect is 138 copies/mL. A negative result does not preclude SARS-Cov-2 infection and should not be used as the sole basis for treatment or other patient management decisions. A negative result may occur with  improper specimen collection/handling, submission of specimen other than nasopharyngeal swab, presence of viral mutation(s) within the areas targeted by this assay, and inadequate number of  viral copies(<138 copies/mL). A negative result must be combined with clinical observations, patient history, and epidemiological information. The expected result is Negative.  Fact Sheet for Patients:  EntrepreneurPulse.com.au  Fact Sheet for Healthcare Providers:  IncredibleEmployment.be  This test is no t yet approved or cleared by the [place] FDA and  has been authorized for detection and/or diagnosis of SARS-CoV-2 by FDA under an Emergency Use Authorization (EUA). This EUA will remain  in effect (meaning this test can be used) for the duration of the COVID-19 declaration under Section 564(b)(1) of the Act, 21 U.S.C.section 360bbb-3(b)(1), unless the authorization is terminated  or revoked sooner.       Influenza A by PCR NEGATIVE NEGATIVE Final   Influenza B by PCR NEGATIVE NEGATIVE Final    Comment: (NOTE) The Xpert Xpress SARS-CoV-2/FLU/RSV plus assay is intended as an aid in the diagnosis of influenza from Nasopharyngeal swab specimens and should not be used as a sole basis for treatment. Nasal washings and aspirates are unacceptable for Xpert Xpress SARS-CoV-2/FLU/RSV testing.  Fact Sheet for Patients: EntrepreneurPulse.com.au  Fact Sheet for Healthcare Providers: IncredibleEmployment.be  This test is not yet approved or cleared by the [place] FDA and has been authorized for detection and/or diagnosis of SARS-CoV-2 by FDA under an Emergency Use Authorization (EUA). This EUA will remain in effect (meaning this test can be used) for the duration of the COVID-19 declaration under Section 564(b)(1) of the Act, 21 U.S.C. section 360bbb-3(b)(1), unless the authorization is terminated or revoked.  Performed at [facility], [address] [place], [place] [address]   Culture, blood (Routine X 2) w Reflex to ID Panel     Status: None   Collection Time: 05/06/21  6:29 AM    Specimen: BLOOD  Result Value Ref Range Status   Specimen Description BLOOD RIGHT ANTECUBITAL  Final   Special Requests   Final    BOTTLES DRAWN AEROBIC AND ANAEROBIC Blood Culture adequate volume   Culture   Final    NO GROWTH 5 DAYS Performed at [facility], [address] [place], [place] [address]    Report Status 05/11/2021 FINAL  Final  Aerobic/Anaerobic Culture w Gram Stain (surgical/deep wound)     Status: None   Collection Time: 05/09/21  3:06 PM   Specimen: Vulva; Abscess  Result Value Ref Range Status   Specimen Description   Final    VULVA Performed at [facility], [address] [place], [place] [address]    Special Requests   Final    NONE Performed at [facility], [address] [place], [place] [address]    Gram Stain   Final    RARE WBC PRESENT,BOTH PMN AND MONONUCLEAR NO ORGANISMS SEEN    Culture  Final    RARE STAPHYLOCOCCUS LUGDUNENSIS RARE STREPTOCOCCUS GROUP F Beta hemolytic streptococci are predictably susceptible to penicillin and other beta lactams. Susceptibility testing not routinely performed. FEW PREVOTELLA BIVIA BETA LACTAMASE POSITIVE Performed at [facility], [place] [address], [place], [place] [address]    Report Status 05/12/2021 FINAL  Final   Organism ID, Bacteria STAPHYLOCOCCUS LUGDUNENSIS  Final      Susceptibility   Staphylococcus lugdunensis - MIC*    CIPROFLOXACIN <=0.5 SENSITIVE Sensitive     ERYTHROMYCIN <=0.25 SENSITIVE Sensitive     GENTAMICIN <=0.5 SENSITIVE Sensitive     OXACILLIN >=4 RESISTANT Resistant     TETRACYCLINE <=1 SENSITIVE Sensitive     VANCOMYCIN <=0.5 SENSITIVE Sensitive     TRIMETH/SULFA <=10 SENSITIVE Sensitive     CLINDAMYCIN <=0.25 SENSITIVE Sensitive     RIFAMPIN <=0.5 SENSITIVE Sensitive     Inducible Clindamycin NEGATIVE Sensitive     * RARE STAPHYLOCOCCUS LUGDUNENSIS  Aerobic/Anaerobic Culture w Gram Stain (surgical/deep wound)     Status: None (Preliminary  result)   Collection Time: 05/11/21  1:03 PM   Specimen: Vulva; Abscess  Result Value Ref Range Status   Specimen Description   Final    VULVA Performed at [facility], [address] [place], [place] [address]    Special Requests   Final    NONE Performed at [facility], [place], [place] [address]    Gram Stain   Final    FEW WBC PRESENT, PREDOMINANTLY MONONUCLEAR FEW GRAM NEGATIVE RODS FEW GRAM POSITIVE COCCI IN PAIRS    Culture   Final    TOO YOUNG TO READ Performed at [facility], [place] [address] [place] [address] [place], [place] [address]    Report Status PENDING  Incomplete    Procedures and diagnostic studies:  No results found.             LOS: 6 days   Millenia Waldvogel  [facility]   Pager on [email or web address]. If 7PM-7AM, please contact night-coverage at [email or web address]     05/12/2021, 2:27 PM

## 2021-05-12 NOTE — Progress Notes (Signed)
Jane Schultz is a 41 y.o. hearing impaired female admitted on 05/05/2021 with sepsis secondary to left-sided vulva and mons cellulitis and suspected abscess. Currently receiving IV antibiotics. PMH includes Type II DM, HTN.  Post-Operative Day #3 s/p bedside I&D of vulvar abscess.   ASL interpreter [phone], [name]) used.   Subjective: up ad lib, voiding, and tolerating PO.  States that her vaginal region is feeling more swollen again, feels like it is turning red and spreading to her leg. Also expressing that she wants to get this resolved as she desires to go home to her family.   Objective: Temp:  [98.8 F (37.1 C)-99.7 F (37.6 C)] 98.9 F (37.2 C) (01/25 1105) Pulse Rate:  [75-82] 76 (01/25 1105) Resp:  [17-22] 17 (01/25 1105) BP: (140-164)/(66-79) 144/72 (01/25 1105) SpO2:  [96 %-100 %] 99 % (01/25 0821)  Physical Exam:  General: alert and no distress  Lungs: clear to auscultation bilaterally Heart: regular rate and rhythm, S1, S2 normal, no murmur, click, rub or gallop Abdomen: soft, non-tender; bowel sounds normal; no masses,  no organomegaly Pelvis: external genitalia with moderate edema vs loculated abscess collection at mons pubis of left labia.  Labia minora fluctuant, after removal of debris from previous incision site, expressed ~ 50-75 ml of purulent fluid.    Labs:  CBC Latest Ref Rng & Units 05/12/2021 05/11/2021 05/10/2021  WBC 4.0 - 10.5 K/uL 14.5(H) 14.8(H) 13.6(H)  Hemoglobin 12.0 - 15.0 g/dL 11.2(L) 10.9(L) 11.1(L)  Hematocrit 36.0 - 46.0 % 34.1(L) 32.3(L) 33.1(L)  Platelets 150 - 400 K/uL 281 242 218     Lab Results  Component Value Date   CREATININE 0.87 05/12/2021    Results for orders placed or performed during the hospital encounter of 05/05/21  Aerobic/Anaerobic Culture w Gram Stain (surgical/deep wound)   Specimen: Vulva; Abscess  Result Value Ref Range   Specimen Description      VULVA Performed at [facility], [address] [place], [place] [address]    Special Requests      NONE Performed at [facility], [place], [place] [address]    Gram Stain      RARE WBC PRESENT,BOTH PMN AND MONONUCLEAR NO ORGANISMS SEEN    Culture      RARE STAPHYLOCOCCUS LUGDUNENSIS RARE STREPTOCOCCUS GROUP F Beta hemolytic streptococci are predictably susceptible to penicillin and other beta lactams. Susceptibility testing not routinely performed. FEW PREVOTELLA BIVIA BETA LACTAMASE POSITIVE Performed at [facility], [place] [address], [place], [place] [address]    Report Status 05/12/2021 FINAL    Organism ID, Bacteria STAPHYLOCOCCUS LUGDUNENSIS       Susceptibility   Staphylococcus lugdunensis - MIC*    CIPROFLOXACIN <=0.5 SENSITIVE Sensitive     ERYTHROMYCIN <=0.25 SENSITIVE Sensitive     GENTAMICIN <=0.5 SENSITIVE Sensitive     OXACILLIN >=4 RESISTANT Resistant     TETRACYCLINE <=1 SENSITIVE Sensitive     VANCOMYCIN <=0.5 SENSITIVE Sensitive     TRIMETH/SULFA <=10 SENSITIVE Sensitive     CLINDAMYCIN <=0.25 SENSITIVE Sensitive     RIFAMPIN <=0.5 SENSITIVE Sensitive     Inducible Clindamycin NEGATIVE Sensitive     * RARE STAPHYLOCOCCUS LUGDUNENSIS  Aerobic/Anaerobic Culture w Gram Stain (surgical/deep wound)   Specimen: Vulva; Abscess  Result Value Ref Range   Specimen Description      VULVA Performed at [facility], [address] [place], [place] [address]    Special Requests  NONE Performed at [facility], [place], [place] [address]    Gram Stain      FEW WBC PRESENT, PREDOMINANTLY MONONUCLEAR FEW GRAM NEGATIVE RODS FEW GRAM POSITIVE COCCI IN PAIRS    Culture      TOO YOUNG TO READ Performed at [facility], [place] [address] [place], [place] [address]    Report Status PENDING     Assessment/Plan:  1. Right vulvar cellulitis  with abscess - WBC has reached a plateau, also has been afebrile for more than 24 hours.    Abscess continuing to drain (although initially clogged with debris), expressed ~ 50-75 cc on today's exam.  Continue warm compresses to area 2- 3 x daily. Continue scheduled antibiotics (Ceftriaxone with Vancomycin) until cultures resulted. Pending repeat wound culture results. Discussed likely need for further exploration of area as there is still a loculated area within the mons pubis.  May need to consider I&D in the OR and possible drain placement.   2. Right buttock decubitus bed sore - continue to treat with zinc oxide and pressure bandage.  3. HTN and DM to be managed by Hospitalist.     LOS:  6    Rubie Maid, MD [facility]

## 2021-05-13 ENCOUNTER — Inpatient Hospital Stay: Payer: Medicaid Other | Admitting: Certified Registered Nurse Anesthetist

## 2021-05-13 ENCOUNTER — Other Ambulatory Visit: Payer: Self-pay

## 2021-05-13 ENCOUNTER — Encounter
Admission: EM | Disposition: A | Discharge status: Home / Self Care | Payer: Self-pay | Source: Home / Self Care | Attending: Internal Medicine

## 2021-05-13 HISTORY — PX: EXCISION VAGINAL CYST: SHX5825

## 2021-05-13 LAB — CREATININE, SERUM
Creatinine, Ser: 0.67 mg/dL (ref 0.44–1.00)
GFR, Estimated: >60 mL/min (ref >60)

## 2021-05-13 LAB — CBC WITH DIFFERENTIAL/PLATELET
Abs Immature Granulocytes: 0.1 10*3/uL — ABNORMAL HIGH (ref 0.00–0.07)
Basophils Absolute: 0.1 10*3/uL (ref 0.0–0.1)
Basophils Relative: 1 %
Eosinophils Absolute: 0.3 10*3/uL (ref 0.0–0.5)
Eosinophils Relative: 2 %
HCT: 32 % — ABNORMAL LOW (ref 36.0–46.0)
Hemoglobin: 10.6 g/dL — ABNORMAL LOW (ref 12.0–15.0)
Immature Granulocytes: 1 %
Lymphocytes Relative: 24 %
Lymphs Abs: 2.6 10*3/uL (ref 0.7–4.0)
MCH: 27.2 pg (ref 26.0–34.0)
MCHC: 33.1 g/dL (ref 30.0–36.0)
MCV: 82.1 fL (ref 80.0–100.0)
Monocytes Absolute: 0.8 10*3/uL (ref 0.1–1.0)
Monocytes Relative: 7 %
Neutro Abs: 7.1 10*3/uL (ref 1.7–7.7)
Neutrophils Relative %: 65 %
Platelets: 283 10*3/uL (ref 150–400)
RBC: 3.9 MIL/uL (ref 3.87–5.11)
RDW: 14.3 % (ref 11.5–15.5)
Smear Review: NORMAL
WBC Morphology: INCREASED
WBC: 10.9 10*3/uL — ABNORMAL HIGH (ref 4.0–10.5)
nRBC: 0 % (ref 0.0–0.2)

## 2021-05-13 LAB — GLUCOSE, CAPILLARY
Glucose-Capillary: 114 mg/dL — ABNORMAL HIGH (ref 70–99)
Glucose-Capillary: 157 mg/dL — ABNORMAL HIGH (ref 70–99)
Glucose-Capillary: 181 mg/dL — ABNORMAL HIGH (ref 70–99)

## 2021-05-13 SURGERY — EXCISION, CYST, VAGINA
Anesthesia: General

## 2021-05-13 MED ORDER — PROPOFOL 10 MG/ML IV BOLUS
INTRAVENOUS | Status: AC
  Filled 2021-05-13: qty 20

## 2021-05-13 MED ORDER — PROPOFOL 10 MG/ML IV BOLUS
INTRAVENOUS | Status: DC | PRN
  Administered 2021-05-13: 30 mg via INTRAVENOUS
  Administered 2021-05-13: 200 mg via INTRAVENOUS
  Administered 2021-05-13: 50 mg via INTRAVENOUS

## 2021-05-13 MED ORDER — FENTANYL CITRATE (PF) 100 MCG/2ML IJ SOLN
INTRAMUSCULAR | Status: AC
  Filled 2021-05-13: qty 2

## 2021-05-13 MED ORDER — LIDOCAINE HCL 1 % IJ SOLN
INTRAMUSCULAR | Status: DC | PRN
  Administered 2021-05-13: 20 mL

## 2021-05-13 MED ORDER — ACETAMINOPHEN 500 MG PO TABS: 1000.0000 mg | ORAL_TABLET | ORAL | Status: AC

## 2021-05-13 MED ORDER — METOPROLOL TARTRATE 50 MG PO TABS
25.0000 mg | ORAL_TABLET | Freq: Two times a day (BID) | ORAL | Status: DC
  Administered 2021-05-13 – 2021-05-14 (×2): 25 mg via ORAL
  Filled 2021-05-13 (×3): qty 0.5

## 2021-05-13 MED ORDER — LIDOCAINE HCL URETHRAL/MUCOSAL 2 % EX GEL
1.0000 "application " | Freq: Three times a day (TID) | CUTANEOUS | Status: DC | PRN
  Filled 2021-05-13: qty 5

## 2021-05-13 MED ORDER — 0.9 % SODIUM CHLORIDE (POUR BTL) OPTIME
TOPICAL | Status: DC | PRN
  Administered 2021-05-13: 500 mL

## 2021-05-13 MED ORDER — SUGAMMADEX SODIUM 200 MG/2ML IV SOLN
INTRAVENOUS | Status: DC | PRN
  Administered 2021-05-13: 200 mg via INTRAVENOUS

## 2021-05-13 MED ORDER — FENTANYL CITRATE (PF) 100 MCG/2ML IJ SOLN
25.0000 ug | INTRAMUSCULAR | Status: DC | PRN
  Administered 2021-05-13: 25 ug via INTRAVENOUS

## 2021-05-13 MED ORDER — MIDAZOLAM HCL 2 MG/2ML IJ SOLN
INTRAMUSCULAR | Status: DC | PRN
  Administered 2021-05-13: 2 mg via INTRAVENOUS

## 2021-05-13 MED ORDER — LIDOCAINE HCL (CARDIAC) PF 100 MG/5ML IV SOSY
PREFILLED_SYRINGE | INTRAVENOUS | Status: DC | PRN
  Administered 2021-05-13: 100 mg via INTRAVENOUS

## 2021-05-13 MED ORDER — OXYCODONE HCL 5 MG/5ML PO SOLN: 5.0000 mg | Freq: Once | ORAL | Status: AC | PRN

## 2021-05-13 MED ORDER — LACTATED RINGERS IV SOLN: INTRAVENOUS | Status: DC

## 2021-05-13 MED ORDER — LIDOCAINE HCL (PF) 1 % IJ SOLN
INTRAMUSCULAR | Status: AC
  Filled 2021-05-13: qty 30

## 2021-05-13 MED ORDER — ACETAMINOPHEN 500 MG PO TABS
ORAL_TABLET | ORAL | Status: AC
  Administered 2021-05-13: 1000 mg via ORAL
  Filled 2021-05-13: qty 2

## 2021-05-13 MED ORDER — DEXAMETHASONE SODIUM PHOSPHATE 10 MG/ML IJ SOLN
INTRAMUSCULAR | Status: DC | PRN
  Administered 2021-05-13: 10 mg via INTRAVENOUS

## 2021-05-13 MED ORDER — FENTANYL CITRATE (PF) 100 MCG/2ML IJ SOLN
INTRAMUSCULAR | Status: DC | PRN
  Administered 2021-05-13 (×2): 50 ug via INTRAVENOUS

## 2021-05-13 MED ORDER — VANCOMYCIN HCL 1000 MG IV SOLR
INTRAVENOUS | Status: DC | PRN
  Administered 2021-05-13: 1500 mg via INTRAVENOUS

## 2021-05-13 MED ORDER — METFORMIN HCL 500 MG PO TABS
500.0000 mg | ORAL_TABLET | Freq: Two times a day (BID) | ORAL | Status: DC
  Administered 2021-05-13 – 2021-05-14 (×2): 500 mg via ORAL
  Filled 2021-05-13 (×2): qty 1

## 2021-05-13 MED ORDER — OXYCODONE HCL 5 MG PO TABS
5.0000 mg | ORAL_TABLET | Freq: Once | ORAL | Status: AC | PRN
  Administered 2021-05-13: 5 mg via ORAL

## 2021-05-13 MED ORDER — OXYCODONE HCL 5 MG PO TABS
ORAL_TABLET | ORAL | Status: AC
  Filled 2021-05-13: qty 1

## 2021-05-13 MED ORDER — SODIUM CHLORIDE 0.9 % IV SOLN: INTRAVENOUS | Status: DC | PRN

## 2021-05-13 MED ORDER — MIDAZOLAM HCL 2 MG/2ML IJ SOLN
INTRAMUSCULAR | Status: AC
  Filled 2021-05-13: qty 2

## 2021-05-13 MED ORDER — ROCURONIUM BROMIDE 100 MG/10ML IV SOLN
INTRAVENOUS | Status: DC | PRN
  Administered 2021-05-13: 70 mg via INTRAVENOUS

## 2021-05-13 MED ORDER — ONDANSETRON HCL 4 MG/2ML IJ SOLN
INTRAMUSCULAR | Status: DC | PRN
Start: 2021-05-13 — End: 2021-05-13
  Administered 2021-05-13: 4 mg via INTRAVENOUS

## 2021-05-13 SURGICAL SUPPLY — 28 items
BLADE SURG 11 STRL SS (BLADE) ×1 IMPLANT
BULB RESERV EVAC DRAIN JP 100C (MISCELLANEOUS) IMPLANT
CATH ROBINSON RED A/P 16FR (CATHETERS) ×2 IMPLANT
DERMABOND ADVANCED (GAUZE/BANDAGES/DRESSINGS) ×1
DERMABOND ADVANCED .7 DNX12 (GAUZE/BANDAGES/DRESSINGS) IMPLANT
DRAIN PENROSE 1/4X12 LTX STRL (WOUND CARE) ×1 IMPLANT
DRAPE LEGGINS SURG 28X43 STRL (DRAPES) ×2 IMPLANT
ELECT REM PT RETURN 9FT ADLT (ELECTROSURGICAL) ×2
ELECTRODE REM PT RTRN 9FT ADLT (ELECTROSURGICAL) ×1 IMPLANT
GAUZE 4X4 16PLY ~~LOC~~+RFID DBL (SPONGE) ×2 IMPLANT
GLOVE SURG ENC MOIS LTX SZ6.5 (GLOVE) ×2 IMPLANT
GLOVE SURG UNDER LTX SZ7 (GLOVE) ×2 IMPLANT
GOWN STRL REUS W/ TWL LRG LVL3 (GOWN DISPOSABLE) ×2 IMPLANT
GOWN STRL REUS W/ TWL XL LVL3 (GOWN DISPOSABLE) ×1 IMPLANT
GOWN STRL REUS W/TWL LRG LVL3 (GOWN DISPOSABLE) ×2
GOWN STRL REUS W/TWL XL LVL3 (GOWN DISPOSABLE)
HANDLE YANKAUER SUCT BULB TIP (MISCELLANEOUS) ×1 IMPLANT
KIT TURNOVER CYSTO (KITS) ×2 IMPLANT
MANIFOLD NEPTUNE II (INSTRUMENTS) ×2 IMPLANT
PACK BASIN MINOR ARMC (MISCELLANEOUS) ×2 IMPLANT
PAD PREP 24X41 OB/GYN DISP (PERSONAL CARE ITEMS) ×2 IMPLANT
SCRUB EXIDINE 4% CHG 4OZ (MISCELLANEOUS) ×2 IMPLANT
SUT CHROMIC 2 0 CT 1 (SUTURE) ×2 IMPLANT
SUT SILK 2 0 SH (SUTURE) ×1 IMPLANT
SUT VIC AB 4-0 PS2 18 (SUTURE) ×1 IMPLANT
SYR 5ML LL (SYRINGE) ×2 IMPLANT
SYR BULB IRRIG 60ML STRL (SYRINGE) ×1 IMPLANT
WATER STERILE IRR 500ML POUR (IV SOLUTION) ×1 IMPLANT

## 2021-05-13 NOTE — Anesthesia Preprocedure Evaluation (Signed)
Anesthesia Evaluation  Patient identified by MRN, date of birth, ID band Patient awake    Reviewed: Allergy & Precautions, NPO status , Patient's Chart, lab work & pertinent test results  History of Anesthesia Complications Negative for: history of anesthetic complications  Airway Mallampati: III  TM Distance: >3 FB Neck ROM: full    Dental  (+) Chipped   Pulmonary neg shortness of breath, asthma ,    Pulmonary exam normal        Cardiovascular Exercise Tolerance: Good hypertension, (-) angina(-) Past MI Normal cardiovascular exam     Neuro/Psych negative neurological ROS  negative psych ROS   GI/Hepatic negative GI ROS, Neg liver ROS, neg GERD  ,  Endo/Other  diabetes, Type 2  Renal/GU      Musculoskeletal   Abdominal   Peds  Hematology negative hematology ROS (+)   Anesthesia Other Findings Past Medical History: No date: Asthma No date: Deaf No date: Diabetes mellitus without complication (HCC) No date: Hypertension  Past Surgical History: No date: CESAREAN SECTION  BMI    Body Mass Index: 31.95 kg/m      Reproductive/Obstetrics negative OB ROS                             Anesthesia Physical Anesthesia Plan  ASA: 3  Anesthesia Plan: General ETT   Post-op Pain Management:    Induction: Intravenous  PONV Risk Score and Plan: Ondansetron, Dexamethasone, Midazolam and Treatment may vary due to age or medical condition  Airway Management Planned: Oral ETT  Additional Equipment:   Intra-op Plan:   Post-operative Plan: Extubation in OR  Informed Consent: I have reviewed the patients History and Physical, chart, labs and discussed the procedure including the risks, benefits and alternatives for the proposed anesthesia with the patient or authorized representative who has indicated his/her understanding and acceptance.     Dental Advisory Given and Interpreter used  for interveiw  Plan Discussed with: Anesthesiologist, CRNA and Surgeon  Anesthesia Plan Comments: (Patient consented for risks of anesthesia including but not limited to:  - adverse reactions to medications - damage to eyes, teeth, lips or other oral mucosa - nerve damage due to positioning  - sore throat or hoarseness - Damage to heart, brain, nerves, lungs, other parts of body or loss of life  Patient voiced understanding.)        Anesthesia Quick Evaluation

## 2021-05-13 NOTE — Anesthesia Procedure Notes (Signed)
Procedure Name: Intubation Date/Time: 05/13/2021 5:52 PM Performed by: Nelda Marseille, CRNA Pre-anesthesia Checklist: Patient identified, Patient being monitored, Timeout performed, Emergency Drugs available and Suction available Patient Re-evaluated:Patient Re-evaluated prior to induction Oxygen Delivery Method: Circle system utilized Preoxygenation: Pre-oxygenation with 100% oxygen Induction Type: IV induction Ventilation: Mask ventilation without difficulty Laryngoscope Size: Mac, 3 and McGraph Grade View: Grade I Tube type: Oral Tube size: 7.0 mm Number of attempts: 1 Airway Equipment and Method: Stylet Placement Confirmation: ETT inserted through vocal cords under direct vision, positive ETCO2 and breath sounds checked- equal and bilateral Secured at: 21 cm Tube secured with: Tape Dental Injury: Teeth and Oropharynx as per pre-operative assessment

## 2021-05-13 NOTE — Progress Notes (Signed)
Patient called out. NA to room, patient needed help going to the bathroom. She complained of severe pain around abscess site and left leg. She requested to use BSC. NA informed this RN. I went to patients room with pain medicine. Using interpreter services patient explained pain characteristics and rating of 14/10. She then expressed that she "wants to do surgery, that she can't do this anymore, and she is fed up". Patient was tearful during conversation. Explained to patient I had pain medicine for her and that I would pass a long her requests/concerns. Also, explained to patient the need to express her requests/concerns to the doctor in the morning. Asked patient if there was anything else I could do to help her feel better. Brought her ice water and told her I would continue to monitor.

## 2021-05-13 NOTE — Transfer of Care (Signed)
Immediate Anesthesia Transfer of Care Note  Patient: Jane Schultz  Procedure(s) Performed: Incision and Drainage  Patient Location: PACU  Anesthesia Type:General  Level of Consciousness: drowsy  Airway & Oxygen Therapy: Patient Spontanous Breathing and Patient connected to face mask oxygen  Post-op Assessment: Report given to RN  Post vital signs: stable  Last Vitals:  Vitals Value Taken Time  BP 145/79 05/13/21 1848  Temp    Pulse 98 05/13/21 1849  Resp 24 05/13/21 1849  SpO2 100 % 05/13/21 1849  Vitals shown include unvalidated device data.  Last Pain:  Vitals:   05/13/21 1724  TempSrc:   PainSc: 0-No pain      Patients Stated Pain Goal: 0 (65/53/74 8270)  Complications: No notable events documented.

## 2021-05-13 NOTE — Anesthesia Postprocedure Evaluation (Signed)
Anesthesia Post Note  Patient: Jane Schultz  Procedure(s) Performed: Incision and Drainage  Patient location during evaluation: PACU Anesthesia Type: General Level of consciousness: awake and alert Pain management: pain level controlled Vital Signs Assessment: post-procedure vital signs reviewed and stable Respiratory status: spontaneous breathing, nonlabored ventilation, respiratory function stable and patient connected to nasal cannula oxygen Cardiovascular status: blood pressure returned to baseline and stable Postop Assessment: no apparent nausea or vomiting Anesthetic complications: no   No notable events documented.   Last Vitals:  Vitals:   05/13/21 1915 05/13/21 1919  BP: (!) 175/86 (!) 165/94  Pulse: 75 76  Resp: 20 18  Temp: 36.6 C   SpO2: 98% 98%    Last Pain:  Vitals:   05/13/21 1915  TempSrc:   PainSc: [place]

## 2021-05-13 NOTE — Op Note (Signed)
Procedure(s): Incision and Drainage Procedure Note  Jane Schultz female 41 y.o. 05/13/2021  Indications: The patient is a 41 y.o. female with persistent left vulvar abscess with surrounding cellulitis, Type II DM, HTN.  Previously had bedside I&D 4 days ago.   Pre-operative Diagnosis: Left vulvar abscess, cellulitis (phlegmon) of mons pubis  Post-operative Diagnosis: Same  Surgeon: Rubie Maid, MD  Assistants:  None.   Anesthesia: General endotracheal anesthesia  Procedure Details: The patient was seen in the Holding Room. The risks, benefits, complications, treatment options, and expected outcomes were discussed with the patient.  The patient concurred with the proposed plan, giving informed consent.  The site of surgery properly noted/marked. The patient was taken to the Operating Room, identified as Jane Schultz and the procedure verified as Procedure(s) (LRB): Incision and Drainage (N/A). A Time Out was held and the above information confirmed.  She was then placed under general anesthesia without difficulty. She was placed in the dorsal lithotomy position, and was prepped and draped in a sterile manner.  A straight catheterization was performed.   The previous I&D incision was identified along the left vulva approximately at the midline.  That incision was then extended using an 11 blade.  Approximately 50 cc of purulent drainage was noted.  Next a hemostat was used to explore the incision site and the surrounding tissues, with phlegmon debris removed with possible abscess capsule included.  With continued exploration of ports, a track was noted between the left vulva and the mons pubis.  More purulent fluid was expressed, however firmness of the tissue remained.  A decision was made to make a second incision in the mons pubis to assess if a second loculated abscess was present versus significant edematous tissue.  Exploration of the second incision using a  hemostat did not return much in the form of purulent fluid.  It was determined that this area was related to cellulitis.  After adequate exploration, normal saline was used to flush the abscess site as well as the second incision at the mons pubis.  Blood-tinged saline was then expressed from the sites.  The decision was made to insert a Penrose drain at the initial site of the abscess.  A hemostat was used to place the drain.  A suture of 2-0 silk was used to anchor the drain and partially close the incision.  The drain was left to drain to gravity.  The mons pubis incision was then closed with a figure-of-eight suture of 4-0 Vicryl.  Dermabond was placed over this incision.  A total of 20 cc of 1% lidocaine was injected into the incisions.  All instrument and sponge counts were correct x2 at the end of the case.  Patient tolerated the procedure well she was awakened from anesthesia and taken to the recovery room in stable condition.   Findings: Moderate edema of the left mons pubis and vulva.  Scant purulent drainage noted from previous I&D site.    Estimated Blood Loss:  10 ml      Drains: straight catheterization prior to procedure with 300 ml of clear urine         Total IV Fluids:  875 ml  Specimens: None         Implants: None         Complications:  None; patient tolerated the procedure well.         Disposition: PACU - hemodynamically stable.         Condition: stable   Nash Bolls  Marcelline Mates, MD [facility]

## 2021-05-13 NOTE — Progress Notes (Addendum)
Progress Note    Trust Jackson-Williams  YNW:[id number] DOB: 02-18-1981  DOA: 05/05/2021 PCP: Patient, No Pcp Per (Inactive)      Brief Narrative:    Medical records reviewed and are as summarized below:  Jane Schultz is a 41 y.o. female with medical history significant for type 2 diabetes mellitus, hypertension, asthma, deafness, who presented to the hospital because of pain in the left labia.  She was febrile and tachycardic in the emergency room with white cell count of 18,000.  She was admitted to the hospital for sepsis secondary to vulva/mons cellulitis.  She was treated with IV fluids, analgesics and empiric IV antibiotics.    ,      Assessment/Plan:   Principal Problem:   Cellulitis of labia majora Active Problems:   HTN (hypertension)   Deaf   Sepsis ([place])   Hyperglycemia due to type 2 diabetes mellitus ([place])   Pressure injury of skin   Body mass index is 31.95 kg/m.  (Morbid obesity)  Sepsis secondary to left-sided vulva/labia and mons pubis cellulitis with abscess, fever, leukocytosis : S/p I&D of left labia abscess on 05/09/2021.  Wound culture showed rare staph lugdunensis, rare strep group F, few Prevotella bivia.  Plan for repeat I&D of left labia and mons pubis abscess today.  Continue IV Unasyn and vancomycin.  Insulin-dependent type 2 DM with hyperglycemia: Overall glucose levels are better.  Continue insulin glargine and NovoLog.  Hypertension: Continue lisinopril and metoprolol.  Constipation: Continue laxatives as needed.  Stage II right buttock decubitus ulcer: Continue local wound care with zinc oxide  Nausea, vomiting, hyponatremia and hypokalemia: Improved: Monitor BMP  Sign language interpretation service via iPad was used for this encounter  Diet Order             Diet NPO time specified  Diet effective now                      Consultants: Gynecologist  Procedures: I&D of left labial abscess on  05/10/2021    Medications:    acetaminophen  1,000 mg Oral On Call to OR   atorvastatin  20 mg Oral Daily   bisacodyl  10 mg Oral Once   enoxaparin (LOVENOX) injection  0.5 mg/kg Subcutaneous Q24H   influenza vac split quadrivalent PF  0.5 mL Intramuscular Tomorrow-1000   insulin aspart  0-20 Units Subcutaneous TID WC   insulin aspart  0-5 Units Subcutaneous QHS   insulin aspart  4 Units Subcutaneous TID WC   lisinopril  20 mg Oral Daily   metoprolol tartrate  12.5 mg Oral BID   polyethylene glycol  17 g Oral Daily   sodium chloride flush  10 mL Intravenous Q12H   sodium chloride flush  10-40 mL Intracatheter Q12H   zinc oxide   Topical BID   Continuous Infusions:  sodium chloride Stopped (05/13/21 0640)   ampicillin-sulbactam (UNASYN) IV 3 g (05/13/21 1004)   lactated ringers 125 mL/hr at 05/13/21 0645   promethazine (PHENERGAN) injection (IM or IVPB)     vancomycin Stopped (05/13/21 0630)     Anti-infectives (From admission, onward)    Start     Dose/Rate Route Frequency Ordered Stop   05/11/21 1800  Ampicillin-Sulbactam (UNASYN) 3 g in sodium chloride 0.9 % 100 mL IVPB        3 g 200 mL/hr over 30 Minutes Intravenous Every 6 hours 05/11/21 1655     05/10/21 1500  vancomycin (VANCOREADY) IVPB  1500 mg/300 mL        1,500 mg 150 mL/hr over 120 Minutes Intravenous Every 12 hours 05/10/21 0327     05/10/21 0415  vancomycin (VANCOREADY) IVPB 500 mg/100 mL        500 mg 100 mL/hr over 60 Minutes Intravenous  Once 05/10/21 0324 05/10/21 0517   05/07/21 0100  cefTRIAXone (ROCEPHIN) 2 g in sodium chloride 0.9 % 100 mL IVPB  Status:  Discontinued        2 g 200 mL/hr over 30 Minutes Intravenous Every 24 hours 05/06/21 0245 05/11/21 1653   05/06/21 1500  vancomycin (VANCOCIN) IVPB 1000 mg/200 mL premix  Status:  Discontinued        1,000 mg 200 mL/hr over 60 Minutes Intravenous Every 12 hours 05/06/21 0344 05/10/21 1112   05/06/21 0345  vancomycin (VANCOREADY) IVPB 1250 mg/250  mL        1,250 mg 166.7 mL/hr over 90 Minutes Intravenous  Once 05/06/21 0339 05/06/21 0659   05/06/21 0230  vancomycin (VANCOCIN) IVPB 1000 mg/200 mL premix        1,000 mg 200 mL/hr over 60 Minutes Intravenous  Once 05/06/21 0216 05/06/21 0332   05/06/21 0030  cefTRIAXone (ROCEPHIN) 2 g in sodium chloride 0.9 % 100 mL IVPB        2 g 200 mL/hr over 30 Minutes Intravenous  Once 05/06/21 0023 05/06/21 0122              Family Communication/Anticipated D/C date and plan/Code Status   DVT prophylaxis: SCD's Start: 05/13/21 0524 Sequential compression device (SCD's) to OR Start: 05/13/21 0524     Code Status: Full Code  Family Communication: None Disposition Plan: Plan to discharge home in 1 to 2 days   Status is: Inpatient  Remains inpatient appropriate because: IV antibiotics           Subjective:   Interval events noted.  She still complains of pain and swelling on the left mons pubis and left labia.  Her fianc was at the bedside.  Objective:    Vitals:   05/12/21 2326 05/13/21 0342 05/13/21 0829 05/13/21 1116  BP: (!) 143/76 (!) 154/86 (!) 156/76 (!) 162/83  Pulse: 70 74 64 68  Resp: 18 20  17   Temp: 98.4 F (36.9 C) 99.5 F (37.5 C) 98.2 F (36.8 C) 98.4 F (36.9 C)  TempSrc: Oral  Oral Oral  SpO2: 99% 99% 100%   Weight:      Height:       No data found.   Intake/Output Summary (Last 24 hours) at 05/13/2021 1234 Last data filed at 05/12/2021 2030 Gross per 24 hour  Intake 540 ml  Output --  Net 540 ml   Filed Weights   05/06/21 0256  Weight: 92.5 kg    Exam:   GEN: NAD SKIN: No rash EYES: EOMI ENT: MMM CV: RRR PULM: CTA B ABD: soft, ND, NT, +BS CNS: AAO x 3, non focal EXT: No edema or tenderness GU: Swelling, induration and tenderness of the mons pubis on the left labia.  Foul-smelling serosanguineous drainage noted from incisional wound on the left labia.    Madelyn, RN was at the bedside as a chaperone     Data  Reviewed:   I have personally reviewed following labs and imaging studies:  Labs: Labs show the following:   Basic Metabolic Panel: Recent Labs  Lab 05/07/21 1146 05/08/21 2542 05/09/21 0533 05/10/21 0158 05/11/21 0610 05/12/21 7062 05/13/21  0510  NA 132* 133* 135 131* 135  --   --   K 3.1* 3.8 3.4* 3.6 3.9  --   --   CL 101 99 102 102 101  --   --   CO2 25 27 24 24 25   --   --   GLUCOSE 158* 230* 183* 201* 155*  --   --   BUN 11 14 14 13 12   --   --   CREATININE 0.72 0.81 0.77 0.80 0.96 0.87 0.67  CALCIUM 8.0* 8.2* 8.2* 7.9* 8.2*  --   --   MG 1.7 1.9 1.9  --   --   --   --   PHOS  --  2.9 2.8  --   --   --   --    GFR Estimated Creatinine Clearance: 110.3 mL/min (by C-G formula based on SCr of 0.67 mg/dL). Liver Function Tests: No results for input(s): AST, ALT, ALKPHOS, BILITOT, PROT, ALBUMIN in the last 168 hours.  No results for input(s): LIPASE, AMYLASE in the last 168 hours. No results for input(s): AMMONIA in the last 168 hours. Coagulation profile No results for input(s): INR, PROTIME in the last 168 hours.   CBC: Recent Labs  Lab 05/09/21 0533 05/10/21 0158 05/11/21 0610 05/12/21 0611 05/13/21 0510  WBC 12.9* 13.6* 14.8* 14.5* 10.9*  NEUTROABS 10.6* 11.0* 11.4* 10.9* 7.1  HGB 11.2* 11.1* 10.9* 11.2* 10.6*  HCT 33.4* 33.1* 32.3* 34.1* 32.0*  MCV 81.1 81.1 81.2 82.6 82.1  PLT 212 218 242 281 283   Cardiac Enzymes: No results for input(s): CKTOTAL, CKMB, CKMBINDEX, TROPONINI in the last 168 hours. BNP (last 3 results) No results for input(s): PROBNP in the last 8760 hours. CBG: Recent Labs  Lab 05/12/21 0829 05/12/21 1224 05/12/21 1918 05/12/21 2155 05/13/21 0900  GLUCAP 185* 171* 114* 133* 114*   D-Dimer: No results for input(s): DDIMER in the last 72 hours. Hgb A1c: No results for input(s): HGBA1C in the last 72 hours.  Lipid Profile: No results for input(s): CHOL, HDL, LDLCALC, TRIG, CHOLHDL, LDLDIRECT in the last 72 hours. Thyroid  function studies: No results for input(s): TSH, T4TOTAL, T3FREE, THYROIDAB in the last 72 hours.  Invalid input(s): FREET3 Anemia work up: No results for input(s): VITAMINB12, FOLATE, FERRITIN, TIBC, IRON, RETICCTPCT in the last 72 hours. Sepsis Labs: Recent Labs  Lab 05/10/21 0158 05/11/21 0610 05/12/21 0611 05/13/21 0510  WBC 13.6* 14.8* 14.5* 10.9*    Microbiology Recent Results (from the past 240 hour(s))  Blood culture (routine x 2)     Status: None   Collection Time: 05/06/21 12:31 AM   Specimen: BLOOD  Result Value Ref Range Status   Specimen Description BLOOD LEFT ASSIST CONTROL  Final   Special Requests   Final    BOTTLES DRAWN AEROBIC AND ANAEROBIC Blood Culture adequate volume   Culture   Final    NO GROWTH 5 DAYS Performed at [facility], [address] [place], [place] [address]    Report Status 05/11/2021 FINAL  Final  Resp Panel by RT-PCR (Flu A&B, Covid) Nasopharyngeal Swab     Status: None   Collection Time: 05/06/21 12:31 AM   Specimen: Nasopharyngeal Swab; Nasopharyngeal(NP) swabs in vial transport medium  Result Value Ref Range Status   SARS Coronavirus 2 by RT PCR NEGATIVE NEGATIVE Final    Comment: (NOTE) SARS-CoV-2 target nucleic acids are NOT DETECTED.  The SARS-CoV-2 RNA is generally detectable in upper respiratory specimens during the  acute phase of infection. The lowest concentration of SARS-CoV-2 viral copies this assay can detect is 138 copies/mL. A negative result does not preclude SARS-Cov-2 infection and should not be used as the sole basis for treatment or other patient management decisions. A negative result may occur with  improper specimen collection/handling, submission of specimen other than nasopharyngeal swab, presence of viral mutation(s) within the areas targeted by this assay, and inadequate number of viral copies(<138 copies/mL). A negative result must be combined with clinical observations, patient history, and  epidemiological information. The expected result is Negative.  Fact Sheet for Patients:  EntrepreneurPulse.com.au  Fact Sheet for Healthcare Providers:  IncredibleEmployment.be  This test is no t yet approved or cleared by the [place] FDA and  has been authorized for detection and/or diagnosis of SARS-CoV-2 by FDA under an Emergency Use Authorization (EUA). This EUA will remain  in effect (meaning this test can be used) for the duration of the COVID-19 declaration under Section 564(b)(1) of the Act, 21 U.S.C.section 360bbb-3(b)(1), unless the authorization is terminated  or revoked sooner.       Influenza A by PCR NEGATIVE NEGATIVE Final   Influenza B by PCR NEGATIVE NEGATIVE Final    Comment: (NOTE) The Xpert Xpress SARS-CoV-2/FLU/RSV plus assay is intended as an aid in the diagnosis of influenza from Nasopharyngeal swab specimens and should not be used as a sole basis for treatment. Nasal washings and aspirates are unacceptable for Xpert Xpress SARS-CoV-2/FLU/RSV testing.  Fact Sheet for Patients: EntrepreneurPulse.com.au  Fact Sheet for Healthcare Providers: IncredibleEmployment.be  This test is not yet approved or cleared by the [place] FDA and has been authorized for detection and/or diagnosis of SARS-CoV-2 by FDA under an Emergency Use Authorization (EUA). This EUA will remain in effect (meaning this test can be used) for the duration of the COVID-19 declaration under Section 564(b)(1) of the Act, 21 U.S.C. section 360bbb-3(b)(1), unless the authorization is terminated or revoked.  Performed at [facility], [address] [place], [place] [address]   Culture, blood (Routine X 2) w Reflex to ID Panel     Status: None   Collection Time: 05/06/21  6:29 AM   Specimen: BLOOD  Result Value Ref Range Status   Specimen Description BLOOD RIGHT ANTECUBITAL  Final   Special  Requests   Final    BOTTLES DRAWN AEROBIC AND ANAEROBIC Blood Culture adequate volume   Culture   Final    NO GROWTH 5 DAYS Performed at [facility], [address] [place], [place] [address]    Report Status 05/11/2021 FINAL  Final  Aerobic/Anaerobic Culture w Gram Stain (surgical/deep wound)     Status: None   Collection Time: 05/09/21  3:06 PM   Specimen: Vulva; Abscess  Result Value Ref Range Status   Specimen Description   Final    VULVA Performed at [facility], [address], [place], [place] [address]    Special Requests   Final    NONE Performed at [facility], [place], [place] [address]    Gram Stain   Final    RARE WBC PRESENT,BOTH PMN AND MONONUCLEAR NO ORGANISMS SEEN    Culture   Final    RARE STAPHYLOCOCCUS LUGDUNENSIS RARE STREPTOCOCCUS GROUP F Beta hemolytic streptococci are predictably susceptible to penicillin and other beta lactams. Susceptibility testing not routinely performed. FEW PREVOTELLA BIVIA BETA LACTAMASE POSITIVE Performed at [facility], [place] [address], [place], [place] [address]  Report Status 22-May-2021 FINAL  Final   Organism ID, Bacteria STAPHYLOCOCCUS LUGDUNENSIS  Final      Susceptibility   Staphylococcus lugdunensis - MIC*    CIPROFLOXACIN <=0.5 SENSITIVE Sensitive     ERYTHROMYCIN <=0.25 SENSITIVE Sensitive     GENTAMICIN <=0.5 SENSITIVE Sensitive     OXACILLIN >=4 RESISTANT Resistant     TETRACYCLINE <=1 SENSITIVE Sensitive     VANCOMYCIN <=0.5 SENSITIVE Sensitive     TRIMETH/SULFA <=10 SENSITIVE Sensitive     CLINDAMYCIN <=0.25 SENSITIVE Sensitive     RIFAMPIN <=0.5 SENSITIVE Sensitive     Inducible Clindamycin NEGATIVE Sensitive     * RARE STAPHYLOCOCCUS LUGDUNENSIS  Aerobic/Anaerobic Culture w Gram Stain (surgical/deep wound)     Status: None (Preliminary result)   Collection Time: 05/11/21  1:03 PM   Specimen: Vulva; Abscess  Result Value Ref Range Status    Specimen Description   Final    VULVA Performed at [facility], [address] [place], [place] [address]    Special Requests   Final    NONE Performed at [facility], [address] [place], [place] [address]    Gram Stain   Final    FEW WBC PRESENT, PREDOMINANTLY MONONUCLEAR FEW GRAM NEGATIVE RODS FEW GRAM POSITIVE COCCI IN PAIRS    Culture   Final    RARE GRAM NEGATIVE RODS FEW STAPHYLOCOCCUS EPIDERMIDIS MODERATE STREPTOCOCCUS GROUP F Beta hemolytic streptococci are predictably susceptible to penicillin and other beta lactams. Susceptibility testing not routinely performed. SUSCEPTIBILITIES TO FOLLOW HOLDING FOR POSSIBLE ANAEROBE Performed at [facility], [place] [address], [place], [place] [address]    Report Status PENDING  Incomplete    Procedures and diagnostic studies:  US RENAL  Result Date: 05/22/2021 CLINICAL DATA:  Left flank pain EXAM: RENAL / URINARY TRACT ULTRASOUND COMPLETE COMPARISON:  CT 05/06/2021 FINDINGS: Right Kidney: Renal measurements: 11.9 x 5.2 x 6.1 cm = volume: 197 mL. Echogenicity within normal limits. No mass or hydronephrosis visualized. Left Kidney: Renal measurements: 11.5 x 5.6 x 6.7 cm = volume: 225 mL. Echogenicity within normal limits. No mass or hydronephrosis visualized. Bladder: Appears normal for degree of bladder distention. Other: None. IMPRESSION: Negative examination.  No hydronephrosis. Electronically Signed   By: Donavan Foil M.D.   On: 05-22-21 15:37               LOS: 7 days   Lemoine Goyne  [facility]   Pager on [email or web address]. If 7PM-7AM, please contact night-coverage at [email or web address]     05/13/2021, 12:34 PM

## 2021-05-13 NOTE — Progress Notes (Signed)
Jane Schultz is a 41 y.o. hearing impaired female admitted on 05/05/2021 with sepsis secondary to left-sided vulva and mons cellulitis and suspected abscess. Currently receiving IV antibiotics. PMH includes Type II DM, HTN.  Post-Operative Day #4 s/p bedside I&D of vulvar abscess.   ASL interpreter [phone], [name]) used.   Subjective: up ad lib, voiding, and tolerating PO.  Patient notes that she is desiring surgical intervention.    Objective: Temp:  [98.4 F (36.9 C)-99.5 F (37.5 C)] 99.5 F (37.5 C) (01/26 0342) Pulse Rate:  [66-76] 74 (01/26 0342) Resp:  [17-20] 20 (01/26 0342) BP: (143-164)/(72-86) 154/86 (01/26 0342) SpO2:  [99 %-100 %] 99 % (01/26 0342)  Physical Exam:  General: alert and no distress  Lungs: clear to auscultation bilaterally Heart: regular rate and rhythm, S1, S2 normal, no murmur, click, rub or gallop Abdomen: soft, non-tender; bowel sounds normal; no masses,  no organomegaly Pelvis: external genitalia with suspected loculated abscess collection at mons pubis of left labia.  Labia majora fluctuant, did not palpate or express this morning.    Labs:  CBC Latest Ref Rng & Units 05/13/2021 05/12/2021 05/11/2021  WBC 4.0 - 10.5 K/uL 10.9(H) 14.5(H) 14.8(H)  Hemoglobin 12.0 - 15.0 g/dL 10.6(L) 11.2(L) 10.9(L)  Hematocrit 36.0 - 46.0 % 32.0(L) 34.1(L) 32.3(L)  Platelets 150 - 400 K/uL 283 281 242     Lab Results  Component Value Date   CREATININE 0.67 05/13/2021   CREATININE 0.87 05/12/2021   CREATININE 0.96 05/11/2021       Assessment/Plan:  1. Right vulvar cellulitis  with abscess - WBC now trending, also has been afebrile for more than 24 hours.  Abscess continuing to drain slowly. Continue antibiotics, now on Unasyn and Vancomycin. Plan for I&D in OR later today. NPO after breakfast.  2. HTN and DM to be managed by Hospitalist.    LOS:  7    Rubie Maid, MD [facility]

## 2021-05-13 NOTE — Progress Notes (Signed)
Notified Dr. Bertell Maria, anesthesia, patient ate breakfast (cream of wheat, yogurt, and raisin bran) and finished at 9:30am, but also had some emesis after.  He acknowledged and said it would need to be 8 hours before starting surgery.

## 2021-05-14 ENCOUNTER — Encounter: Payer: Self-pay | Admitting: Obstetrics and Gynecology

## 2021-05-14 LAB — CBC WITH DIFFERENTIAL/PLATELET
Abs Immature Granulocytes: 0.09 10*3/uL — ABNORMAL HIGH (ref 0.00–0.07)
Basophils Absolute: 0 10*3/uL (ref 0.0–0.1)
Basophils Relative: 0 %
Eosinophils Absolute: 0 10*3/uL (ref 0.0–0.5)
Eosinophils Relative: 0 %
HCT: 34.1 % — ABNORMAL LOW (ref 36.0–46.0)
Hemoglobin: 11 g/dL — ABNORMAL LOW (ref 12.0–15.0)
Immature Granulocytes: 1 %
Lymphocytes Relative: 12 %
Lymphs Abs: 1.5 10*3/uL (ref 0.7–4.0)
MCH: 27.1 pg (ref 26.0–34.0)
MCHC: 32.3 g/dL (ref 30.0–36.0)
MCV: 84 fL (ref 80.0–100.0)
Monocytes Absolute: 0.3 10*3/uL (ref 0.1–1.0)
Monocytes Relative: 3 %
Neutro Abs: 10.6 10*3/uL — ABNORMAL HIGH (ref 1.7–7.7)
Neutrophils Relative %: 84 %
Platelets: 309 10*3/uL (ref 150–400)
RBC: 4.06 MIL/uL (ref 3.87–5.11)
RDW: 14 % (ref 11.5–15.5)
WBC: 12.6 10*3/uL — ABNORMAL HIGH (ref 4.0–10.5)
nRBC: 0 % (ref 0.0–0.2)

## 2021-05-14 LAB — GLUCOSE, CAPILLARY
Glucose-Capillary: 152 mg/dL — ABNORMAL HIGH (ref 70–99)
Glucose-Capillary: 210 mg/dL — ABNORMAL HIGH (ref 70–99)
Glucose-Capillary: 263 mg/dL — ABNORMAL HIGH (ref 70–99)
Glucose-Capillary: 263 mg/dL — ABNORMAL HIGH (ref 70–99)
Glucose-Capillary: 271 mg/dL — ABNORMAL HIGH (ref 70–99)

## 2021-05-14 LAB — AEROBIC/ANAEROBIC CULTURE W GRAM STAIN (SURGICAL/DEEP WOUND)

## 2021-05-14 LAB — BASIC METABOLIC PANEL
Anion gap: 7 (ref 5–15)
BUN: 10 mg/dL (ref 6–20)
CO2: 26 mmol/L (ref 22–32)
Calcium: 8.1 mg/dL — ABNORMAL LOW (ref 8.9–10.3)
Chloride: 100 mmol/L (ref 98–111)
Creatinine, Ser: 0.76 mg/dL (ref 0.44–1.00)
GFR, Estimated: >60 mL/min (ref >60)
Glucose, Bld: 277 mg/dL — ABNORMAL HIGH (ref 70–99)
Potassium: 4.3 mmol/L (ref 3.5–5.1)
Sodium: 133 mmol/L — ABNORMAL LOW (ref 135–145)

## 2021-05-14 MED ORDER — SULFAMETHOXAZOLE-TRIMETHOPRIM 800-160 MG PO TABS: 1.0000 | ORAL_TABLET | Freq: Two times a day (BID) | ORAL | 0 refills | Status: DC

## 2021-05-14 MED ORDER — LISINOPRIL 20 MG PO TABS: 20.0000 mg | ORAL_TABLET | Freq: Every day | ORAL | 0 refills | Status: DC

## 2021-05-14 MED ORDER — METFORMIN HCL 850 MG PO TABS: 850.0000 mg | ORAL_TABLET | Freq: Two times a day (BID) | ORAL | 0 refills | Status: DC

## 2021-05-14 MED ORDER — HYDROCHLOROTHIAZIDE 12.5 MG PO TABS
12.5000 mg | ORAL_TABLET | Freq: Every day | ORAL | Status: DC
  Administered 2021-05-14: 12.5 mg via ORAL
  Filled 2021-05-14: qty 1

## 2021-05-14 MED ORDER — HYDROCODONE-ACETAMINOPHEN 5-325 MG PO TABS: 1.0000 | ORAL_TABLET | Freq: Four times a day (QID) | ORAL | 0 refills | Status: AC | PRN

## 2021-05-14 MED ORDER — METOPROLOL TARTRATE 25 MG PO TABS: 25.0000 mg | ORAL_TABLET | Freq: Two times a day (BID) | ORAL | 0 refills | Status: DC

## 2021-05-14 MED ORDER — SULFAMETHOXAZOLE-TRIMETHOPRIM 800-160 MG PO TABS: 1.0000 | ORAL_TABLET | Freq: Two times a day (BID) | ORAL | 0 refills | Status: AC

## 2021-05-14 MED ORDER — ATORVASTATIN CALCIUM 20 MG PO TABS
20.0000 mg | ORAL_TABLET | Freq: Every day | ORAL | 0 refills | Status: DC
Start: 2021-05-14 — End: 2021-05-14

## 2021-05-14 MED ORDER — AMOXICILLIN-POT CLAVULANATE 875-125 MG PO TABS: 1.0000 | ORAL_TABLET | Freq: Two times a day (BID) | ORAL | 0 refills | Status: AC

## 2021-05-14 MED ORDER — HYDROCHLOROTHIAZIDE 12.5 MG PO TABS: 12.5000 mg | ORAL_TABLET | Freq: Every day | ORAL | 0 refills | Status: DC

## 2021-05-14 MED ORDER — AMOXICILLIN-POT CLAVULANATE 875-125 MG PO TABS
1.0000 | ORAL_TABLET | Freq: Two times a day (BID) | ORAL | Status: DC
  Administered 2021-05-14: 1 via ORAL
  Filled 2021-05-14 (×2): qty 1

## 2021-05-14 MED ORDER — ZINC OXIDE 20 % EX OINT: TOPICAL_OINTMENT | Freq: Two times a day (BID) | CUTANEOUS | 0 refills | Status: AC

## 2021-05-14 MED ORDER — INSULIN LISPRO 100 UNIT/ML ~~LOC~~ SOLN: 5.0000 [IU] | Freq: Three times a day (TID) | SUBCUTANEOUS | 0 refills | Status: DC

## 2021-05-14 MED ORDER — INSULIN LISPRO (1 UNIT DIAL) 100 UNIT/ML (KWIKPEN): 5.0000 [IU] | PEN_INJECTOR | Freq: Three times a day (TID) | SUBCUTANEOUS | 0 refills | Status: DC

## 2021-05-14 MED ORDER — INSULIN GLARGINE 100 UNIT/ML ~~LOC~~ SOLN
40.0000 [IU] | Freq: Every day | SUBCUTANEOUS | 0 refills | Status: DC
Start: 2021-05-14 — End: 2021-05-14

## 2021-05-14 MED ORDER — AMOXICILLIN-POT CLAVULANATE 875-125 MG PO TABS: 1.0000 | ORAL_TABLET | Freq: Two times a day (BID) | ORAL | 0 refills | Status: DC

## 2021-05-14 MED ORDER — ATORVASTATIN CALCIUM 20 MG PO TABS: 20.0000 mg | ORAL_TABLET | Freq: Every day | ORAL | 0 refills | Status: DC

## 2021-05-14 MED ORDER — SULFAMETHOXAZOLE-TRIMETHOPRIM 800-160 MG PO TABS
1.0000 | ORAL_TABLET | Freq: Two times a day (BID) | ORAL | Status: DC
  Administered 2021-05-14: 1 via ORAL
  Filled 2021-05-14 (×2): qty 1

## 2021-05-14 MED ORDER — LANTUS SOLOSTAR 100 UNIT/ML ~~LOC~~ SOPN
40.0000 [IU] | PEN_INJECTOR | Freq: Every day | SUBCUTANEOUS | 0 refills | Status: DC
Start: 2021-05-14 — End: 2022-01-04

## 2021-05-14 NOTE — Progress Notes (Signed)
Patient educated on discharge instructions, follow up appointments, and medications with the use of an interpreter. Patient verbalized understanding. Patient escorted out by auxiliary.

## 2021-05-14 NOTE — Progress Notes (Signed)
Spoke to [name] from the [facility] Department and patient is set up for an appointment March 6th at 1pm with Chaney Malling. Patient is to call [phone] if she needs an interpreter present at her follow up appointment. She can arrange transportation through the health department if needed.

## 2021-05-14 NOTE — Progress Notes (Signed)
Inpatient Diabetes Program Recommendations  AACE/ADA: New Consensus Statement on Inpatient Glycemic Control (2015)  Target Ranges:  Prepandial:   less than 140 mg/dL      Peak postprandial:   less than 180 mg/dL (1-2 hours)      Critically ill patients:  140 - 180 mg/dL    Latest Reference Range & Units 05/13/21 09:00 05/13/21 17:10 05/13/21 18:53 05/13/21 20:05  Glucose-Capillary 70 - 99 mg/dL 114 (H) 152 (H) 157 (H) 181 (H)  (H): Data is abnormally high  Latest Reference Range & Units 05/14/21 08:23 05/14/21 11:34  Glucose-Capillary 70 - 99 mg/dL 263 (H) 210 (H)  (H): Data is abnormally high     Patient states she has been taking: Lantus 30 units am + 60 units pm Not taking Humalog now but has taken in the past Metformin 500 mg am + 1 gm pm Patient has been wearing a libre sensor but ran out of refills    Current Orders: Novolog Resistant Correction Scale/ SSI (0-20 units) TID AC + HS      Novolog 4 units TID with meals      Metformin 500 mg BID     CBGs likely elevated this AM due to Decadron given yesterday around 6pm for surgery  RN asked me to meet w/ pt again to review Diabetes basics.  Used ASL interpreter via the [employer].  Spoke with patient about her current A1c of 10.6%.  Explained what an A1c is and what it measures.  Reminded patient that her goal A1c is 7% or less per ADA standards to prevent both acute and long-term complications.  Explained to patient the extreme importance of good glucose control at home especially to help pt heal from her current infection and surgeries.  Encouraged patient to check her CBGs at least QID at home with her Freestyle Libre CGM system and to record all CBGs in a logbook for her PCP  to review (has appt with [facility] for 03/06 at 1pm).  Pt asking me for more samples of the Freestyle Libre CGM, however, pt was already given 2 sample sensors earlier in admission and has them at bedside.  Unfortunately we cannot  provide more than 2 samples to pt.  Pt worried about not having more sensors and I told pt that these 2 sensors will last for 4 weeks which will be veryt close to her next PCP appt where she can ask her PCP for more refills on the sensors.  If pt needs something to check CBGs if her sensors run out by the time of her PCP appt, pt instructed to go to [facility] and buy inexpensive traditional CBG meter OTC that she can use for fingerstick CBGs until she sees her PCP. Pt did not seem happy with this answer.  I instructed pt to use her tablet (her SO was using at bedside) and to go on the [employer] site to see if she can qualify for future assistance with her sensor refills.    Also discussed DM diet information with patient.  Encouraged patient to avoid beverages with sugar (regular soda, sweet tea, lemonade, fruit juice) and to consume mostly water.  Discussed what foods contain carbohydrates and how carbohydrates affect the body's blood sugar levels.  Encouraged patient to be careful with her portion sizes (especially grains, starchy vegetables, and fruits).     --Will follow patient during hospitalization--  Wyn Quaker RN, MSN, CDE Diabetes Coordinator Inpatient Glycemic Control  Team Team Pager: [phone] (8a-5p)

## 2021-05-14 NOTE — Progress Notes (Signed)
GYN INPATIENT PROGRESS NOTE  Jane Schultz is a 41 y.o. hearing impaired female admitted on 05/05/2021 with sepsis secondary to left-sided vulva and mons cellulitis and suspected abscess. Currently receiving IV antibiotics. PMH includes Type II DM, HTN.  Post-Operative Day #5 s/p bedside I&D of vulvar abscess, now POD#1 s/p repeat I&D in OR.   ASL interpreter [phone] used.   Subjective: up ad lib, voiding, and tolerating PO.  Patient reports only mild pain at incision site.  Denies any major drainage from wound.    Objective: Temp:  [97.1 F (36.2 C)-98.6 F (37 C)] 98.4 F (36.9 C) (01/27 0805) Pulse Rate:  [54-97] 54 (01/27 0805) Resp:  [14-24] 18 (01/27 0805) BP: (145-184)/(44-94) 168/72 (01/27 0805) SpO2:  [97 %-100 %] 99 % (01/27 0805)  Physical Exam:  General: alert and no distress  Lungs: clear to auscultation bilaterally Heart: regular rate and rhythm, S1, S2 normal, no murmur, click, rub or gallop Abdomen: soft, non-tender; bowel sounds normal; no masses,  no organomegaly Pelvis: external genitalia with mild to moderate firm edema at left mons pubis, however decreasing since surgical intervention. Left labia with some firm edema of left labia, drain appears in place.     Labs:  CBC Latest Ref Rng & Units 05/14/2021 05/13/2021 05/12/2021  WBC 4.0 - 10.5 K/uL 12.6(H) 10.9(H) 14.5(H)  Hemoglobin 12.0 - 15.0 g/dL 11.0(L) 10.6(L) 11.2(L)  Hematocrit 36.0 - 46.0 % 34.1(L) 32.0(L) 34.1(L)  Platelets 150 - 400 K/uL 309 283 281     Lab Results  Component Value Date   CREATININE 0.76 05/14/2021   CREATININE 0.67 05/13/2021   CREATININE 0.87 05/12/2021    Aerobic/Anaerobic culture Component 3 d ago  Specimen Description VULVA  Performed at [facility], [address], [place], [place] [address]   Special Requests NONE  Performed at [facility], [address] [place], [place] [address]   Gram Stain FEW WBC PRESENT, PREDOMINANTLY  MONONUCLEAR  FEW GRAM NEGATIVE RODS  FEW GRAM POSITIVE COCCI IN PAIRS   Culture RARE GRAM NEGATIVE RODS  FEW STAPHYLOCOCCUS EPIDERMIDIS  MODERATE STREPTOCOCCUS GROUP F  Beta hemolytic streptococci are predictably susceptible to penicillin and other beta lactams. Susceptibility testing not routinely performed.  SUSCEPTIBILITIES TO FOLLOW  HOLDING FOR POSSIBLE ANAEROBE  Performed at [facility], [place] [address], [place], [place] [address]     Assessment/Plan:  1. Right vulvar cellulitis  with abscess - WBC  with mild increase, likely secondary to recent surgery. No erythema of incision site present. Patient remains afebrile. At this time, patient can likely be transitioned to PO antibiotics (would recommend Bactrim DS for 7-10 days if no allergies).  Discussed management of drain with patient. Will f/u in office on Tuesday for removal.  2. HTN and DM to be managed by Hospitalist. Strongly encourage tighter control to decrease risk of prolonged or superimposed infections.     LOS:  8    Rubie Maid, MD [facility]

## 2021-05-14 NOTE — TOC Progression Note (Signed)
Transition of Care [facility]) - Progression Note    Patient Details  Name: Jane Schultz MRN: [id number] Date of Birth: 06/13/1980  [facility] CM/SW [place], RN Phone Number: 05/14/2021, 11:09 AM  Clinical Narrative:     PCP must be made through [facility] @ [phone] 8:00am - 5:00pm per insurance as this is assigned PCP for patient. Clinic will assist with ASL but needs to be notified when appointment is made.         Expected Discharge Plan and Services                                                 Social Determinants of Health (SDOH) Interventions    Readmission Risk Interventions No flowsheet data found.

## 2021-05-14 NOTE — Discharge Summary (Signed)
Physician Discharge Summary  Deaira Leckey KTG:[id number] DOB: May 21, 1980 DOA: 05/05/2021  PCP: Patient, No Pcp Per (Inactive)  Admit date: 05/05/2021 Discharge date: 05/14/2021  Discharge disposition: Home   Recommendations for Outpatient Follow-Up:   Follow-up with PCP as scheduled in March 2023 Follow-up with Dr. Marcelline Mates, gynecologist on November 15, 2021  Discharge Diagnosis:   Principal Problem:   Cellulitis of labia majora Active Problems:   HTN (hypertension)   Deaf   Sepsis ([place])   Hyperglycemia due to type 2 diabetes mellitus ([place])   Pressure injury of skin    Discharge Condition: Stable.  Diet recommendation:  Diet Order             Diet - low sodium heart healthy           Diet Carb Modified           Diet heart healthy/carb modified Room service appropriate? Yes; Fluid consistency: Thin  Diet effective now                     Code Status: Full Code     Hospital Course:   Ms. Ciji Boston is a 41 y.o. female with medical history significant for type 2 diabetes mellitus, hypertension, asthma, deafness, who presented to the hospital because of pain in the left labia.  She was febrile and tachycardic in the emergency room with white cell count of 18,000.  She was admitted to the hospital for sepsis secondary to vulva left labia and mons pubis cellulitis and abscess.  She was treated with IV fluids, analgesics and empiric IV antibiotics.  She underwent I&D of the abscess.  Repeat I&D of the abscess was done because of lack of improvement delayed healing.  She also had uncontrolled hypertension and severe hyperglycemia from type 2 diabetes mellitus.  She was medically nonadherent and attributed this to difficulty getting an appointment for follow-up.  She was treated with antihypertensives and insulin.  Her condition slowly improved and she is deemed stable for discharge home today.  She was given prescriptions for  antihypertensives, hypoglycemic agents, antibiotics and analgesics.  Discharge plan was discussed with the patient and her husband at the bedside using interpretation service via iPad.  Anne, her nurse was at the bedside during this encounter.      Medical Consultants:   Gynecologist   Discharge Exam:    Vitals:   05/14/21 0400 05/14/21 0805 05/14/21 1037 05/14/21 1218  BP: (!) 160/85 (!) 168/72 (!) 157/64 (!) 149/81  Pulse:  (!) 54 (!) 57 65  Resp:  18    Temp:  98.4 F (36.9 C)    TempSrc:      SpO2: 100% 99%  100%  Weight:      Height:         GEN: NAD SKIN: Warm and dry EYES: No pallor or icterus ENT: MMM CV: RRR PULM: CTA B ABD: soft, obese, NT, +BS CNS: AAO x 3, non focal EXT: No edema or tenderness GU: Improved swelling, tenderness and induration of the left mons pubis and labia.  Serosanguineous drainage from the incisional wound on the left labia.      Webb Silversmith, RN, was at the bedside as a chaperone    The results of significant diagnostics from this hospitalization (including imaging, microbiology, ancillary and laboratory) are listed below for reference.     Procedures and Diagnostic Studies:   CT ABDOMEN PELVIS W CONTRAST  Result Date: 05/06/2021 CLINICAL DATA:  Evaluate for  possible abscess to the right buttocks and groin. EXAM: CT ABDOMEN AND PELVIS WITH CONTRAST TECHNIQUE: Multidetector CT imaging of the abdomen and pelvis was performed using the standard protocol following bolus administration of intravenous contrast. RADIATION DOSE REDUCTION: This exam was performed according to the departmental dose-optimization program which includes automated exposure control, adjustment of the mA and/or kV according to patient size and/or use of iterative reconstruction technique. CONTRAST:  183m OMNIPAQUE IOHEXOL 300 MG/ML  SOLN COMPARISON:  Sep 10, 2018 FINDINGS: Lower chest: No acute abnormality. Hepatobiliary: No focal liver abnormality is seen. Status post  cholecystectomy. No biliary dilatation. Pancreas: Unremarkable. No pancreatic ductal dilatation or surrounding inflammatory changes. Spleen: Normal in size without focal abnormality. Adrenals/Urinary Tract: A stable 3.6 cm x 1.1 cm homogeneous low-attenuation (approximately 47.86 Hounsfield units) right adrenal mass is seen. Mild, stable diffuse enlargement of the left adrenal gland is seen. Kidneys are normal, without renal calculi, focal lesion, or hydronephrosis. Bladder is unremarkable. Stomach/Bowel: Stomach is within normal limits. Appendix appears normal. No evidence of bowel wall thickening, distention, or inflammatory changes. Vascular/Lymphatic: No significant vascular findings are present. There is mild bilateral inguinal lymphadenopathy. Reproductive: Uterus and bilateral adnexa are unremarkable. Other: Moderate severity subcutaneous inflammatory fat stranding is seen along the vulva on the left. This extends superiorly to the inferior aspect of the left inguinal region. A 2.2 cm x 1.2 cm area of homogeneous low attenuation (approximately 55.50 Hounsfield units) is noted slightly to the right of midline along the posterior aspect of the vulva on the right. No associated inflammatory fat stranding is clearly identified within the vulva on the right. No abdominopelvic ascites. Musculoskeletal: No acute or significant osseous findings. IMPRESSION: 1. Moderate to marked severity cellulitis along the vulva on the left, extending superiorly to the inferior aspect of the left inguinal region. 2. 2.2 cm x 1.2 cm area of homogeneous low attenuation along the posterior aspect of the vulva on the right. This may represent a small abscess. 3. 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 4. Stable diffuse enlargement of the left adrenal gland. 5. Evidence of prior cholecystectomy. Electronically Signed   By: TVirgina NorfolkM.D.   On: 05/06/2021 01:52     Labs:   Basic Metabolic  Panel: Recent Labs  Lab 05/08/21 0648 05/09/21 0533 05/10/21 0158 05/11/21 0610 05/12/21 0611 05/13/21 0510 05/14/21 0640  NA 133* 135 131* 135  --   --  133*  K 3.8 3.4* 3.6 3.9  --   --  4.3  CL 99 102 102 101  --   --  100  CO2 27 24 24 25   --   --  26  GLUCOSE 230* 183* 201* 155*  --   --  277*  BUN 14 14 13 12   --   --  10  CREATININE 0.81 0.77 0.80 0.96 0.87 0.67 0.76  CALCIUM 8.2* 8.2* 7.9* 8.2*  --   --  8.1*  MG 1.9 1.9  --   --   --   --   --   PHOS 2.9 2.8  --   --   --   --   --    GFR Estimated Creatinine Clearance: 110.3 mL/min (by C-G formula based on SCr of 0.76 mg/dL). Liver Function Tests: No results for input(s): AST, ALT, ALKPHOS, BILITOT, PROT, ALBUMIN in the last 168 hours. No results for input(s): LIPASE, AMYLASE in the last 168 hours. No results for input(s): AMMONIA in the last 168  hours. Coagulation profile No results for input(s): INR, PROTIME in the last 168 hours.  CBC: Recent Labs  Lab 05/10/21 0158 05/11/21 0610 05/12/21 0611 05/13/21 0510 05/14/21 0640  WBC 13.6* 14.8* 14.5* 10.9* 12.6*  NEUTROABS 11.0* 11.4* 10.9* 7.1 10.6*  HGB 11.1* 10.9* 11.2* 10.6* 11.0*  HCT 33.1* 32.3* 34.1* 32.0* 34.1*  MCV 81.1 81.2 82.6 82.1 84.0  PLT 218 242 281 283 309   Cardiac Enzymes: No results for input(s): CKTOTAL, CKMB, CKMBINDEX, TROPONINI in the last 168 hours. BNP: Invalid input(s): POCBNP CBG: Recent Labs  Lab 05/13/21 2005 05/14/21 0036 05/14/21 0223 05/14/21 0823 05/14/21 1134  GLUCAP 181* 263* 271* 263* 210*   D-Dimer No results for input(s): DDIMER in the last 72 hours. Hgb A1c No results for input(s): HGBA1C in the last 72 hours. Lipid Profile No results for input(s): CHOL, HDL, LDLCALC, TRIG, CHOLHDL, LDLDIRECT in the last 72 hours. Thyroid function studies No results for input(s): TSH, T4TOTAL, T3FREE, THYROIDAB in the last 72 hours.  Invalid input(s): FREET3 Anemia work up No results for input(s): VITAMINB12, FOLATE,  FERRITIN, TIBC, IRON, RETICCTPCT in the last 72 hours. Microbiology Recent Results (from the past 240 hour(s))  Blood culture (routine x 2)     Status: None   Collection Time: 05/06/21 12:31 AM   Specimen: BLOOD  Result Value Ref Range Status   Specimen Description BLOOD LEFT ASSIST CONTROL  Final   Special Requests   Final    BOTTLES DRAWN AEROBIC AND ANAEROBIC Blood Culture adequate volume   Culture   Final    NO GROWTH 5 DAYS Performed at [facility], [address] [place], [place] [address]    Report Status 05/11/2021 FINAL  Final  Resp Panel by RT-PCR (Flu A&B, Covid) Nasopharyngeal Swab     Status: None   Collection Time: 05/06/21 12:31 AM   Specimen: Nasopharyngeal Swab; Nasopharyngeal(NP) swabs in vial transport medium  Result Value Ref Range Status   SARS Coronavirus 2 by RT PCR NEGATIVE NEGATIVE Final    Comment: (NOTE) SARS-CoV-2 target nucleic acids are NOT DETECTED.  The SARS-CoV-2 RNA is generally detectable in upper respiratory specimens during the acute phase of infection. The lowest concentration of SARS-CoV-2 viral copies this assay can detect is 138 copies/mL. A negative result does not preclude SARS-Cov-2 infection and should not be used as the sole basis for treatment or other patient management decisions. A negative result may occur with  improper specimen collection/handling, submission of specimen other than nasopharyngeal swab, presence of viral mutation(s) within the areas targeted by this assay, and inadequate number of viral copies(<138 copies/mL). A negative result must be combined with clinical observations, patient history, and epidemiological information. The expected result is Negative.  Fact Sheet for Patients:  EntrepreneurPulse.com.au  Fact Sheet for Healthcare Providers:  IncredibleEmployment.be  This test is no t yet approved or cleared by the [place] FDA and  has been authorized for  detection and/or diagnosis of SARS-CoV-2 by FDA under an Emergency Use Authorization (EUA). This EUA will remain  in effect (meaning this test can be used) for the duration of the COVID-19 declaration under Section 564(b)(1) of the Act, 21 U.S.C.section 360bbb-3(b)(1), unless the authorization is terminated  or revoked sooner.       Influenza A by PCR NEGATIVE NEGATIVE Final   Influenza B by PCR NEGATIVE NEGATIVE Final    Comment: (NOTE) The Xpert Xpress SARS-CoV-2/FLU/RSV plus assay is intended as an aid in the diagnosis of influenza from  Nasopharyngeal swab specimens and should not be used as a sole basis for treatment. Nasal washings and aspirates are unacceptable for Xpert Xpress SARS-CoV-2/FLU/RSV testing.  Fact Sheet for Patients: EntrepreneurPulse.com.au  Fact Sheet for Healthcare Providers: IncredibleEmployment.be  This test is not yet approved or cleared by the [place] FDA and has been authorized for detection and/or diagnosis of SARS-CoV-2 by FDA under an Emergency Use Authorization (EUA). This EUA will remain in effect (meaning this test can be used) for the duration of the COVID-19 declaration under Section 564(b)(1) of the Act, 21 U.S.C. section 360bbb-3(b)(1), unless the authorization is terminated or revoked.  Performed at [facility], [address] [place], [place] [address]   Culture, blood (Routine X 2) w Reflex to ID Panel     Status: None   Collection Time: 05/06/21  6:29 AM   Specimen: BLOOD  Result Value Ref Range Status   Specimen Description BLOOD RIGHT ANTECUBITAL  Final   Special Requests   Final    BOTTLES DRAWN AEROBIC AND ANAEROBIC Blood Culture adequate volume   Culture   Final    NO GROWTH 5 DAYS Performed at [facility], [address] [place], [place] [address]    Report Status 05/11/2021 FINAL  Final  Aerobic/Anaerobic Culture w Gram Stain (surgical/deep wound)      Status: None   Collection Time: 05/09/21  3:06 PM   Specimen: Vulva; Abscess  Result Value Ref Range Status   Specimen Description   Final    VULVA Performed at [facility], [address], [place], [place] [address]    Special Requests   Final    NONE Performed at [facility], [place], [place] [address]    Gram Stain   Final    RARE WBC PRESENT,BOTH PMN AND MONONUCLEAR NO ORGANISMS SEEN    Culture   Final    RARE STAPHYLOCOCCUS LUGDUNENSIS RARE STREPTOCOCCUS GROUP F Beta hemolytic streptococci are predictably susceptible to penicillin and other beta lactams. Susceptibility testing not routinely performed. FEW PREVOTELLA BIVIA BETA LACTAMASE POSITIVE Performed at [facility], [place] [address], [place], [place] [address]    Report Status 05/12/2021 FINAL  Final   Organism ID, Bacteria STAPHYLOCOCCUS LUGDUNENSIS  Final      Susceptibility   Staphylococcus lugdunensis - MIC*    CIPROFLOXACIN <=0.5 SENSITIVE Sensitive     ERYTHROMYCIN <=0.25 SENSITIVE Sensitive     GENTAMICIN <=0.5 SENSITIVE Sensitive     OXACILLIN >=4 RESISTANT Resistant     TETRACYCLINE <=1 SENSITIVE Sensitive     VANCOMYCIN <=0.5 SENSITIVE Sensitive     TRIMETH/SULFA <=10 SENSITIVE Sensitive     CLINDAMYCIN <=0.25 SENSITIVE Sensitive     RIFAMPIN <=0.5 SENSITIVE Sensitive     Inducible Clindamycin NEGATIVE Sensitive     * RARE STAPHYLOCOCCUS LUGDUNENSIS  Aerobic/Anaerobic Culture w Gram Stain (surgical/deep wound)     Status: None   Collection Time: 05/11/21  1:03 PM   Specimen: Vulva; Abscess  Result Value Ref Range Status   Specimen Description   Final    VULVA Performed at [facility], [address] [place], [place] [address]    Special Requests   Final    NONE Performed at [facility], [place],  [address]    Gram Stain   Final    FEW WBC PRESENT, PREDOMINANTLY MONONUCLEAR FEW GRAM NEGATIVE RODS FEW GRAM  POSITIVE COCCI IN PAIRS    Culture  Final    RARE ESCHERICHIA COLI FEW STAPHYLOCOCCUS EPIDERMIDIS MODERATE STREPTOCOCCUS GROUP F Beta hemolytic streptococci are predictably susceptible to penicillin and other beta lactams. Susceptibility testing not routinely performed. MODERATE PREVOTELLA BIVIA BETA LACTAMASE POSITIVE Performed at [facility], [place] [address], [place], [place] [address]    Report Status 05/14/2021 FINAL  Final   Organism ID, Bacteria ESCHERICHIA COLI  Final   Organism ID, Bacteria STAPHYLOCOCCUS EPIDERMIDIS  Final      Susceptibility   Escherichia coli - MIC*    AMPICILLIN >=32 RESISTANT Resistant     CEFAZOLIN <=4 SENSITIVE Sensitive     CEFEPIME <=0.12 SENSITIVE Sensitive     CEFTAZIDIME <=1 SENSITIVE Sensitive     CEFTRIAXONE <=0.25 SENSITIVE Sensitive     CIPROFLOXACIN 0.5 INTERMEDIATE Intermediate     GENTAMICIN <=1 SENSITIVE Sensitive     IMIPENEM <=0.25 SENSITIVE Sensitive     TRIMETH/SULFA >=320 RESISTANT Resistant     AMPICILLIN/SULBACTAM >=32 RESISTANT Resistant     PIP/TAZO <=4 SENSITIVE Sensitive     * RARE ESCHERICHIA COLI   Staphylococcus epidermidis - MIC*    CIPROFLOXACIN <=0.5 SENSITIVE Sensitive     ERYTHROMYCIN >=8 RESISTANT Resistant     GENTAMICIN <=0.5 SENSITIVE Sensitive     OXACILLIN >=4 RESISTANT Resistant     TETRACYCLINE <=1 SENSITIVE Sensitive     VANCOMYCIN 1 SENSITIVE Sensitive     TRIMETH/SULFA <=10 SENSITIVE Sensitive     CLINDAMYCIN RESISTANT Resistant     RIFAMPIN <=0.5 SENSITIVE Sensitive     Inducible Clindamycin POSITIVE Resistant     * FEW STAPHYLOCOCCUS EPIDERMIDIS     Discharge Instructions:   Discharge Instructions     Diet - low sodium heart healthy   Complete by: As directed    Diet Carb Modified   Complete by: As directed    Discharge wound care:   Complete by: As directed    Apply warm compresses to left labia and mons pubis to facilitate drainage   Increase activity slowly   Complete by: As  directed       Allergies as of 05/14/2021   No Known Allergies      Medication List     STOP taking these medications    ALPRAZolam 0.5 MG tablet Commonly known as: Xanax   aspirin EC 81 MG tablet   cyclobenzaprine 5 MG tablet Commonly known as: FLEXERIL       TAKE these medications    amoxicillin-clavulanate 875-125 MG tablet Commonly known as: AUGMENTIN Take 1 tablet by mouth every 12 (twelve) hours for 7 days.   atorvastatin 20 MG tablet Commonly known as: Lipitor Take 1 tablet (20 mg total) by mouth daily.   blood glucose meter kit and supplies Kit Dispense based on patient and insurance preference. Use up to four times daily as directed. (FOR ICD-9 250.00, 250.01).   hydrochlorothiazide 12.5 MG tablet Commonly known as: HYDRODIURIL Take 1 tablet (12.5 mg total) by mouth daily. Start taking on: May 15, 2021   HYDROcodone-acetaminophen 5-325 MG tablet Commonly known as: NORCO/VICODIN Take 1 tablet by mouth every 6 (six) hours as needed for up to 3 days for moderate pain.   insulin glargine 100 UNIT/ML injection Commonly known as: LANTUS Inject 0.4 mLs (40 Units total) into the skin daily. What changed:  how much to take when to take this additional instructions   insulin lispro 100 UNIT/ML injection Commonly known as: HUMALOG Inject 0.05 mLs (5 Units total) into the skin 3 (three)  times daily with meals. What changed:  how much to take when to take this   lisinopril 20 MG tablet Commonly known as: ZESTRIL Take 1 tablet (20 mg total) by mouth daily. What changed:  medication strength how much to take   metFORMIN 850 MG tablet Commonly known as: GLUCOPHAGE Take 1 tablet (850 mg total) by mouth 2 (two) times daily with a meal. What changed:  medication strength how much to take when to take this   metoprolol tartrate 25 MG tablet Commonly known as: LOPRESSOR Take 1 tablet (25 mg total) by mouth 2 (two) times daily. What changed: how  much to take   sulfamethoxazole-trimethoprim 800-160 MG tablet Commonly known as: BACTRIM DS Take 1 tablet by mouth every 12 (twelve) hours for 7 days.   zinc oxide 20 % ointment Apply topically 2 (two) times daily for 5 days.               Discharge Care Instructions  (From admission, onward)           Start     Ordered   05/14/21 0000  Discharge wound care:       Comments: Apply warm compresses to left labia and mons pubis to facilitate drainage   05/14/21 1429            Follow-up Information     Schorr, Rhetta Mura, FNP. Go on 06/21/2021.   Specialty: Family Medicine Why: Go to your PCP March 6th at 1pm at the [facility] Department. Contact information: [place] [address] [phone]         Rubie Maid, MD. Go on 05/18/2021.   Specialties: Obstetrics and Gynecology, Radiology Contact information: [place] [address] [phone]                   If you experience worsening of your admission symptoms, develop shortness of breath, life threatening emergency, suicidal or homicidal thoughts you must seek medical attention immediately by calling 911 or calling your MD immediately  if symptoms less severe.   You must read complete instructions/literature along with all the possible adverse reactions/side effects for all the medicines you take and that have been prescribed to you. Take any new medicines after you have completely understood and accept all the possible adverse reactions/side effects.    Please note   You were cared for by a hospitalist during your hospital stay. If you have any questions about your discharge medications or the care you received while you were in the hospital after you are discharged, you can call the unit and asked to speak with the hospitalist on call if the hospitalist that took care of you is not available. Once you are discharged, your primary care  physician will handle any further medical issues. Please note that NO REFILLS for any discharge medications will be authorized once you are discharged, as it is imperative that you return to your primary care physician (or establish a relationship with a primary care physician if you do not have one) for your aftercare needs so that they can reassess your need for medications and monitor your lab values.       Time coordinating discharge: 39 minutes  Signed:  Anieya Helman  [facility] 05/14/2021, 2:29 PM   Pager on [email or web address]. If 7PM-7AM, please contact night-coverage at [email or web address]

## 2021-05-17 NOTE — Progress Notes (Signed)
OBSTETRICS/GYNECOLOGY POST-OPERATIVE CLINIC VISIT  Subjective:    ALS interpreter present for today's visit.    Jane Schultz is a deaf 41 y.o. female with a PMH of HTN and DM who presents to the clinic 4 days status post Incision and Drainage  of left vulvar abscess.  She was initially admitted to the hospital for sepsis ~ 10 days ago. She was treated with IV antibiotics, and had initial bedside I&D 6 days ago. As no improvement noted, she subsequently underwent more extensive I&D in the OR. Eating a regular diet with difficulty. Bowel movements are abnormal with loose stools . Pain is not well controlled.  Medications being used: acetaminophen and ibuprofen (OTC).  Denies any fevers, chills, foul-smelling discharge. She does report compliance with her antibiotic regimen.    Past Medical History:  Diagnosis Date   Asthma    Deaf    Diabetes mellitus without complication ([place])    Hypertension     Past Surgical History:  Procedure Laterality Date   CESAREAN SECTION     EXCISION VAGINAL CYST N/A 05/13/2021   Procedure: Incision and Drainage;  Surgeon: Rubie Maid, MD;  Location: [facility] ORS;  Service: Gynecology;  Laterality: N/A;    History reviewed. No pertinent family history.  Social History   Socioeconomic History   Marital status: Divorced    Spouse name: Not on file   Number of children: Not on file   Years of education: Not on file   Highest education level: Not on file  Occupational History   Not on file  Tobacco Use   Smoking status: Never   Smokeless tobacco: Never  Vaping Use   Vaping Use: Former  Substance and Sexual Activity   Alcohol use: Never   Drug use: Yes    Types: Marijuana   Sexual activity: Yes    Birth control/protection: None  Other Topics Concern   Not on file  Social History Narrative   Not on file   Social Determinants of Health   Financial Resource Strain: Not on file  Food Insecurity: Not on file  Transportation  Needs: Not on file  Physical Activity: Not on file  Stress: Not on file  Social Connections: Not on file  Intimate Partner Violence: Not on file    Current Outpatient Medications on File Prior to Visit  Medication Sig Dispense Refill   amoxicillin-clavulanate (AUGMENTIN) 875-125 MG tablet Take 1 tablet by mouth every 12 (twelve) hours for 7 days. 14 tablet 0   atorvastatin (LIPITOR) 20 MG tablet Take 1 tablet (20 mg total) by mouth daily. 30 tablet 0   blood glucose meter kit and supplies KIT Dispense based on patient and insurance preference. Use up to four times daily as directed. (FOR ICD-9 250.00, 250.01). 1 each 0   hydrochlorothiazide (HYDRODIURIL) 12.5 MG tablet Take 1 tablet (12.5 mg total) by mouth daily. 30 tablet 0   insulin glargine (LANTUS SOLOSTAR) 100 UNIT/ML Solostar Pen Inject 40 Units into the skin daily. 12 mL 0   insulin lispro (HUMALOG) 100 UNIT/ML KwikPen Inject 5 Units into the skin 3 (three) times daily. 15 mL 0   lisinopril (ZESTRIL) 20 MG tablet Take 1 tablet (20 mg total) by mouth daily. 30 tablet 0   metFORMIN (GLUCOPHAGE) 850 MG tablet Take 1 tablet (850 mg total) by mouth 2 (two) times daily with a meal. 60 tablet 0   metoprolol tartrate (LOPRESSOR) 25 MG tablet Take 1 tablet (25 mg total) by mouth 2 (two)  times daily. 60 tablet 0   sulfamethoxazole-trimethoprim (BACTRIM DS) 800-160 MG tablet Take 1 tablet by mouth every 12 (twelve) hours for 7 days. 14 tablet 0   zinc oxide 20 % ointment Apply topically 2 (two) times daily for 5 days. 56.7 g 0   No current facility-administered medications on file prior to visit.    No Known Allergies   Review of Systems A comprehensive review of systems was negative except for: Gastrointestinal: positive for diarrhea Musculoskeletal: positive for muscle weakness and swelling in lower extremities. Using walker for assistance with ambulation.   Objective:   BP (!) 170/75    Pulse (!) 57    Resp 16    Ht 5' 7"  (1.702 m)     Wt 227 lb 0.8 oz (103 kg)    BMI 35.56 kg/m  Body mass index is 35.56 kg/m.  General:  alert and no distress  Abdomen: soft, bowel sounds active, non-tender  Incision:   healing well, no erythema, no hernia, no swelling.  Seroma present in left mons pubis. Left labia minora with small amount of serosanguinous drainage present from incision. Suture material present (prior drain in place but dislodged at discharge)  Extremities:  Mild swelling at ankles, no edema present, non-tender.     Pathology:  None   Labs:  Component 7 d ago  Specimen Description VULVA  Performed at [facility], [address] [place], [place] [address]   Special Requests NONE  Performed at [facility], [address] [place], [place] [address]   Gram Stain FEW WBC PRESENT, PREDOMINANTLY MONONUCLEAR  FEW GRAM NEGATIVE RODS  FEW GRAM POSITIVE COCCI IN PAIRS   Culture RARE ESCHERICHIA COLI  FEW STAPHYLOCOCCUS EPIDERMIDIS  MODERATE STREPTOCOCCUS GROUP F  Beta hemolytic streptococci are predictably susceptible to penicillin and other beta lactams. Susceptibility testing not routinely performed.  MODERATE PREVOTELLA BIVIA  BETA LACTAMASE POSITIVE  Performed at [facility], [place] [address], [place],  [address]   Report Status 05/14/2021 FINAL   Organism ID, Bacteria ESCHERICHIA COLI   Organism ID, Bacteria STAPHYLOCOCCUS EPIDERMIDIS     Lab Results  Component Value Date   WBC 12.6 (H) 05/14/2021   HGB 11.0 (L) 05/14/2021   HCT 34.1 (L) 05/14/2021   MCV 84.0 05/14/2021   PLT 309 05/14/2021    Assessment:   Patient with right vulvar abscess, s/p I&D History of sepsis HTN DM   Plan:   1. Continue any current medications as instructed by provider.  Will prescribe Ultram and lidocaine gel for pain management.  2. Wound care discussed. Removed drain sutures. If no further drainage by next week, can consider closure of the wound. 3.  Activity restrictions:  pelvic  rest for 1-2 weeks.  5. HTN and DM, encouraged compliance with medications. 6. Follow up: 1 week for reassessment of wound.    Rubie Maid, MD [facility]

## 2021-05-18 ENCOUNTER — Encounter: Payer: Self-pay | Admitting: Obstetrics and Gynecology

## 2021-05-18 ENCOUNTER — Other Ambulatory Visit: Payer: Self-pay

## 2021-05-18 ENCOUNTER — Ambulatory Visit (INDEPENDENT_AMBULATORY_CARE_PROVIDER_SITE_OTHER): Payer: Medicaid Other | Admitting: Obstetrics and Gynecology

## 2021-05-18 VITALS — BP 170/75 | HR 57 | Resp 16 | Ht 67.0 in | Wt 227.1 lb

## 2021-05-18 DIAGNOSIS — N764 Abscess of vulva: Secondary | ICD-10-CM

## 2021-05-18 DIAGNOSIS — E119 Type 2 diabetes mellitus without complications: Secondary | ICD-10-CM

## 2021-05-18 DIAGNOSIS — I10 Essential (primary) hypertension: Secondary | ICD-10-CM

## 2021-05-18 DIAGNOSIS — Z8619 Personal history of other infectious and parasitic diseases: Secondary | ICD-10-CM

## 2021-05-18 MED ORDER — TRAMADOL HCL 50 MG PO TABS: 50.0000 mg | ORAL_TABLET | Freq: Four times a day (QID) | ORAL | 0 refills | Status: DC | PRN

## 2021-05-18 MED ORDER — LIDOCAINE HCL URETHRAL/MUCOSAL 2 % EX GEL: 1.0000 "application " | Freq: Three times a day (TID) | CUTANEOUS | 1 refills | Status: DC | PRN

## 2021-05-19 ENCOUNTER — Telehealth: Payer: Self-pay | Admitting: Obstetrics and Gynecology

## 2021-05-19 NOTE — Telephone Encounter (Signed)
Pharmacy called and stated that RX sent for lidocaine 2% and is wholesale and is not stocked. Consulted Dr. Marcelline Mates via phone who approved 1% lidocaine OTC Verbally. Relayed RX verbal to pharmacy tech. Accepted.

## 2021-05-25 ENCOUNTER — Ambulatory Visit (INDEPENDENT_AMBULATORY_CARE_PROVIDER_SITE_OTHER): Payer: Medicaid Other | Admitting: Obstetrics and Gynecology

## 2021-05-25 ENCOUNTER — Other Ambulatory Visit: Payer: Self-pay

## 2021-05-25 ENCOUNTER — Encounter: Payer: Self-pay | Admitting: Obstetrics and Gynecology

## 2021-05-25 VITALS — BP 127/54 | HR 60 | Resp 16 | Ht 67.0 in | Wt 225.7 lb

## 2021-05-25 DIAGNOSIS — L7634 Postprocedural seroma of skin and subcutaneous tissue following other procedure: Secondary | ICD-10-CM | POA: Diagnosis not present

## 2021-05-25 DIAGNOSIS — N762 Acute vulvitis: Secondary | ICD-10-CM | POA: Diagnosis not present

## 2021-05-25 NOTE — Progress Notes (Signed)
° ° °  GYNECOLOGY PROGRESS NOTE  Subjective:    Patient ID: [name], female    DOB: 07/12/1980, 41 y.o.   MRN: [id number]  ASL Interpreter present for today's visit.   HPI  Patient is a 41 y.o. No obstetric history on file. female who presents for 1 week f/u of complex vulvar abscess. Is 2 weeks s/p hospitalization. Notes pain is still present but less, feels more like a pinching sensation.  States she was not able to pick up the lidocaine gel from her pharmacy as it was not available. Has completed antibiotic course.   The following portions of the patient's history were reviewed and updated as appropriate: allergies, current medications, past family history, past medical history, past social history, past surgical history, and problem list.  Review of Systems Pertinent items are noted in HPI.   Objective:   Blood pressure (!) 127/54, pulse 60, resp. rate 16, height 5\' 7"  (1.702 m), weight 225 lb 11.2 oz (102.4 kg). Body mass index is 35.35 kg/m. General appearance: alert and no distress Abdomen: soft, non-tender; bowel sounds normal; no masses,  no organomegaly Pelvic: incision site at left mons pubis well healed, no erythema, no hernia, but seroma present (however could also be area of fat necrosis).   Left labia minora incision site healing well,  with no drainage, seroma (vs fat necrosis) also present.   Assessment:   1. Cellulitis of labia majora   2. Postprocedural seroma of skin and subcutaneous tissue following other procedure      Plan:    Cellulitis has resolved, patient completed antibiotic course. Incision sites healing well, no further drainage Seroma vs possible area of fat necrosis. Discussed plan of continuing warm compresses to the area. If no resolution over next several months, may need to consider excision of tissue if bothersome to patient.  Advised on use of topical lidocaine gel for discomfort as needed.   To f/u in 2 months. Patient notes  that she is overdue for routine health maintenance. Will perform at that time. Can also f/u on vulvar region.    Rubie Maid, MD [facility]

## 2021-06-15 ENCOUNTER — Encounter: Payer: Medicaid Other | Admitting: Obstetrics and Gynecology

## 2021-07-23 ENCOUNTER — Encounter: Payer: Medicaid Other | Admitting: Obstetrics and Gynecology

## 2021-08-05 ENCOUNTER — Encounter: Payer: Self-pay | Admitting: Obstetrics and Gynecology

## 2021-08-19 ENCOUNTER — Encounter: Payer: Medicaid Other | Admitting: Obstetrics and Gynecology

## 2021-11-10 ENCOUNTER — Encounter: Payer: Medicaid Other | Admitting: Obstetrics and Gynecology

## 2021-12-07 ENCOUNTER — Emergency Department: Payer: Medicaid Other

## 2021-12-07 ENCOUNTER — Encounter: Payer: Self-pay | Admitting: Emergency Medicine

## 2021-12-07 ENCOUNTER — Emergency Department
Admission: EM | Admit: 2021-12-07 | Discharge: 2021-12-07 | Disposition: A | Discharge status: Home / Self Care | Payer: Medicaid Other | Attending: Emergency Medicine | Admitting: Emergency Medicine

## 2021-12-07 DIAGNOSIS — Z3A12 12 weeks gestation of pregnancy: Secondary | ICD-10-CM | POA: Insufficient documentation

## 2021-12-07 DIAGNOSIS — F410 Panic disorder [episodic paroxysmal anxiety] without agoraphobia: Secondary | ICD-10-CM | POA: Insufficient documentation

## 2021-12-07 DIAGNOSIS — J45909 Unspecified asthma, uncomplicated: Secondary | ICD-10-CM | POA: Insufficient documentation

## 2021-12-07 DIAGNOSIS — O99341 Other mental disorders complicating pregnancy, first trimester: Secondary | ICD-10-CM | POA: Diagnosis present

## 2021-12-07 DIAGNOSIS — E119 Type 2 diabetes mellitus without complications: Secondary | ICD-10-CM | POA: Insufficient documentation

## 2021-12-07 DIAGNOSIS — O99511 Diseases of the respiratory system complicating pregnancy, first trimester: Secondary | ICD-10-CM | POA: Insufficient documentation

## 2021-12-07 DIAGNOSIS — O24311 Unspecified pre-existing diabetes mellitus in pregnancy, first trimester: Secondary | ICD-10-CM | POA: Insufficient documentation

## 2021-12-07 DIAGNOSIS — O10911 Unspecified pre-existing hypertension complicating pregnancy, first trimester: Secondary | ICD-10-CM | POA: Insufficient documentation

## 2021-12-07 DIAGNOSIS — Z3491 Encounter for supervision of normal pregnancy, unspecified, first trimester: Secondary | ICD-10-CM

## 2021-12-07 LAB — BASIC METABOLIC PANEL
Anion gap: 7 (ref 5–15)
BUN: 13 mg/dL (ref 6–20)
CO2: 23 mmol/L (ref 22–32)
Calcium: 8.7 mg/dL — ABNORMAL LOW (ref 8.9–10.3)
Chloride: 107 mmol/L (ref 98–111)
Creatinine, Ser: 0.62 mg/dL (ref 0.44–1.00)
GFR, Estimated: >60 mL/min (ref >60)
Glucose, Bld: 195 mg/dL — ABNORMAL HIGH (ref 70–99)
Potassium: 3.7 mmol/L (ref 3.5–5.1)
Sodium: 137 mmol/L (ref 135–145)

## 2021-12-07 LAB — CBC
HCT: 31.6 % — ABNORMAL LOW (ref 36.0–46.0)
Hemoglobin: 10.2 g/dL — ABNORMAL LOW (ref 12.0–15.0)
MCH: 27.3 pg (ref 26.0–34.0)
MCHC: 32.3 g/dL (ref 30.0–36.0)
MCV: 84.7 fL (ref 80.0–100.0)
Platelets: 203 10*3/uL (ref 150–400)
RBC: 3.73 MIL/uL — ABNORMAL LOW (ref 3.87–5.11)
RDW: 14.3 % (ref 11.5–15.5)
WBC: 10.2 10*3/uL (ref 4.0–10.5)
nRBC: 0 % (ref 0.0–0.2)

## 2021-12-07 LAB — HEMOGLOBIN A1C
Hgb A1c MFr Bld: 8 % — ABNORMAL HIGH (ref 4.8–5.6)
Mean Plasma Glucose: 182.9 mg/dL

## 2021-12-07 LAB — TROPONIN I (HIGH SENSITIVITY)
Troponin I (High Sensitivity): 10 ng/L (ref <18)
Troponin I (High Sensitivity): 11 ng/L (ref <18)

## 2021-12-07 LAB — CBG MONITORING, ED: Glucose-Capillary: 148 mg/dL — ABNORMAL HIGH (ref 70–99)

## 2021-12-07 LAB — HCG, QUANTITATIVE, PREGNANCY: hCG, Beta Chain, Quant, S: 24383 m[IU]/mL — ABNORMAL HIGH (ref <5)

## 2021-12-07 MED ORDER — METOPROLOL TARTRATE 25 MG PO TABS
50.0000 mg | ORAL_TABLET | Freq: Two times a day (BID) | ORAL | 0 refills | Status: DC
Start: 2021-12-07 — End: 2022-01-04

## 2021-12-07 MED ORDER — METOPROLOL TARTRATE 50 MG PO TABS
50.0000 mg | ORAL_TABLET | Freq: Two times a day (BID) | ORAL | Status: DC
  Administered 2021-12-07: 50 mg via ORAL
  Filled 2021-12-07: qty 1

## 2021-12-07 MED ORDER — INSULIN ASPART 100 UNIT/ML IJ SOLN
0.0000 [IU] | Freq: Three times a day (TID) | INTRAMUSCULAR | Status: DC
  Administered 2021-12-07: 2 [IU] via SUBCUTANEOUS
  Filled 2021-12-07: qty 1

## 2021-12-07 MED ORDER — METFORMIN HCL 850 MG PO TABS
850.0000 mg | ORAL_TABLET | Freq: Two times a day (BID) | ORAL | Status: DC
  Administered 2021-12-07: 850 mg via ORAL
  Filled 2021-12-07: qty 1

## 2021-12-07 NOTE — ED Notes (Signed)
Pt back from US
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
## 2021-12-07 NOTE — ED Notes (Signed)
Pt given breakfast.

## 2021-12-07 NOTE — ED Notes (Signed)
Verified correct patient and correct discharge papers given. Pt alert and oriented X 4, stable for discharge. RR even and unlabored, color WNL. Discussed discharge instructions and follow-up as directed. Discharge medications discussed, when prescribed. Pt had opportunity to ask questions, and RN available to provide patient and/or family education. Left in taxi with all of belongings.

## 2021-12-07 NOTE — ED Notes (Signed)
Patient transported to Ultrasound 

## 2021-12-07 NOTE — ED Notes (Signed)
CM RN Daniel Nones to visit with patient. Gave resources. Set up transportation home. Pt feels safe to go back home with given resources. See CM RN note.
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
## 2021-12-07 NOTE — ED Notes (Signed)
Pt to US.
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
## 2021-12-07 NOTE — ED Notes (Signed)
Confirmed with patient that she is awaiting social work assistance and at home medications via [place].

## 2021-12-07 NOTE — ED Notes (Addendum)
Pt requesting ED staff rid of her Lipitor and Lisinopril medications. Meds documented on pharmacy disposal sheet, sealed and given to pharmacy for proper disposal.
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
## 2021-12-07 NOTE — Discharge Instructions (Addendum)
STOP lisinopril. Stop atorvastatin. This can cause problems with your pregnancy.  Please follow-up closely with OB/GYN, and also with cardiology and your primary care doctor.

## 2021-12-07 NOTE — ED Notes (Signed)
This RN discussed with the patient a safe D/C home. Pt states that her husband has already gone to work and will not be able to pick her up and the family members watching her son don't have a vehicle to pick her up.

## 2021-12-07 NOTE — ED Notes (Signed)
Pt provided a sandwich tray and water.  

## 2021-12-09 ENCOUNTER — Other Ambulatory Visit: Payer: Medicaid Other

## 2021-12-09 ENCOUNTER — Ambulatory Visit (INDEPENDENT_AMBULATORY_CARE_PROVIDER_SITE_OTHER): Payer: Medicaid Other | Admitting: Obstetrics and Gynecology

## 2021-12-09 ENCOUNTER — Other Ambulatory Visit: Payer: Self-pay | Admitting: Obstetrics and Gynecology

## 2021-12-09 DIAGNOSIS — Z3401 Encounter for supervision of normal first pregnancy, first trimester: Secondary | ICD-10-CM

## 2021-12-09 DIAGNOSIS — Z3A14 14 weeks gestation of pregnancy: Secondary | ICD-10-CM

## 2021-12-09 DIAGNOSIS — O99210 Obesity complicating pregnancy, unspecified trimester: Secondary | ICD-10-CM

## 2021-12-09 DIAGNOSIS — Z113 Encounter for screening for infections with a predominantly sexual mode of transmission: Secondary | ICD-10-CM

## 2021-12-09 MED ORDER — PRENATAL ADULT GUMMY/DHA/FA 0.4-25 MG PO CHEW: 1.0000 | CHEWABLE_TABLET | Freq: Every day | ORAL | 3 refills | Status: AC

## 2021-12-09 NOTE — Progress Notes (Signed)
Pt presents for pregnancy confirmation . G1. Pt states she is not currently taking PNV but will start soon, patient requested PNV be sent to updated pharmacy ([facility]), Rx sent. EDD 06/17/22 based on [place]. [redacted]w[redacted]d Pt to schedule f/u nurse visit, labs, etc. with front desk at checkout. Maternal genetic screening drawn today. Safe meds during pregnancy list given. Pt has some concerns for paternal testing and would like to discuss with provider at [facility] Intake. All questions answered.

## 2021-12-10 LAB — RUBELLA SCREEN: Rubella Antibodies, IGG: 1.74 index (ref >0.99)

## 2021-12-10 LAB — VIRAL HEPATITIS HBV, HCV
HCV Ab: NONREACTIVE
Hep B Core Total Ab: NEGATIVE
Hep B Surface Ab, Qual: NONREACTIVE
Hepatitis B Surface Ag: NEGATIVE

## 2021-12-10 LAB — URINALYSIS, ROUTINE W REFLEX MICROSCOPIC
Bilirubin, UA: NEGATIVE
Glucose, UA: NEGATIVE
Ketones, UA: NEGATIVE
Leukocytes,UA: NEGATIVE
Nitrite, UA: NEGATIVE
Specific Gravity, UA: 1.025 (ref 1.005–1.030)
Urobilinogen, Ur: 1 mg/dL (ref 0.2–1.0)
pH, UA: 7 (ref 5.0–7.5)

## 2021-12-10 LAB — HCV INTERPRETATION

## 2021-12-10 LAB — MICROSCOPIC EXAMINATION
Casts: NONE SEEN /lpf
Epithelial Cells (non renal): >10 /hpf — AB (ref 0–10)

## 2021-12-10 LAB — ANTIBODY SCREEN: Antibody Screen: NEGATIVE

## 2021-12-10 LAB — HEMOGLOBIN A1C
Est. average glucose Bld gHb Est-mCnc: 194 mg/dL
Hgb A1c MFr Bld: 8.4 % — ABNORMAL HIGH (ref 4.8–5.6)

## 2021-12-10 LAB — HIV ANTIBODY (ROUTINE TESTING W REFLEX): HIV Screen 4th Generation wRfx: NONREACTIVE

## 2021-12-10 LAB — RPR: RPR Ser Ql: NONREACTIVE

## 2021-12-10 LAB — ABO AND RH: Rh Factor: POSITIVE

## 2021-12-10 LAB — TSH: TSH: 2.08 u[IU]/mL (ref 0.450–4.500)

## 2021-12-10 LAB — HGB SOLU + RFLX FRAC: Sickle Solubility Test - HGBRFX: NEGATIVE

## 2021-12-10 LAB — PARVOVIRUS B19 ANTIBODY, IGG AND IGM
Parvovirus B19 IgG: 6.5 index — ABNORMAL HIGH (ref 0.0–0.8)
Parvovirus B19 IgM: 0.1 index (ref 0.0–0.8)

## 2021-12-10 LAB — VARICELLA ZOSTER ANTIBODY, IGG: Varicella zoster IgG: 1856 index (ref >165)

## 2021-12-11 LAB — PAIN MGT SCRN (14 DRUGS), UR
Amphetamine Scrn, Ur: NEGATIVE ng/mL
BARBITURATE SCREEN URINE: POSITIVE ng/mL — AB
BENZODIAZEPINE SCREEN, URINE: NEGATIVE ng/mL
Buprenorphine, Urine: NEGATIVE ng/mL
CANNABINOIDS UR QL SCN: POSITIVE ng/mL — AB
Cocaine (Metab) Scrn, Ur: NEGATIVE ng/mL
Creatinine(Crt), U: 116.8 mg/dL (ref 20.0–300.0)
Fentanyl, Urine: NEGATIVE pg/mL
Meperidine Screen, Urine: NEGATIVE ng/mL
Methadone Screen, Urine: NEGATIVE ng/mL
OXYCODONE+OXYMORPHONE UR QL SCN: NEGATIVE ng/mL
Opiate Scrn, Ur: NEGATIVE ng/mL
Ph of Urine: 6.7 (ref 4.5–8.9)
Phencyclidine Qn, Ur: NEGATIVE ng/mL
Propoxyphene Scrn, Ur: NEGATIVE ng/mL
Tramadol Screen, Urine: NEGATIVE ng/mL

## 2021-12-11 LAB — NICOTINE SCREEN, URINE: Cotinine Ql Scrn, Ur: NEGATIVE ng/mL

## 2021-12-11 LAB — URINE CULTURE, OB REFLEX

## 2021-12-11 LAB — CULTURE, OB URINE

## 2021-12-12 LAB — GC/CHLAMYDIA PROBE AMP
Chlamydia trachomatis, NAA: NEGATIVE
Neisseria Gonorrhoeae by PCR: NEGATIVE

## 2021-12-13 ENCOUNTER — Other Ambulatory Visit: Payer: Self-pay

## 2021-12-13 DIAGNOSIS — Z3401 Encounter for supervision of normal first pregnancy, first trimester: Secondary | ICD-10-CM

## 2021-12-16 ENCOUNTER — Telehealth: Payer: Self-pay

## 2021-12-16 NOTE — Telephone Encounter (Signed)
[facility] called with results of [employer] results will be sent over within the hour.  HIGH RISK Patient has a low fetal fraction. 1/17 chance for Trisomy 13, 18, and Triploidy. She has an increased chance of loss of pregnancy.

## 2021-12-24 ENCOUNTER — Encounter: Payer: Medicaid Other | Admitting: Obstetrics and Gynecology

## 2021-12-24 NOTE — Telephone Encounter (Signed)
Let's make sure this patient gets scheduled for sometime next week.

## 2021-12-29 ENCOUNTER — Encounter: Payer: Medicaid Other | Admitting: Obstetrics and Gynecology

## 2022-01-04 ENCOUNTER — Encounter: Payer: Self-pay | Admitting: Obstetrics and Gynecology

## 2022-01-04 ENCOUNTER — Ambulatory Visit (INDEPENDENT_AMBULATORY_CARE_PROVIDER_SITE_OTHER): Payer: Medicaid Other | Admitting: Obstetrics and Gynecology

## 2022-01-04 ENCOUNTER — Other Ambulatory Visit (HOSPITAL_COMMUNITY)
Admission: RE | Admit: 2022-01-04 | Discharge: 2022-01-04 | Disposition: A | Discharge status: Home / Self Care | Payer: Medicaid Other | Source: Ambulatory Visit | Attending: Obstetrics and Gynecology | Admitting: Obstetrics and Gynecology

## 2022-01-04 VITALS — BP 188/64 | HR 62 | Ht 67.0 in | Wt 237.1 lb

## 2022-01-04 DIAGNOSIS — Z3A16 16 weeks gestation of pregnancy: Secondary | ICD-10-CM | POA: Insufficient documentation

## 2022-01-04 DIAGNOSIS — E119 Type 2 diabetes mellitus without complications: Secondary | ICD-10-CM

## 2022-01-04 DIAGNOSIS — O99512 Diseases of the respiratory system complicating pregnancy, second trimester: Secondary | ICD-10-CM | POA: Diagnosis not present

## 2022-01-04 DIAGNOSIS — O09899 Supervision of other high risk pregnancies, unspecified trimester: Secondary | ICD-10-CM

## 2022-01-04 DIAGNOSIS — O99012 Anemia complicating pregnancy, second trimester: Secondary | ICD-10-CM | POA: Diagnosis not present

## 2022-01-04 DIAGNOSIS — Z3482 Encounter for supervision of other normal pregnancy, second trimester: Secondary | ICD-10-CM | POA: Diagnosis present

## 2022-01-04 DIAGNOSIS — D563 Thalassemia minor: Secondary | ICD-10-CM | POA: Diagnosis not present

## 2022-01-04 DIAGNOSIS — O34219 Maternal care for unspecified type scar from previous cesarean delivery: Secondary | ICD-10-CM

## 2022-01-04 DIAGNOSIS — O10919 Unspecified pre-existing hypertension complicating pregnancy, unspecified trimester: Secondary | ICD-10-CM | POA: Insufficient documentation

## 2022-01-04 DIAGNOSIS — N898 Other specified noninflammatory disorders of vagina: Secondary | ICD-10-CM

## 2022-01-04 DIAGNOSIS — O99342 Other mental disorders complicating pregnancy, second trimester: Secondary | ICD-10-CM

## 2022-01-04 DIAGNOSIS — J4520 Mild intermittent asthma, uncomplicated: Secondary | ICD-10-CM

## 2022-01-04 DIAGNOSIS — O10012 Pre-existing essential hypertension complicating pregnancy, second trimester: Secondary | ICD-10-CM

## 2022-01-04 DIAGNOSIS — F419 Anxiety disorder, unspecified: Secondary | ICD-10-CM

## 2022-01-04 DIAGNOSIS — Z87891 Personal history of nicotine dependence: Secondary | ICD-10-CM

## 2022-01-04 DIAGNOSIS — H9193 Unspecified hearing loss, bilateral: Secondary | ICD-10-CM

## 2022-01-04 DIAGNOSIS — O09522 Supervision of elderly multigravida, second trimester: Secondary | ICD-10-CM | POA: Insufficient documentation

## 2022-01-04 DIAGNOSIS — O0992 Supervision of high risk pregnancy, unspecified, second trimester: Secondary | ICD-10-CM | POA: Insufficient documentation

## 2022-01-04 DIAGNOSIS — O99891 Other specified diseases and conditions complicating pregnancy: Secondary | ICD-10-CM

## 2022-01-04 DIAGNOSIS — Z658 Other specified problems related to psychosocial circumstances: Secondary | ICD-10-CM

## 2022-01-04 MED ORDER — LANTUS SOLOSTAR 100 UNIT/ML ~~LOC~~ SOPN: 40.0000 [IU] | PEN_INJECTOR | Freq: Every day | SUBCUTANEOUS | 2 refills | Status: DC

## 2022-01-04 MED ORDER — INSULIN LISPRO (1 UNIT DIAL) 100 UNIT/ML (KWIKPEN): 7.0000 [IU] | PEN_INJECTOR | Freq: Three times a day (TID) | SUBCUTANEOUS | 3 refills | Status: DC

## 2022-01-04 MED ORDER — FREESTYLE LIBRE 2 SENSOR MISC: 2.0000 | 1 refills | Status: DC

## 2022-01-04 MED ORDER — NIFEDIPINE ER OSMOTIC RELEASE 30 MG PO TB24: 30.0000 mg | ORAL_TABLET | Freq: Every day | ORAL | 3 refills | Status: DC

## 2022-01-04 MED ORDER — METFORMIN HCL 850 MG PO TABS: 850.0000 mg | ORAL_TABLET | Freq: Two times a day (BID) | ORAL | 3 refills | Status: DC

## 2022-01-04 MED ORDER — ASPIRIN 81 MG PO TBEC: 81.0000 mg | DELAYED_RELEASE_TABLET | Freq: Every day | ORAL | 2 refills | Status: DC

## 2022-01-04 MED ORDER — FREESTYLE LIBRE 2 READER DEVI: 1.0000 | Freq: Four times a day (QID) | 0 refills | Status: DC
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
## 2022-01-04 NOTE — Progress Notes (Signed)
ROB: She is doing well. She has concerns about having a vaginal infections. She has vaginal odor and a lesion.

## 2022-01-05 ENCOUNTER — Other Ambulatory Visit: Payer: Self-pay

## 2022-01-05 DIAGNOSIS — Z3A16 16 weeks gestation of pregnancy: Secondary | ICD-10-CM

## 2022-01-05 DIAGNOSIS — O0992 Supervision of high risk pregnancy, unspecified, second trimester: Secondary | ICD-10-CM

## 2022-01-05 DIAGNOSIS — D563 Thalassemia minor: Secondary | ICD-10-CM | POA: Insufficient documentation

## 2022-01-05 DIAGNOSIS — Z1379 Encounter for other screening for genetic and chromosomal anomalies: Secondary | ICD-10-CM

## 2022-01-05 LAB — PANORAMA PRENATAL TEST FULL PANEL:PANORAMA TEST PLUS 5 ADDITIONAL MICRODELETIONS
FETAL FRACTION: 1.6
REPORT SUMMARY: HIGH — AB
TRIPLOIDY 13 18 RESULT TEXT: HIGH — AB

## 2022-01-06 LAB — CERVICOVAGINAL ANCILLARY ONLY
Bacterial Vaginitis (gardnerella): POSITIVE — AB
Candida Glabrata: NEGATIVE
Candida Vaginitis: NEGATIVE
Chlamydia: NEGATIVE
Comment: NEGATIVE
Comment: NEGATIVE
Comment: NEGATIVE
Comment: NEGATIVE
Comment: NEGATIVE
Comment: NORMAL
Neisseria Gonorrhea: NEGATIVE
Trichomonas: NEGATIVE

## 2022-01-07 ENCOUNTER — Telehealth: Payer: Self-pay | Admitting: Obstetrics and Gynecology

## 2022-01-07 ENCOUNTER — Other Ambulatory Visit: Payer: Self-pay | Admitting: Obstetrics and Gynecology

## 2022-01-07 DIAGNOSIS — B9689 Other specified bacterial agents as the cause of diseases classified elsewhere: Secondary | ICD-10-CM

## 2022-01-07 MED ORDER — METRONIDAZOLE 500 MG PO TABS: 500.0000 mg | ORAL_TABLET | Freq: Two times a day (BID) | ORAL | 0 refills | Status: DC

## 2022-01-07 NOTE — Telephone Encounter (Signed)
[facility] [phone] calling about patient and her diabetes. [facility] is wanting to get her set up with endocrinologist and was wondering if Dr Marcelline Mates would be following up with patient or do they need to proceed with the referral.

## 2022-01-07 NOTE — Telephone Encounter (Signed)
Spoke to [employer] at [facility]. I informed her that Dr. Marcelline Mates would be managing patients DM while she is pregnant. I also explained to her that I was trying to get her a [employer], she said that she would find a code that would get the device approved and call me back. She also suggest that we refer patient for Diabetes Education, because she has been very non-compliant with her diabetes.

## 2022-01-08 LAB — CYTOLOGY - PAP
Comment: NEGATIVE
Diagnosis: NEGATIVE
High risk HPV: NEGATIVE

## 2022-01-11 ENCOUNTER — Encounter: Payer: Medicaid Other | Admitting: Obstetrics

## 2022-01-19 ENCOUNTER — Encounter: Payer: Self-pay | Admitting: Obstetrics and Gynecology

## 2022-01-28 ENCOUNTER — Ambulatory Visit (INDEPENDENT_AMBULATORY_CARE_PROVIDER_SITE_OTHER): Payer: Medicaid Other | Admitting: Obstetrics and Gynecology

## 2022-01-28 ENCOUNTER — Encounter: Payer: Self-pay | Admitting: Obstetrics and Gynecology

## 2022-01-28 VITALS — BP 149/83 | HR 87 | Wt 228.0 lb

## 2022-01-28 DIAGNOSIS — O99891 Other specified diseases and conditions complicating pregnancy: Secondary | ICD-10-CM | POA: Diagnosis not present

## 2022-01-28 DIAGNOSIS — O09522 Supervision of elderly multigravida, second trimester: Secondary | ICD-10-CM

## 2022-01-28 DIAGNOSIS — O10912 Unspecified pre-existing hypertension complicating pregnancy, second trimester: Secondary | ICD-10-CM

## 2022-01-28 DIAGNOSIS — O0992 Supervision of high risk pregnancy, unspecified, second trimester: Secondary | ICD-10-CM

## 2022-01-28 DIAGNOSIS — Z87891 Personal history of nicotine dependence: Secondary | ICD-10-CM

## 2022-01-28 DIAGNOSIS — O10012 Pre-existing essential hypertension complicating pregnancy, second trimester: Secondary | ICD-10-CM | POA: Diagnosis not present

## 2022-01-28 DIAGNOSIS — Z3A20 20 weeks gestation of pregnancy: Secondary | ICD-10-CM

## 2022-01-28 DIAGNOSIS — Z1379 Encounter for other screening for genetic and chromosomal anomalies: Secondary | ICD-10-CM

## 2022-01-28 DIAGNOSIS — F419 Anxiety disorder, unspecified: Secondary | ICD-10-CM

## 2022-01-28 DIAGNOSIS — H9193 Unspecified hearing loss, bilateral: Secondary | ICD-10-CM

## 2022-01-28 DIAGNOSIS — Z23 Encounter for immunization: Secondary | ICD-10-CM

## 2022-01-28 DIAGNOSIS — T7491XD Unspecified adult maltreatment, confirmed, subsequent encounter: Secondary | ICD-10-CM

## 2022-01-28 DIAGNOSIS — E119 Type 2 diabetes mellitus without complications: Secondary | ICD-10-CM

## 2022-01-28 DIAGNOSIS — O10919 Unspecified pre-existing hypertension complicating pregnancy, unspecified trimester: Secondary | ICD-10-CM

## 2022-01-28 LAB — POCT URINALYSIS DIPSTICK OB
Bilirubin, UA: NEGATIVE
Ketones, UA: NEGATIVE
Leukocytes, UA: NEGATIVE
Nitrite, UA: NEGATIVE
Spec Grav, UA: 1.02 (ref 1.010–1.025)
Urobilinogen, UA: 0.2 E.U./dL
pH, UA: 6 (ref 5.0–8.0)
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
## 2022-01-28 NOTE — Progress Notes (Unsigned)
ROB.Patient states no questions or concerns at this time.

## 2022-01-29 DIAGNOSIS — T7491XA Unspecified adult maltreatment, confirmed, initial encounter: Secondary | ICD-10-CM | POA: Insufficient documentation

## 2022-01-29 MED ORDER — DEXCOM G6 TRANSMITTER MISC: 0 refills | Status: DC

## 2022-01-29 MED ORDER — DEXCOM G6 RECEIVER DEVI: 0 refills | Status: DC

## 2022-01-29 MED ORDER — DEXCOM G6 SENSOR MISC: 6 refills | Status: DC

## 2022-01-30 LAB — AFP, SERUM, OPEN SPINA BIFIDA
AFP MoM: 1.27
AFP Value: 49.8 ng/mL
Gest. Age on Collection Date: 20 weeks
Maternal Age At EDD: 40.3 yr
OSBR Risk 1 IN: 3135
Test Results:: NEGATIVE
Weight: 228 [lb_av]

## 2022-02-05 ENCOUNTER — Encounter (HOSPITAL_COMMUNITY): Payer: Self-pay | Admitting: Obstetrics & Gynecology

## 2022-02-05 ENCOUNTER — Inpatient Hospital Stay (HOSPITAL_COMMUNITY)
Admission: AD | Admit: 2022-02-05 | Discharge: 2022-02-05 | Disposition: A | Discharge status: Home / Self Care | Payer: Medicaid Other | Attending: Obstetrics & Gynecology | Admitting: Obstetrics & Gynecology

## 2022-02-05 DIAGNOSIS — R519 Headache, unspecified: Secondary | ICD-10-CM | POA: Diagnosis not present

## 2022-02-05 DIAGNOSIS — R109 Unspecified abdominal pain: Secondary | ICD-10-CM | POA: Diagnosis not present

## 2022-02-05 DIAGNOSIS — Z789 Other specified health status: Secondary | ICD-10-CM | POA: Diagnosis not present

## 2022-02-05 DIAGNOSIS — O26892 Other specified pregnancy related conditions, second trimester: Secondary | ICD-10-CM

## 2022-02-05 DIAGNOSIS — Z3A21 21 weeks gestation of pregnancy: Secondary | ICD-10-CM | POA: Insufficient documentation

## 2022-02-05 DIAGNOSIS — H9190 Unspecified hearing loss, unspecified ear: Secondary | ICD-10-CM | POA: Insufficient documentation

## 2022-02-05 DIAGNOSIS — T7491XA Unspecified adult maltreatment, confirmed, initial encounter: Secondary | ICD-10-CM | POA: Insufficient documentation

## 2022-02-05 LAB — WET PREP, GENITAL
Clue Cells Wet Prep HPF POC: NONE SEEN
Sperm: NONE SEEN
Trich, Wet Prep: NONE SEEN
WBC, Wet Prep HPF POC: <10 (ref <10)
Yeast Wet Prep HPF POC: NONE SEEN

## 2022-02-05 LAB — URINALYSIS, ROUTINE W REFLEX MICROSCOPIC
Bilirubin Urine: NEGATIVE
Glucose, UA: 150 mg/dL — AB
Hgb urine dipstick: NEGATIVE
Ketones, ur: NEGATIVE mg/dL
Leukocytes,Ua: NEGATIVE
Nitrite: NEGATIVE
Protein, ur: >=300 mg/dL — AB
Specific Gravity, Urine: 1.024 (ref 1.005–1.030)
pH: 5 (ref 5.0–8.0)

## 2022-02-05 MED ORDER — ACETAMINOPHEN-CAFFEINE 500-65 MG PO TABS
2.0000 | ORAL_TABLET | Freq: Once | ORAL | Status: AC
  Administered 2022-02-05: 2 via ORAL
  Filled 2022-02-05: qty 2

## 2022-02-05 NOTE — MAU Note (Signed)
Cab voucher given to patient and went home via cab.
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
## 2022-02-07 LAB — GC/CHLAMYDIA PROBE AMP (~~LOC~~) NOT AT ARMC
Chlamydia: NEGATIVE
Comment: NEGATIVE
Comment: NORMAL
Neisseria Gonorrhea: NEGATIVE

## 2022-02-09 ENCOUNTER — Encounter: Payer: Self-pay | Admitting: Obstetrics and Gynecology

## 2022-02-09 ENCOUNTER — Telehealth: Payer: Self-pay

## 2022-02-09 LAB — PANORAMA PRENATAL TEST FULL PANEL:PANORAMA TEST PLUS 5 ADDITIONAL MICRODELETIONS
FETAL FRACTION: 1.6
REPORT SUMMARY: HIGH — AB
TRIPLOIDY 13 18 RESULT TEXT: HIGH — AB

## 2022-02-09 NOTE — Telephone Encounter (Signed)
Ok thank you.  She is scheduled to see MFM tomorrow for her other comorbidities so they can address next steps as far as testing goes.

## 2022-02-09 NOTE — Telephone Encounter (Signed)
[name], Dietitian from [place], Immunologist; pt's redraw panorama had same result as the first - high risk d/t decreased fetak fraction; still pretty low considering GA; 1:17risk for triploidy, trisomy 39 and 55; fetal fraction statistical analysis - there wasn't enough cells to run; a pt with this type of results is at high risk for pregnancy los and if the happens they will offer free testing on the placenta for POC testing - Jane Schultz.  Recommends genetic counseling, U/S and diagnostic testing.  A third sample probably won't have better results since these are the same as the first sample.  Report will be sent today.  [phone] and ask for a genetic counselor if you have questions.

## 2022-02-10 ENCOUNTER — Ambulatory Visit: Payer: Self-pay | Admitting: Obstetrics and Gynecology

## 2022-02-10 ENCOUNTER — Encounter: Payer: Self-pay | Admitting: Obstetrics and Gynecology

## 2022-02-10 ENCOUNTER — Other Ambulatory Visit: Payer: Self-pay

## 2022-02-10 ENCOUNTER — Ambulatory Visit: Payer: Medicaid Other | Attending: Obstetrics and Gynecology

## 2022-02-10 ENCOUNTER — Ambulatory Visit (HOSPITAL_BASED_OUTPATIENT_CLINIC_OR_DEPARTMENT_OTHER): Payer: Medicaid Other

## 2022-02-10 ENCOUNTER — Inpatient Hospital Stay
Admission: RE | Admit: 2022-02-10 | Discharge: 2022-02-13 | DRG: 776 | Disposition: A | Discharge status: Home / Self Care | Payer: Medicaid Other | Attending: Obstetrics | Admitting: Obstetrics

## 2022-02-10 DIAGNOSIS — O3510X0 Maternal care for (suspected) chromosomal abnormality in fetus, unspecified, not applicable or unspecified: Secondary | ICD-10-CM

## 2022-02-10 DIAGNOSIS — O0992 Supervision of high risk pregnancy, unspecified, second trimester: Secondary | ICD-10-CM

## 2022-02-10 DIAGNOSIS — Z3A22 22 weeks gestation of pregnancy: Secondary | ICD-10-CM

## 2022-02-10 DIAGNOSIS — O09522 Supervision of elderly multigravida, second trimester: Secondary | ICD-10-CM | POA: Insufficient documentation

## 2022-02-10 DIAGNOSIS — O09512 Supervision of elderly primigravida, second trimester: Secondary | ICD-10-CM

## 2022-02-10 DIAGNOSIS — O24112 Pre-existing diabetes mellitus, type 2, in pregnancy, second trimester: Secondary | ICD-10-CM | POA: Diagnosis not present

## 2022-02-10 DIAGNOSIS — O09292 Supervision of pregnancy with other poor reproductive or obstetric history, second trimester: Secondary | ICD-10-CM | POA: Insufficient documentation

## 2022-02-10 DIAGNOSIS — O1003 Pre-existing essential hypertension complicating the puerperium: Principal | ICD-10-CM | POA: Diagnosis present

## 2022-02-10 DIAGNOSIS — Z363 Encounter for antenatal screening for malformations: Secondary | ICD-10-CM | POA: Diagnosis not present

## 2022-02-10 DIAGNOSIS — O99212 Obesity complicating pregnancy, second trimester: Secondary | ICD-10-CM

## 2022-02-10 DIAGNOSIS — O34219 Maternal care for unspecified type scar from previous cesarean delivery: Secondary | ICD-10-CM

## 2022-02-10 DIAGNOSIS — O10919 Unspecified pre-existing hypertension complicating pregnancy, unspecified trimester: Secondary | ICD-10-CM

## 2022-02-10 DIAGNOSIS — E119 Type 2 diabetes mellitus without complications: Secondary | ICD-10-CM | POA: Diagnosis present

## 2022-02-10 DIAGNOSIS — H9193 Unspecified hearing loss, bilateral: Secondary | ICD-10-CM

## 2022-02-10 DIAGNOSIS — D563 Thalassemia minor: Secondary | ICD-10-CM | POA: Diagnosis not present

## 2022-02-10 DIAGNOSIS — O09212 Supervision of pregnancy with history of pre-term labor, second trimester: Secondary | ICD-10-CM

## 2022-02-10 DIAGNOSIS — Z794 Long term (current) use of insulin: Secondary | ICD-10-CM

## 2022-02-10 DIAGNOSIS — O9089 Other complications of the puerperium, not elsewhere classified: Secondary | ICD-10-CM | POA: Diagnosis present

## 2022-02-10 DIAGNOSIS — O285 Abnormal chromosomal and genetic finding on antenatal screening of mother: Secondary | ICD-10-CM

## 2022-02-10 DIAGNOSIS — O10912 Unspecified pre-existing hypertension complicating pregnancy, second trimester: Secondary | ICD-10-CM | POA: Insufficient documentation

## 2022-02-10 DIAGNOSIS — O2413 Pre-existing diabetes mellitus, type 2, in the puerperium: Secondary | ICD-10-CM | POA: Diagnosis present

## 2022-02-10 DIAGNOSIS — Z3A21 21 weeks gestation of pregnancy: Secondary | ICD-10-CM | POA: Diagnosis not present

## 2022-02-10 DIAGNOSIS — O162 Unspecified maternal hypertension, second trimester: Secondary | ICD-10-CM | POA: Diagnosis not present

## 2022-02-10 DIAGNOSIS — O10012 Pre-existing essential hypertension complicating pregnancy, second trimester: Secondary | ICD-10-CM

## 2022-02-10 DIAGNOSIS — E669 Obesity, unspecified: Secondary | ICD-10-CM

## 2022-02-10 DIAGNOSIS — Z7984 Long term (current) use of oral hypoglycemic drugs: Secondary | ICD-10-CM | POA: Diagnosis not present

## 2022-02-10 DIAGNOSIS — N049 Nephrotic syndrome with unspecified morphologic changes: Secondary | ICD-10-CM | POA: Diagnosis present

## 2022-02-10 DIAGNOSIS — O24113 Pre-existing diabetes mellitus, type 2, in pregnancy, third trimester: Secondary | ICD-10-CM | POA: Diagnosis present

## 2022-02-10 DIAGNOSIS — J4520 Mild intermittent asthma, uncomplicated: Secondary | ICD-10-CM

## 2022-02-10 LAB — TYPE AND SCREEN
ABO/RH(D): O POS
Antibody Screen: NEGATIVE

## 2022-02-10 LAB — COMPREHENSIVE METABOLIC PANEL
ALT: 26 U/L (ref 0–44)
AST: 33 U/L (ref 15–41)
Albumin: 2.5 g/dL — ABNORMAL LOW (ref 3.5–5.0)
Alkaline Phosphatase: 71 U/L (ref 38–126)
Anion gap: 6 (ref 5–15)
BUN: 14 mg/dL (ref 6–20)
CO2: 21 mmol/L — ABNORMAL LOW (ref 22–32)
Calcium: 9.1 mg/dL (ref 8.9–10.3)
Chloride: 109 mmol/L (ref 98–111)
Creatinine, Ser: 0.7 mg/dL (ref 0.44–1.00)
GFR, Estimated: >60 mL/min (ref >60)
Glucose, Bld: 122 mg/dL — ABNORMAL HIGH (ref 70–99)
Potassium: 4.2 mmol/L (ref 3.5–5.1)
Sodium: 136 mmol/L (ref 135–145)
Total Bilirubin: 0.6 mg/dL (ref 0.3–1.2)
Total Protein: 6.6 g/dL (ref 6.5–8.1)

## 2022-02-10 LAB — CBC
HCT: 43.7 % (ref 36.0–46.0)
Hemoglobin: 14.6 g/dL (ref 12.0–15.0)
MCH: 27.5 pg (ref 26.0–34.0)
MCHC: 33.4 g/dL (ref 30.0–36.0)
MCV: 82.5 fL (ref 80.0–100.0)
Platelets: 169 10*3/uL (ref 150–400)
RBC: 5.3 MIL/uL — ABNORMAL HIGH (ref 3.87–5.11)
RDW: 13.4 % (ref 11.5–15.5)
WBC: 6.5 10*3/uL (ref 4.0–10.5)
nRBC: 0 % (ref 0.0–0.2)

## 2022-02-10 LAB — PROTEIN / CREATININE RATIO, URINE
Creatinine, Urine: 180 mg/dL
Protein Creatinine Ratio: 6.07 mg/mg{Cre} — ABNORMAL HIGH (ref 0.00–0.15)
Total Protein, Urine: 1092 mg/dL

## 2022-02-10 LAB — GLUCOSE, CAPILLARY: Glucose-Capillary: 138 mg/dL — ABNORMAL HIGH (ref 70–99)

## 2022-02-10 LAB — ABO/RH: ABO/RH(D): O POS

## 2022-02-10 MED ORDER — HYDROXYZINE HCL 25 MG PO TABS: 50.0000 mg | ORAL_TABLET | Freq: Four times a day (QID) | ORAL | Status: DC | PRN

## 2022-02-10 MED ORDER — NIFEDIPINE ER OSMOTIC RELEASE 30 MG PO TB24
60.0000 mg | ORAL_TABLET | Freq: Every day | ORAL | Status: DC
  Administered 2022-02-11: 60 mg via ORAL
  Filled 2022-02-10: qty 2

## 2022-02-10 MED ORDER — ACETAMINOPHEN 500 MG PO TABS
1000.0000 mg | ORAL_TABLET | Freq: Four times a day (QID) | ORAL | Status: DC | PRN
  Administered 2022-02-10 – 2022-02-11 (×2): 1000 mg via ORAL
  Filled 2022-02-10 (×2): qty 2

## 2022-02-10 MED ORDER — HYDRALAZINE HCL 20 MG/ML IJ SOLN: 10.0000 mg | INTRAMUSCULAR | Status: DC | PRN

## 2022-02-10 MED ORDER — LABETALOL HCL 5 MG/ML IV SOLN
20.0000 mg | INTRAVENOUS | Status: DC | PRN
  Administered 2022-02-11 – 2022-02-12 (×4): 20 mg via INTRAVENOUS
  Filled 2022-02-10 (×4): qty 4

## 2022-02-10 MED ORDER — LABETALOL HCL 5 MG/ML IV SOLN
INTRAVENOUS | Status: AC
  Administered 2022-02-10: 20 mg via INTRAVENOUS
  Filled 2022-02-10: qty 4

## 2022-02-10 MED ORDER — LABETALOL HCL 5 MG/ML IV SOLN: 80.0000 mg | INTRAVENOUS | Status: DC | PRN

## 2022-02-10 MED ORDER — INSULIN ASPART 100 UNIT/ML IJ SOLN
0.0000 [IU] | Freq: Three times a day (TID) | INTRAMUSCULAR | Status: DC
  Administered 2022-02-10 – 2022-02-12 (×3): 2 [IU] via SUBCUTANEOUS
  Administered 2022-02-12 – 2022-02-13 (×2): 3 [IU] via SUBCUTANEOUS
  Filled 2022-02-10 (×2): qty 1

## 2022-02-10 MED ORDER — INSULIN GLARGINE-YFGN 100 UNIT/ML ~~LOC~~ SOLN
40.0000 [IU] | Freq: Every day | SUBCUTANEOUS | Status: DC
  Administered 2022-02-10 – 2022-02-12 (×3): 40 [IU] via SUBCUTANEOUS
  Filled 2022-02-10 (×4): qty 0.4

## 2022-02-10 MED ORDER — LABETALOL HCL 5 MG/ML IV SOLN
40.0000 mg | INTRAVENOUS | Status: DC | PRN
  Administered 2022-02-10: 40 mg via INTRAVENOUS
  Filled 2022-02-10: qty 8

## 2022-02-10 MED ORDER — ZOLPIDEM TARTRATE 5 MG PO TABS: 5.0000 mg | ORAL_TABLET | Freq: Every evening | ORAL | Status: DC | PRN

## 2022-02-10 MED ORDER — LACTATED RINGERS IV SOLN: INTRAVENOUS | Status: DC

## 2022-02-10 MED ORDER — INSULIN REGULAR HUMAN 100 UNIT/ML IJ SOLN
7.0000 [IU] | Freq: Three times a day (TID) | INTRAMUSCULAR | Status: DC
  Administered 2022-02-10 – 2022-02-12 (×7): 7 [IU] via SUBCUTANEOUS
  Filled 2022-02-10: qty 0.07
  Filled 2022-02-10: qty 3

## 2022-02-10 MED ORDER — METOPROLOL TARTRATE 25 MG PO TABS
12.5000 mg | ORAL_TABLET | Freq: Every day | ORAL | Status: DC
  Administered 2022-02-11: 12.5 mg via ORAL
  Filled 2022-02-10 (×2): qty 0.5
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
## 2022-02-10 NOTE — Progress Notes (Signed)
BG 122 on CMP. Pt given carb modified diet. Provider aware of P/C ratio- will eval patient.
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
## 2022-02-11 ENCOUNTER — Telehealth: Payer: Self-pay | Admitting: Obstetrics and Gynecology

## 2022-02-11 ENCOUNTER — Encounter: Payer: Medicaid Other | Admitting: Obstetrics and Gynecology

## 2022-02-11 DIAGNOSIS — Z794 Long term (current) use of insulin: Secondary | ICD-10-CM

## 2022-02-11 DIAGNOSIS — E119 Type 2 diabetes mellitus without complications: Secondary | ICD-10-CM

## 2022-02-11 DIAGNOSIS — Z3A22 22 weeks gestation of pregnancy: Secondary | ICD-10-CM

## 2022-02-11 DIAGNOSIS — Z7984 Long term (current) use of oral hypoglycemic drugs: Secondary | ICD-10-CM | POA: Diagnosis not present

## 2022-02-11 DIAGNOSIS — O1003 Pre-existing essential hypertension complicating the puerperium: Secondary | ICD-10-CM | POA: Diagnosis present

## 2022-02-11 DIAGNOSIS — O24112 Pre-existing diabetes mellitus, type 2, in pregnancy, second trimester: Secondary | ICD-10-CM

## 2022-02-11 DIAGNOSIS — O2413 Pre-existing diabetes mellitus, type 2, in the puerperium: Secondary | ICD-10-CM | POA: Diagnosis present

## 2022-02-11 DIAGNOSIS — Z3A21 21 weeks gestation of pregnancy: Secondary | ICD-10-CM

## 2022-02-11 DIAGNOSIS — O10919 Unspecified pre-existing hypertension complicating pregnancy, unspecified trimester: Secondary | ICD-10-CM

## 2022-02-11 DIAGNOSIS — O10012 Pre-existing essential hypertension complicating pregnancy, second trimester: Secondary | ICD-10-CM | POA: Diagnosis not present

## 2022-02-11 DIAGNOSIS — O0992 Supervision of high risk pregnancy, unspecified, second trimester: Secondary | ICD-10-CM

## 2022-02-11 DIAGNOSIS — O9089 Other complications of the puerperium, not elsewhere classified: Secondary | ICD-10-CM | POA: Diagnosis present

## 2022-02-11 DIAGNOSIS — N049 Nephrotic syndrome with unspecified morphologic changes: Secondary | ICD-10-CM | POA: Diagnosis present

## 2022-02-11 LAB — GLUCOSE, CAPILLARY
Glucose-Capillary: 85 mg/dL (ref 70–99)
Glucose-Capillary: 141 mg/dL — ABNORMAL HIGH (ref 70–99)
Glucose-Capillary: 82 mg/dL (ref 70–99)
Glucose-Capillary: 119 mg/dL — ABNORMAL HIGH (ref 70–99)

## 2022-02-11 LAB — HEMOGLOBIN A1C
Hgb A1c MFr Bld: 6.3 % — ABNORMAL HIGH (ref 4.8–5.6)
Mean Plasma Glucose: 134.11 mg/dL

## 2022-02-11 LAB — POCT CBG MONITORING: CBG: 113

## 2022-02-11 MED ORDER — NIFEDIPINE ER OSMOTIC RELEASE 30 MG PO TB24
90.0000 mg | ORAL_TABLET | Freq: Every day | ORAL | Status: DC
  Administered 2022-02-12 – 2022-02-13 (×2): 90 mg via ORAL
  Filled 2022-02-11 (×2): qty 3

## 2022-02-11 MED ORDER — ACETAMINOPHEN-CODEINE 300-30 MG PO TABS
2.0000 | ORAL_TABLET | Freq: Once | ORAL | Status: AC
  Administered 2022-02-11: 2 via ORAL
  Filled 2022-02-11: qty 2

## 2022-02-11 MED ORDER — NIFEDIPINE ER OSMOTIC RELEASE 30 MG PO TB24
30.0000 mg | ORAL_TABLET | Freq: Once | ORAL | Status: AC
  Administered 2022-02-11: 30 mg via ORAL
  Filled 2022-02-11: qty 1

## 2022-02-11 MED ORDER — BUTALBITAL-APAP-CAFFEINE 50-325-40 MG PO TABS
1.0000 | ORAL_TABLET | Freq: Four times a day (QID) | ORAL | Status: DC | PRN
  Administered 2022-02-11 – 2022-02-13 (×5): 1 via ORAL
  Filled 2022-02-11 (×5): qty 1

## 2022-02-11 NOTE — Inpatient Diabetes Management (Signed)
Inpatient Diabetes Program Recommendations  ADA Standards of Care 2023 Diabetes in Pregnancy Target Glucose Ranges:  Fasting: 70 - 95 mg/dL 1 hr postprandial:  110 - '140mg'$ /dL (from first bite of meal) 2 hr postprandial:  100 - 120 mg/dL (from first bit of meal)    Lab Results  Component Value Date   GLUCAP 85 02/11/2022   HGBA1C 8.4 (H) 12/09/2021    Latest Reference Range & Units 02/10/22 20:34 02/11/22 07:43  Glucose-Capillary 70 - 99 mg/dL 138 (H) 85  (H): Data is abnormally high   Diabetes history: DM2 Outpatient Diabetes medications: Lantus 40 units, Humalog 7 units tid meal coverage, Metformin 850 mg bid Current orders for Inpatient glycemic control: Semglee 40 units q hs, Novolog 0-14 units tid correction post meal, Novolog R 7 units tid meal coverage  Inpatient Diabetes Program Recommendations:   Reviewed diabetes orders and agree with current orders. May consider change Novolin R to Novolog as patient is on Humalog @ home.  Thank you, Nani Gasser. Jahkari Maclin, RN, MSN, CDE  Diabetes Coordinator Inpatient Glycemic Control Team Team Pager [phone] (8am-5pm) 02/11/2022 9:22 AM

## 2022-02-11 NOTE — Progress Notes (Signed)
2105 RN to patient room for 1 hr check. Pt c/o dizziness, shaky, and feeling "hot". RN had pt check dexcom BS and dexcom states 75. Consulted with Missy CNM. Gave pt 2 snack sizes PB, 4oz OJ, and saltine crackers. Will recheck CBG 15 mins after finishing snack and reassess symptoms.

## 2022-02-11 NOTE — Progress Notes (Signed)
RN to pt room for CBG recheck. CBG 119. Pt reports effective relief of previous symptoms.
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
## 2022-02-11 NOTE — Telephone Encounter (Signed)
Reached out to pt to reschedule 10/26 ROB appt with Dr. Amalia Hailey at 10:30.  Left message via ASL interpreter for pt to call back to reschedule.

## 2022-02-11 NOTE — Consult Note (Signed)
MFM Note  Jane Schultz is a 41 year old gravida 2 para 1 currently at 22 weeks.  She was hospitalized yesterday for blood pressure and glycemic control.    She has a history of chronic hypertension and pregestational diabetes.  Her blood pressures since admission have been in the 140s to 180s over 60s to 70s range.  She was treated with nifedipine 30 mg daily as an outpatient.  Her nifedipine dose was increased to 60 mg this morning.  Her P/C ratio performed at the time of admission showed significant proteinuria of 6.07.    I reviewed her lab results and it appears that the patient has had significant proteinuria since prior to pregnancy, as her urinalysis performed in January 2023 showed greater than 300 mg of protein.  Her significant proteinuria may be the result of longstanding hypertension and diabetes.  As the patient is asymptomatic and has a history of proteinuria, her blood pressure exacerbation is unlikely due to preeclampsia.  Her Jane Schultz labs were all within normal limits.  As her blood pressures remains elevated despite being treated with 60 mg of nifedipine XL, her nifedipine dose should be increased to 90 mg daily.  As she received 60 mg of nifedipine XL this morning, she should be given another 30 mg of nifedipine XL today.  The patient is being observed in the hospital for a 24-hour urine collection.  Outpatient management may be pursued when she completes her 24-hour urine collection should her blood pressures be in the 140s to 150s over 90s range or lower with the nifedipine XL 90 mg daily.  Adjustments should also be made to her insulin dosage to help her achieve better glycemic control.  We will continue to follow her with monthly growth ultrasounds in our office.  These recommendations were discussed with Dr. Amalia Hailey.

## 2022-02-12 LAB — PROTEIN, URINE, 24 HOUR
Collection Interval-UPROT: 24 hours
Protein, 24H Urine: 4358 mg/d — ABNORMAL HIGH (ref 50–100)
Protein, Urine: 249 mg/dL
Urine Total Volume-UPROT: 1750 mL

## 2022-02-12 LAB — POCT CBG MONITORING
CBG: 82
CBG: 172
CBG: 127

## 2022-02-12 LAB — GLUCOSE, CAPILLARY: Glucose-Capillary: 105 mg/dL — ABNORMAL HIGH (ref 70–99)

## 2022-02-12 MED ORDER — METOPROLOL TARTRATE 50 MG PO TABS
25.0000 mg | ORAL_TABLET | Freq: Two times a day (BID) | ORAL | Status: DC
  Administered 2022-02-12 – 2022-02-13 (×3): 25 mg via ORAL
  Filled 2022-02-12 (×3): qty 0.5

## 2022-02-12 NOTE — Discharge Instructions (Addendum)
Rent/Utility/Housing  Agency Name: [facility] Agency Address: [address] [place], [place] [address] Phone: [phone] Email: [email or web address] Website: [email or web address] Service(s) Offered: Housing services, self-sufficiency, congregate meal  program, weatherization program, Administrator, sports program, emergency food assistance,  housing counseling, home ownership program, wheels -towork program.  Agency Name: [facility] Address: [id number], [place], [place] [address] Phone: [phone] (8a-[phone] (8p- 10p) Email: [email or web address] Website: [email or web address].[email or web address] Service(s) Offered: A program for homeless and/or needy men that includes one-on-one counseling, life skills training and job rehabilitation.  Agency Name: [employer] [facility] [place] Address: [address] [place], [place] [address] Phone: [phone] Website: www.alliedchurches.org Service(s) Offered: Assistance to needy in emergency with utility bills, heating  fuel, and prescriptions. Shelter for homeless 7pm-7am. August 11, 2016 15  Agency Name: [facility] [place] (Developmentally Disabled) Address: [address], [place], [place] [address] Phone: [phone] Contact Person: Susanne Greenhouse Email: [email or web address] Website: [email or web address] Service(s) Offered: Helps individuals with developmental disabilities move  from housing that is more restrictive to homes where they  can achieve greater independence and have more  opportunities.  Agency Name: [employer] Address: [address], [place], [place] [address] Phone: [phone] Email: [email or web address] Website: www.[email or web address].org Service(s) Offered: Provides affordable housing for low-income families,  elderly, and disabled individuals. Offer a wide range of  programs and services, from financial planning  to afterschool and summer programs.  Agency Name: [place] Address: [address] [place], [place] [address] Phone: [phone] Service(s) Offered: Child support services; child welfare services; food stamps;  Medicaid; work first family assistance; and aid with fuel,  rent, food and medicine.  Agency Name: [facility] [place]. Address: [facility] [address], [place], [place]  [address] Phone: [phone] Website: www.familyabuseservices.org Service(s) Offered: 24 hour Crisis Line: [phone]; 24 hour Emergency Shelter;  Transitional Housing; Support Groups; Lexicographer;  [employer]; Hispanic Outreach: 6788701182;  [place]: [phone]. August 11, 2016 16  Agency Name: [facility] Address: [address] [place], [place] [address] Phone: [phone] Service(s) Offered: CAP Services; Home and [employer]; Individual  or Group Supports; Respite Care Non-Institutional Nursing;  Residential Supports; Respite Care and [place]; Transportation; Family and Friends Night;  Recreational Activities; Three Nutritious Meals/Snacks;  Consultation with Registered Dietician; Twenty-four hour  Registered Nurse Access; Daily and [employer]; [place]; [place] for the [employer] (During Summer Months) Bingo Night (Every  Wednesday Night); Special Populations Dance Night  (Every Tuesday Night); Professional Hair Care Services.  Agency Name: [facility] Address: [address], [place], [place] [address] Phone: [phone] Contact Person: Flonnie Hailstone Website: [email or web address].Pharmacist, hospital) Offered: Residential treatment facility for women; food and  clothing, educational & employment development and  transportation to work; Psychologist, counselling of financial skills;  parenting and family  reunification; emotional and spiritual  support; transitional housing for program graduates.  Agency Name: [employer] Address: [address], [place], [place] [address] Phone: [phone] Email: [email or web address] Website: http://www.[email or web address].biz/ Service(s) Offered: Public housing units for elderly, disabled, and low income  people; housing choice vouchers for income eligible  applicants; shelter plus care vouchers; and [employer] program. August 11, 2016 17  Agency Name: [employer] [facility] [facility] Address: [place] [address] [place], [place] [address] Phone: [phone] Email: [email or web address] Website: www.habitatalamance.org Service(s) Offered: Build houses for families in need of decent housing. Each  adult in the family must invest 200 hours of labor on  someone else's house, work with volunteers to build their  own house, attend classes on budgeting, home maintenance, yard care, and attend homeowner association  meetings.  Agency Name: [facility] Address: [place] [address], [place], [place] [address] Phone: [phone] Website: www.rsli.org Service(s) Offered: Intermediate care facilities for mentally retarded,  Supervised Living in group homes for adults with  developmental disabilities, Supervised Living for people  who have dual diagnoses (MRMI), Independent Living,  Supported Living, respite and a variety of CAP services,  pre-vocational services, day supports, and [employer].  Agency Name: [place] Phone: [phone] Website: www.NCForeclosurePrevention.gov Service(s) Offered: Zero-interest, deferred loans to homeowners struggling to  pay their mortgage. Call for more information
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
## 2022-02-12 NOTE — Progress Notes (Signed)
Subjective:    She is 22 weeks and 1 day estimated gestational age.  She has been followed for chronic hypertension and gestational diabetes.  She was admitted 2 and half days ago with increased protein in her urine likely consistent with nephrotic syndrome from chronic illness such as diabetes and hypertension. MFM was consulted yesterday and Dr. Devin Going recommended 90 mg of Procardia.  Patient had several elevated blood pressures that required additional treatment.  I spoke with Dr. Marya Landry today and he has recommended we add metoprolol to the medication regimen and attempt to control her blood pressures. It is his best opinion that the patient does not have a form of superimposed preeclampsia rather if she has nephrotic syndrome.  Control of her blood pressures and sugars during pregnancy are of paramount importance.  Objective:    Patient Vitals for the past 2 hrs:  BP Pulse  02/12/22 1307 (!) 161/77 74   No intake/output data recorded.  Labs: Results for orders placed or performed during the hospital encounter of 02/10/22 (from the past 24 hour(s))  Glucose, capillary     Status: None   Collection Time: 02/11/22  8:10 PM  Result Value Ref Range   Glucose-Capillary 82 70 - 99 mg/dL  Glucose, capillary     Status: Abnormal   Collection Time: 02/11/22  9:44 PM  Result Value Ref Range   Glucose-Capillary 119 (H) 70 - 99 mg/dL    Medications    Current Discharge Medication List     CONTINUE these medications which have NOT CHANGED   Details  aspirin EC 81 MG tablet Take 1 tablet (81 mg total) by mouth daily. Take after 12 weeks for prevention of preeclampssia later in pregnancy Qty: 300 tablet, Refills: 2    blood glucose meter kit and supplies KIT Dispense based on patient and insurance preference. Use up to four times daily as directed. (FOR ICD-9 250.00, 250.01). Qty: 1 each, Refills: 0    Continuous Blood Gluc Receiver (DEXCOM G6 RECEIVER) DEVI Check blood sugars 4 x daily  (fasting and 2 hours postprandial) Qty: 1 each, Refills: 0    Continuous Blood Gluc Sensor (DEXCOM G6 SENSOR) MISC Check blood sugars 4 x daily (fasting and 2 hours postprandial) Qty: 3 each, Refills: 6    Continuous Blood Gluc Transmit (DEXCOM G6 TRANSMITTER) MISC Check blood sugars 4 x daily (fasting and 2 hours postprandial) Qty: 1 each, Refills: 0    insulin glargine (LANTUS SOLOSTAR) 100 UNIT/ML Solostar Pen Inject 40 Units into the skin daily. Qty: 45 mL, Refills: 2    insulin lispro (HUMALOG) 100 UNIT/ML KwikPen Inject 7 Units into the skin 3 (three) times daily. Qty: 20 mL, Refills: 3    metFORMIN (GLUCOPHAGE) 850 MG tablet Take 1 tablet (850 mg total) by mouth 2 (two) times daily with a meal. Qty: 180 tablet, Refills: 3    NIFEdipine (PROCARDIA-XL/NIFEDICAL-XL) 30 MG 24 hr tablet Take 1 tablet (30 mg total) by mouth daily. Can increase to twice a day as needed for symptomatic contractions Qty: 90 tablet, Refills: 3    Prenatal MV & Min w/FA-DHA (PRENATAL ADULT GUMMY/DHA/FA) 0.4-25 MG CHEW Chew 1 each by mouth daily. Qty: 30 tablet, Refills: 3    lidocaine (XYLOCAINE) 2 % jelly Apply 1 application topically 3 (three) times daily as needed. Qty: 30 mL, Refills: 1    metroNIDAZOLE (FLAGYL) 500 MG tablet Take 1 tablet (500 mg total) by mouth 2 (two) times daily. Qty: 14 tablet, Refills: 0  Assessment:    22-week IUP  chronic hypertension  Diabetes  Nephrotic syndrome     Plan:    Procardia 90 XL and will add metoprolol 25 mg twice daily Attempt to control blood pressures.  Once her blood pressures are controlled she may be discharged.  Dr. Tonia Ghent recommendation is that she be followed at [facility] until she reaches 32 weeks or more.  Finis Bud, M.D. 02/12/2022 1:56 PM
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
## 2022-02-13 ENCOUNTER — Other Ambulatory Visit: Payer: Self-pay | Admitting: Licensed Practical Nurse

## 2022-02-13 DIAGNOSIS — I10 Essential (primary) hypertension: Secondary | ICD-10-CM

## 2022-02-13 LAB — GLUCOSE, CAPILLARY
Glucose-Capillary: 70 mg/dL (ref 70–99)
Glucose-Capillary: 86 mg/dL (ref 70–99)
Glucose-Capillary: 90 mg/dL (ref 70–99)
Glucose-Capillary: 59 mg/dL — ABNORMAL LOW (ref 70–99)
Glucose-Capillary: 94 mg/dL (ref 70–99)
Glucose-Capillary: 171 mg/dL — ABNORMAL HIGH (ref 70–99)

## 2022-02-13 LAB — MICROALBUMIN, URINE: Microalb, Ur: 2665.8 ug/mL — ABNORMAL HIGH

## 2022-02-13 MED ORDER — INSULIN ASPART 100 UNIT/ML IJ SOLN
7.0000 [IU] | Freq: Three times a day (TID) | INTRAMUSCULAR | Status: DC
  Administered 2022-02-13: 7 [IU] via SUBCUTANEOUS
  Filled 2022-02-13: qty 1

## 2022-02-13 MED ORDER — BLOOD PRESSURE CUFF MISC: 1.0000 | Freq: Every day | 0 refills | Status: DC

## 2022-02-13 MED ORDER — METOPROLOL TARTRATE 25 MG PO TABS: 25.0000 mg | ORAL_TABLET | Freq: Two times a day (BID) | ORAL | 5 refills | Status: DC

## 2022-02-13 MED ORDER — NIFEDIPINE ER OSMOTIC RELEASE 90 MG PO TB24: 90.0000 mg | ORAL_TABLET | Freq: Every day | ORAL | 5 refills | Status: DC

## 2022-02-13 NOTE — Progress Notes (Signed)
Pt given AVS discharge teaching with tela ALS translator. Pt PIV removed. Taxi contacted and in route.

## 2022-02-13 NOTE — Progress Notes (Signed)
Pt rang out. Once entered room utilized ASL interpreter. Pt stated that when she got up to use the bathroom she felt dizzy and that she has a headache. Checked pt BP and FSBS. VS stable and BS 70. Gave pt some ginger ale and peanut butter and crackers. Pt also wanted a bowl of raisin bran as she stated she was "really hungry". Pt also complained that she felt like her right hip was swollen. No swelling noted to either hip. Offered pt an ice pack for hip but pt refused. No other needs expressed at this time

## 2022-02-13 NOTE — Plan of Care (Signed)
All education complete. Discharge papers discussed with pt via [place].

## 2022-02-13 NOTE — TOC Progression Note (Signed)
Transition of Care [facility]) - Progression Note    Patient Details  Name: Jane Schultz MRN: [id number] Date of Birth: 03-13-1981  Transition of Care [facility]) CM/SW [place], [place] Phone Number: 02/13/2022, 3:28 PM  Clinical Narrative:     Per RN, pt states she cannot pay for her meds (copays), CSW placed resource on AVS from Medicaid site stating:   A provider cannot refuse to provide services if a beneficiary cannot pay a copay at the time of service. If beneficiaries have any questions about Medicaid copays, they should call the [place] [phone]) or their Health Plan's Member Services line.        Expected Discharge Plan and Services           Expected Discharge Date: 02/13/22                                     Social Determinants of Health (SDOH) Interventions    Readmission Risk Interventions     No data to display
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
## 2022-02-13 NOTE — Progress Notes (Signed)
Hypoglycemic Event  CBG: 59  Treatment: 4 oz juice/soda  Symptoms: Sweaty and Shaky  Follow-up CBG: Time:1240  CBG Result:94   Possible Reasons for Event: Unknown  Comments/MD notified: Roberto Scales, Elbert Kaitlynd Phillips

## 2022-02-13 NOTE — Inpatient Diabetes Management (Signed)
ADA Standards of Care 2023 Diabetes in Pregnancy Target Glucose Ranges:  Fasting: 70 - 95 mg/dL 1 hr postprandial:  110 - '140mg'$ /dL (from first bite of meal) 2 hr postprandial:  100 - 120 mg/dL (from first bit of meal)   Diabetes history: DM2 Outpatient Diabetes medications: Lantus 40 units, Humalog 7 units tid meal coverage, Metformin 850 mg bid Current orders for Inpatient glycemic control: Semglee 40 units q hs, Novolog 0-14 units tid correction post meal, Novolog R 7 units tid meal coverage  Call received from CNM regarding recommendations for Novolin R at meal time.  Recommend not using Novolin R and changing to Novolog 7 units with meals.  Orders received.  Will follow.   Thanks,  Adah Perl, RN, BC-ADM Inpatient Diabetes Coordinator Pager [phone]  (8a-5p)

## 2022-02-13 NOTE — TOC Transition Note (Signed)
Transition of Care [facility] [place]) - CM/SW Discharge Note   Patient Details  Name: Jane Schultz MRN: [id number] Date of Birth: 08-06-1980  Transition of Care [facility]) CM/SW Contact:  Loreta Ave, [place] Phone Number: 02/13/2022, 12:23 PM   Clinical Narrative:     CSW received request from RN that pt wanted a blood pressure monitor, pt not eligible due to having insurance, CSW advised to RN that pt would need to have PCP/OB write a prescription for a blood pressure monitor that pt can pick up at the pharmacy.          Patient Goals and CMS Choice        Discharge Placement                       Discharge Plan and Services                                     Social Determinants of Health (SDOH) Interventions     Readmission Risk Interventions     No data to display

## 2022-02-13 NOTE — TOC CM/SW Note (Signed)
Cab voucher provided per patient request per RN to address provided by patient -  [address] [place], [place] [address]   Oleh Genin, [place]

## 2022-02-13 NOTE — Discharge Summary (Addendum)
Physician Discharge Summary  Patient ID: Jane Schultz MRN: [id number] DOB/AGE: 07-18-1980 41 y.o.  Admit date: 02/10/2022 Discharge date: 02/13/2022  Admission Diagnoses:  Discharge Diagnoses:  Principal Problem:   Labor and delivery, indication for care 22wk IUO CHTN Diabetes Nephrotic Syndrome   Discharged Condition: stable  Hospital Course: She was admitted on 10/26 to L&D after several elevated blood pressures at her MFM visit.  In addition to her oral antihypertensives, she received IV labetalol 3 times for severe range pressures. During this stay her Procardia was increased to 76m and Metoprolol 240mtwice a day was started. Her diabetes management  has been different from her home routine as Follows: Lantus 40 units daily, Humalog 7 units tid meal coverage, Metformin 850 mg bid. Inpatient glycemic control: Semglee 40 units q hs, Novolog 0-14 units tid correction post meal, Novolog  7 units tid meal coverage.  Since the IV medication administration and the changing her of her blood pressure mediations, her blood pressures have not been severe range, with the exception of one reading that had a normal follow up reading. She has had periods of hypoglycemia that respond to snacks.   Consults: Diabetes coordinator, MFM   Significant Diagnostic Studies: 24 hour urine Protein 4,358. HA1C 6.3 CBC WNL, AST/ALT WNL   Treatments: IV Labetalol x 3, Procardia 90104maily, Metoprolol 15m31mD, Semglee 40 units q hs, Novolog 0-14 units tid correction post meal, Novolog  7 units tid meal coverage.  Discharge Exam: Blood pressure (!) 147/75, pulse 70, temperature 98.4 F (36.9 C), temperature source Oral, resp. rate 20, height _0  (1.702 m), weight 104 kg, last menstrual period 09/02/2021, SpO2 100 %. General appearance: alert and cooperative Chest wall: breathing without difficulty   Disposition: Discharge disposition: 01-Home or Self Care        Allergies as of  40/29/2023   No Known Allergies      Medication List     STOP taking these medications    lidocaine 2 % jelly Commonly known as: XYLOCAINE   metroNIDAZOLE 500 MG tablet Commonly known as: FLAGYL       TAKE these medications    aspirin EC 81 MG tablet Take 1 tablet (81 mg total) by mouth daily. Take after 12 weeks for prevention of preeclampssia later in pregnancy   blood glucose meter kit and supplies Kit Dispense based on patient and insurance preference. Use up to four times daily as directed. (FOR ICD-9 250.00, 250.01).   Blood Pressure Cuff Misc 1 kit by Does not apply route daily.   Dexcom G6 Receiver Devi Check blood sugars 4 x daily (fasting and 2 hours postprandial)   Dexcom G6 Sensor Misc Check blood sugars 4 x daily (fasting and 2 hours postprandial)   Dexcom G6 Transmitter Misc Check blood sugars 4 x daily (fasting and 2 hours postprandial)   insulin lispro 100 UNIT/ML KwikPen Commonly known as: HUMALOG Inject 7 Units into the skin 3 (three) times daily.   Lantus SoloStar 100 UNIT/ML Solostar Pen Generic drug: insulin glargine Inject 40 Units into the skin daily.   metFORMIN 850 MG tablet Commonly known as: GLUCOPHAGE Take 1 tablet (850 mg total) by mouth 2 (two) times daily with a meal.   metoprolol tartrate 25 MG tablet Commonly known as: LOPRESSOR Take 1 tablet (25 mg total) by mouth 2 (two) times daily.   NIFEdipine 90 MG 24 hr tablet Commonly known as: PROCARDIA XL/NIFEDICAL-XL Take 1 tablet (90 mg total) by mouth daily. Start taking  on: February 14, 2022 What changed:  medication strength how much to take additional instructions   Prenatal Adult Gummy/DHA/FA 0.4-25 MG Chew Chew 1 each by mouth daily.        Follow-up [facility] Follow up in 3 day(s).   Specialty: Maternal and Fetal Medicine Contact information: [address] [place]   [id number] [phone]                Signed: Jillene Bucks [facility] 02/13/2022, 12:50 PM

## 2022-02-13 NOTE — Progress Notes (Signed)
Pt does not have home blood pressure cuf, per TOC, provider may prescribe cuff so that pt is able to obtain a cuff. Script sent to [facility]  02/13/22  12:40 PM

## 2022-02-14 ENCOUNTER — Telehealth: Payer: Self-pay

## 2022-02-14 NOTE — Telephone Encounter (Signed)
Patient is calling after being under the impression she would have a referral placed to follow-up with MFM after recent L&D visit. She states calling to make an appointment and was not able to schedule it due to no referral from [facility]. Patient seemed very overwhelmed as she has been calling places for hours now. Please advise on referral in question.

## 2022-02-14 NOTE — Telephone Encounter (Signed)
Contacted pt via interpreter to reschedule 10/26 ROB appt with Dr. Amalia Hailey.  Pt is scheduled with Dr. Amalia Hailey on 11/2 at 1:30.

## 2022-02-15 NOTE — Progress Notes (Signed)
Referring Provider: Jeannie Fend, [facility] Length of Consultation: 60 minutes  Ms. Wittke was referred to [facility] at [facility] for genetic counseling because of advanced maternal age, low fetal fraction on cell free DNA testing and a history of hearing loss. This note summarizes the information we discussed with she and her partner with the aid of a sign language interpreter.    Advanced Maternal Age: The patient will be 41 years old at the time of delivery. We explained that the chance of a chromosome abnormality increases with maternal age. Chromosomes and examples of chromosome problems were reviewed.  Humans typically have 46 chromosomes in each cell, with half passed through each sperm and egg.  Any change in the number or structure of chromosomes can increase the risk of problems in the physical and mental development of a pregnancy.   Based upon age of the patient and the current gestational age, the chance of any chromosome abnormality was 1 in 29. The chance of Down syndrome, the most common chromosome problem associated with maternal age, was 1 in 70.  The risk of chromosome problems is in addition to the 3% general population risk for birth defects and intellectual disabilities.  The greatest chance, of course, is that the baby would be born in good health.  Abnormal Panorama with low fetal fraction: We also reviewed that Ms. Klemmer had Panorama NIPS through [facility] that failed twice due to insufficient fetal fraction. NIPS analyzes cell free fetal DNA found in the maternal blood circulation during pregnancy. This test is not diagnostic for chromosome conditions, but can provide information regarding the presence or absence of extra fetal DNA for chromosomes 13, 18 and 21. The term "fetal fraction" refers to the amount of sample that is believed to have come from placental DNA (which should therefore represent fetus) rather than maternal DNA. We  reviewed that there are many possible reasons a sample may have a low fetal fraction, including early gestational age, high maternal BMI, suboptimal sample collection, maternal use of medications like low molecular weight heparin, pregnancy loss, pregnancy complications, and normal variation. Low fetal fraction is also associated with an increased risk for chromosomal aneuploidy in cases that fail two times due to insufficient fetal fraction. The laboratory reports a chance of 1 in 53 for triploidy, Trisomy 13 or Trisomy 18 given the low fetal fraction. The chance for Down syndrome and Monosomy X remains unchanged. We discussed that redrawing a third sample for NIPS was possible, though it may still not provide a result. Ms. Gwinner declined to have another redraw for NIPS.   We discussed the following additional testing options for this pregnancy:  Targeted ultrasound uses high frequency sound waves to create an image of the developing fetus.  An ultrasound is often recommended as a routine means of evaluating the pregnancy.  It is also used to screen for fetal anatomy problems (for example, a heart defect) that might be suggestive of a chromosomal or other abnormality. A detailed ultrasound was performed at the time of this visit.  No evidence of a chromosome condition was noted today, at [redacted] weeks gestation, though some of the anatomy was suboptimally visualized.  See that report for results and follow up plans.  Amniocentesis was offered as a diagnostic testing option for chromosome conditions.  This procedure involves the removal of a small amount of amniotic fluid from the sac surrounding the fetus with the use of a thin needle inserted through the maternal abdomen and  uterus.  Ultrasound guidance is used throughout the procedure.  Fetal cells from amniotic fluid are directly evaluated and > 99.5% of chromosome conditions and > 98% of open neural tube defects can be detected. This procedure is  generally performed after the 15th week of pregnancy.  The main risks to this procedure include complications leading to miscarriage in less than 1 in 500 cases.  Alpha thalassemia silent carrier: Horizon carrier screening was performed on Ms. Posten at her OB office. Those results were negative for cystic fibrosis, spinal muscular atrophy and beta hemoglobinopathies.  She was not found to carry any of the common variants in those genes, which significantly reduces, but cannot eliminate, the chance for her to be a carrier for those conditions.    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  This condition is different in its inheritance compared to other hemoglobinopathies as there are two copies of two alpha globin genes (HBA1 and HBA2) on each chromosome 16, or four alpha globin genes total (aa/aa). A person can be a carrier of one alpha gene mutation (aa/a-), also referred to as a "silent carrier". A person who carries two alpha globin gene mutations can either carry them in cis (both on the same chromosome, denoted as aa/--) or in trans (on different chromosomes, denoted as a-/a-). Alpha-thalassemia carriers of two mutations who have African American ancestry are much more likely to have a trans arrangement (a-/a-); cis configuration is reported to be rare in individuals with African American ancestry.     There are several different forms of alpha-thalassemia. The most severe form of alpha-thalassemia, Hb Barts, is associated with an absence of alpha globin chain synthesis as a result of deletions of all four alpha globin genes (--/--).  Given that Ms. Belleville is a silent carrier (aa/a-), her pregnancies would not be at increased risk for Hb Barts, even if her partner is a carrier for alpha-thalassemia, as she will always pass on at least one copy of the alpha globin gene to her children. Hemoglobin H (HbH) disease is caused by three deleted or  dysfunctioning alpha globin alleles (a-/--) and is characterized by microcytic hypochromic hemolytic anemia, hepatosplenomegaly, mild jaundice, growth retardation, and sometimes thalassemia-like bone changes. Given Ms. Saari' silent carrier status (aa/a-), the current fetus would only be at risk for HbH disease (a-/--), if her partner is a carrier for two alpha globin mutations in cis (aa/--). If this is the case, the risk for HbH disease in the pregnancy would be 1 in 4 (25%). However, if her partner is a carrier for two alpha globin mutations, he would be more likely to carry them in trans configuration (a-/a-) than the cis configuration (aa/--), given his ethnicity. If he is a carrier of alpha-thalassemia in trans, then the pregnancy would not be at increased risk for HbH disease and would at most be a carrier of two gene changes in trans (a-/a-).  Carriers may have mild anemia, or small red blood cells (low MCV on CBC), but are expected to be healthy. Based on the carrier frequency for alpha-thalassemia in the African American population, the father of the pregnancy has a 1 in 30 chance of being any type of carrier for alpha-thalassemia. We also discussed that if both parents are known to be carriers, then testing during pregnancy through amniocentesis or [facility] would be made available. The couple declined testing on her partner today.  Family history and pregnancy history: We obtained a detailed  family history and pregnancy history.  This is the first pregnancy for this couple.  The patient has a healthy son from a prior marriage.  She has a complex medical history affecting the pregnancy including diabetes, chronic hypertension.  Please see the MFM notes for details of that history and recommendations. The father of the pregnancy is 22 years old and hard of hearing.  He reads lips well but did utilize the interpreter during this visit.  He is reported to have been born severely premature and to have  had hearing loss from birth.  It is unclear if this is due to complications of prematurity or was congenital. He is an only child an has limited family history information, though reports no other individuals with hearing loss, birth differences or known genetic conditions.  Ms. 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  Her mother told her that she had normal hearing prior to the illness.  The patient has two maternal half sisters, both of whom have normal hearing.  One sister has a son with congenital deafness.  The other sister has two living daughters who are in good health, one son who passed away due to a motor vehicle accident and one daughter who passed away due to an abdominal wall defect. The patient also reports a maternal uncle who passed away at birth from unknown causes. Her mother passed away in her 52s from a "blood cancer" and her father passed away from complications of diabetes and stroke. She also recalls a "great aunt" who was deaf and mute, but does not know how that person is related to the family. The remainder of the family history is unremarkable for birth defects, developmental delays, recurrent pregnancy loss or known chromosome abnormalities.  Family History of Hearing Loss. Hearing loss can present at birth or develop over time and can be caused by damage to structures of the inner ear (sensorineural), changes in the middle ear (conductive), or both inner and middle ear (mixed). Hearing loss can be caused by many factors including recurrent ear infections, other various infections, ototoxic medications, head injury, and genetic causes. Hearing loss varies in affecting one or both ears, the range of loss, and can be stable or progressive. Particular forms or causes of hearing loss can show distinctive patterns. It is recommended that individuals with hearing loss or deafness, particularly children, have complete evaluation for  causes including a genetics evaluation. If an individual is found to have a genetic cause, other family members have an increased chance to have hearing loss or a child with hearing loss. Specific risks depend on the inheritance pattern of the particular gene and degree of relationship to the individual with hearing loss. If Ms. Dittman' deafness is due to the infection, then we would not expect a high risk for her children.  However, given the father of the baby being hard of hearing and a nephew with hearing loss, we cannot rule out inherited factors.  We talked about different inheritance patterns including autosomal recessive. If any of the affected family members were diagnosed with a genetic cause of deafness, we could more accurately quote risk for the current pregnancy to be affected. The patient expressed that she was not concerned about her baby being deaf or hard of hearing.  Abdominal wall defects.  There are two types of abdominal wall defects, gastroschisis and omphalocele.  Gastroschisis occurs when the intestines are on the outside of the body  and most often occurs as an isolated condition, not due to an underlying genetic syndrome. Omphalocele, which is when the abdominal contents are present in a membrane covered sac, is much more likely to be associated with other birth defects as well as underlying genetic syndromes.  In order to determine recurrence risks in a family, more detailed medical information would be needed. For the current pregnancy, no evidence of an abdominal wall defect was noted on detailed ultrasound and the maternal serum AFP results were within normal limits, which greatly reduces the chance for these conditions.   Ms. Somma was encouraged to call with questions or concerns.  We can be contacted at [phone].  Plan of Care: The patient declined a third repeat of the Panorama testing. She also declined amniocentesis for chromosome  conditions. The couple declined to have testing for alpha thalassemia on her partner. A referral was made for fetal echocardigram due to maternal health conditions and age.       Wilburt Finlay, MS, CGC

## 2022-02-16 ENCOUNTER — Other Ambulatory Visit: Payer: Self-pay | Admitting: Licensed Practical Nurse

## 2022-02-16 ENCOUNTER — Other Ambulatory Visit: Payer: Self-pay | Admitting: Certified Nurse Midwife

## 2022-02-16 DIAGNOSIS — O0990 Supervision of high risk pregnancy, unspecified, unspecified trimester: Secondary | ICD-10-CM

## 2022-02-17 ENCOUNTER — Encounter: Payer: Medicaid Other | Admitting: Obstetrics and Gynecology

## 2022-02-17 ENCOUNTER — Telehealth: Payer: Medicaid Other

## 2022-02-17 DIAGNOSIS — O0992 Supervision of high risk pregnancy, unspecified, second trimester: Secondary | ICD-10-CM

## 2022-02-17 DIAGNOSIS — Z3A22 22 weeks gestation of pregnancy: Secondary | ICD-10-CM

## 2022-02-17 NOTE — Telephone Encounter (Signed)
Patient is being seen by MFM today, instructed to follow-up with AOB in 2 weeks. [place] office lvm for patient.

## 2022-02-18 ENCOUNTER — Telehealth: Payer: Self-pay | Admitting: Obstetrics and Gynecology

## 2022-02-18 DIAGNOSIS — E119 Type 2 diabetes mellitus without complications: Secondary | ICD-10-CM

## 2022-02-18 NOTE — Telephone Encounter (Signed)
We been trying to get patient scheduled for to come back in 2 weeks. Which will be around 03-02-2022 with Dr.Cherry or Dr.Evans.

## 2022-02-21 NOTE — Telephone Encounter (Signed)
Reached out to pt(2x) to get her scheduled for a ROB with Dr. Marcelline Mates or Dr. Amalia Hailey.  Possible appt would be around 11/15 with Evans or [facility].

## 2022-02-23 NOTE — Telephone Encounter (Signed)
Pt is scheduled with Dr. Amalia Hailey on 03/03/22 at 3:30.

## 2022-02-24 NOTE — Telephone Encounter (Signed)
Pt is scheduled on 11/16 at 3:30 with Dr. Amalia Hailey.  Interpreter has been requested.

## 2022-02-28 MED ORDER — DEXCOM G6 SENSOR MISC: 6 refills | Status: DC

## 2022-03-03 ENCOUNTER — Encounter: Payer: Medicaid Other | Admitting: Obstetrics and Gynecology

## 2022-03-03 DIAGNOSIS — Z3A24 24 weeks gestation of pregnancy: Secondary | ICD-10-CM

## 2022-03-03 DIAGNOSIS — O0990 Supervision of high risk pregnancy, unspecified, unspecified trimester: Secondary | ICD-10-CM

## 2022-03-04 ENCOUNTER — Telehealth: Payer: Self-pay | Admitting: Obstetrics and Gynecology

## 2022-03-04 NOTE — Telephone Encounter (Signed)
This pt was scheduled on 11/16 with Dr. Amalia Hailey but her transportation cancelled on her.  She has not been seen since 10/26 and she has some concerns.  Can I work her in on 11/21 or 11/22 at one of your "provider must approve" spots?

## 2022-03-04 NOTE — Telephone Encounter (Signed)
Please disregard the last encounter concerning this pt.  You have openings on 11/22 in the afternoon.

## 2022-03-08 NOTE — Telephone Encounter (Signed)
Patient is scheduled for 11/22 with Dr Marcelline Mates

## 2022-03-09 ENCOUNTER — Ambulatory Visit (INDEPENDENT_AMBULATORY_CARE_PROVIDER_SITE_OTHER): Payer: Medicaid Other | Admitting: Obstetrics and Gynecology

## 2022-03-09 VITALS — BP 120/80 | Wt 260.0 lb

## 2022-03-09 DIAGNOSIS — O10012 Pre-existing essential hypertension complicating pregnancy, second trimester: Secondary | ICD-10-CM

## 2022-03-09 DIAGNOSIS — O0990 Supervision of high risk pregnancy, unspecified, unspecified trimester: Secondary | ICD-10-CM

## 2022-03-09 DIAGNOSIS — O10912 Unspecified pre-existing hypertension complicating pregnancy, second trimester: Secondary | ICD-10-CM

## 2022-03-09 DIAGNOSIS — O99891 Other specified diseases and conditions complicating pregnancy: Secondary | ICD-10-CM

## 2022-03-09 DIAGNOSIS — O09522 Supervision of elderly multigravida, second trimester: Secondary | ICD-10-CM

## 2022-03-09 DIAGNOSIS — Z87891 Personal history of nicotine dependence: Secondary | ICD-10-CM

## 2022-03-09 DIAGNOSIS — Z794 Long term (current) use of insulin: Secondary | ICD-10-CM

## 2022-03-09 DIAGNOSIS — E119 Type 2 diabetes mellitus without complications: Secondary | ICD-10-CM

## 2022-03-09 DIAGNOSIS — R600 Localized edema: Secondary | ICD-10-CM

## 2022-03-09 DIAGNOSIS — I10 Essential (primary) hypertension: Secondary | ICD-10-CM

## 2022-03-09 DIAGNOSIS — O10919 Unspecified pre-existing hypertension complicating pregnancy, unspecified trimester: Secondary | ICD-10-CM

## 2022-03-09 DIAGNOSIS — O24112 Pre-existing diabetes mellitus, type 2, in pregnancy, second trimester: Secondary | ICD-10-CM

## 2022-03-09 DIAGNOSIS — H9193 Unspecified hearing loss, bilateral: Secondary | ICD-10-CM

## 2022-03-09 DIAGNOSIS — Z3A25 25 weeks gestation of pregnancy: Secondary | ICD-10-CM

## 2022-03-09 DIAGNOSIS — Z7984 Long term (current) use of oral hypoglycemic drugs: Secondary | ICD-10-CM

## 2022-03-09 LAB — POCT URINALYSIS DIPSTICK OB
Bilirubin, UA: NEGATIVE
Glucose, UA: NEGATIVE
Ketones, UA: NEGATIVE
Leukocytes, UA: NEGATIVE
Nitrite, UA: NEGATIVE
Spec Grav, UA: 1.02 (ref 1.010–1.025)
Urobilinogen, UA: 0.2 E.U./dL
pH, UA: 6.5 (ref 5.0–8.0)

## 2022-03-09 MED ORDER — FUROSEMIDE 20 MG PO TABS: 20.0000 mg | ORAL_TABLET | ORAL | 0 refills | Status: DC

## 2022-03-09 NOTE — Progress Notes (Signed)
ASL Interpreter present for today's visit  ROB: Patient presents for routine OB visit.  Complains of moderate swelling in her legs after her hospital admission to control her blood pressures and blood sugars on 02/10/2022.  She reports that her blood sugars overall look okay.  Did not bring her glucose meter for blood sugar log today.  Does still note some occasional postprandials average 200s but usually this is after something that she has eaten.  States that most are usually 135 or less.  Fastings can range between 87-1 15.  Notes that she does eat a snack at night to prevent hypoglycemia.  On further probing of her snacks she reports that she typically eats peanut butter crackers as well as some fruit.  I discussed which fruits may be causing her blood sugars to be elevated as she does report that she usually will eat something with pineapples.  I advised on low glycemic fruits or can eliminate fruits altogether from the nighttime snack.  Blood pressure is much better controlled, currently on Procardia 90 mg, and metoprolol 25 mg.  Exam today with +3 edema up to knees.  Will prescribe Lasix 20 mg to take for 2 days.  Also instructed on compression stockings.  UA with significant protein today however blood pressures are normal.  Proteinuria may be from kidney disease associated with hypertension or for diabetes, so may be leading to some nephrotic syndrome.  Patient notes that she is try to get a BP cuff as recommended from her pharmacy however notes that she was told she would require to get this from specialty pharmacy.  Will attempt to prescribe.  Lastly, patient was advised on transfer of care to [facility] due to being high risk, however due to patient's housing situation as well as transportation issues (uses Medicaid transportation [place]) she notes she would not be able to attend appointments in [place] so we will continue to manage care in [place].  Also will refer patient to cardiologist as she  notes that she has not been seen evaluated by cardiologist.  Notes that when she was hospitalized for her diabetes earlier in the year they did recommend that she follow-up with a cardiologist but never was able to be scheduled.  Will place referral.  Patient did have a fetal echo which was normal.  Is due to see MFM again next week. RTC in 2-3 weeks.  Discussed importance of bringing blood sugar log or Dexcom meter every visit.

## 2022-03-14 ENCOUNTER — Other Ambulatory Visit: Payer: Self-pay

## 2022-03-14 DIAGNOSIS — F419 Anxiety disorder, unspecified: Secondary | ICD-10-CM

## 2022-03-14 DIAGNOSIS — O10919 Unspecified pre-existing hypertension complicating pregnancy, unspecified trimester: Secondary | ICD-10-CM

## 2022-03-14 DIAGNOSIS — I10 Essential (primary) hypertension: Secondary | ICD-10-CM

## 2022-03-14 DIAGNOSIS — H9193 Unspecified hearing loss, bilateral: Secondary | ICD-10-CM

## 2022-03-14 DIAGNOSIS — O09522 Supervision of elderly multigravida, second trimester: Secondary | ICD-10-CM

## 2022-03-14 DIAGNOSIS — D563 Thalassemia minor: Secondary | ICD-10-CM

## 2022-03-15 ENCOUNTER — Ambulatory Visit: Payer: Medicaid Other

## 2022-03-17 ENCOUNTER — Observation Stay
Admission: EM | Admit: 2022-03-17 | Discharge: 2022-03-18 | Disposition: A | Discharge status: Home / Self Care | Payer: Medicaid Other | Attending: Obstetrics and Gynecology | Admitting: Obstetrics and Gynecology

## 2022-03-17 ENCOUNTER — Encounter: Payer: Self-pay | Admitting: Obstetrics and Gynecology

## 2022-03-17 ENCOUNTER — Other Ambulatory Visit: Payer: Self-pay

## 2022-03-17 ENCOUNTER — Ambulatory Visit: Payer: Medicaid Other

## 2022-03-17 DIAGNOSIS — Z3A27 27 weeks gestation of pregnancy: Secondary | ICD-10-CM | POA: Diagnosis not present

## 2022-03-17 DIAGNOSIS — J45909 Unspecified asthma, uncomplicated: Secondary | ICD-10-CM | POA: Insufficient documentation

## 2022-03-17 DIAGNOSIS — E119 Type 2 diabetes mellitus without complications: Secondary | ICD-10-CM | POA: Diagnosis not present

## 2022-03-17 DIAGNOSIS — O99512 Diseases of the respiratory system complicating pregnancy, second trimester: Secondary | ICD-10-CM | POA: Diagnosis not present

## 2022-03-17 DIAGNOSIS — Z79899 Other long term (current) drug therapy: Secondary | ICD-10-CM | POA: Insufficient documentation

## 2022-03-17 DIAGNOSIS — O10012 Pre-existing essential hypertension complicating pregnancy, second trimester: Secondary | ICD-10-CM | POA: Diagnosis not present

## 2022-03-17 DIAGNOSIS — Z87891 Personal history of nicotine dependence: Secondary | ICD-10-CM | POA: Insufficient documentation

## 2022-03-17 DIAGNOSIS — O1202 Gestational edema, second trimester: Secondary | ICD-10-CM | POA: Diagnosis present

## 2022-03-17 DIAGNOSIS — Z794 Long term (current) use of insulin: Secondary | ICD-10-CM | POA: Diagnosis not present

## 2022-03-17 DIAGNOSIS — O24112 Pre-existing diabetes mellitus, type 2, in pregnancy, second trimester: Secondary | ICD-10-CM | POA: Insufficient documentation

## 2022-03-17 DIAGNOSIS — O162 Unspecified maternal hypertension, second trimester: Secondary | ICD-10-CM | POA: Diagnosis present

## 2022-03-17 DIAGNOSIS — Z7984 Long term (current) use of oral hypoglycemic drugs: Secondary | ICD-10-CM | POA: Diagnosis not present

## 2022-03-17 LAB — COMPREHENSIVE METABOLIC PANEL
ALT: 18 U/L (ref 0–44)
AST: 20 U/L (ref 15–41)
Albumin: 2.1 g/dL — ABNORMAL LOW (ref 3.5–5.0)
Alkaline Phosphatase: 97 U/L (ref 38–126)
Anion gap: 7 (ref 5–15)
BUN: 15 mg/dL (ref 6–20)
CO2: 21 mmol/L — ABNORMAL LOW (ref 22–32)
Calcium: 8.2 mg/dL — ABNORMAL LOW (ref 8.9–10.3)
Chloride: 107 mmol/L (ref 98–111)
Creatinine, Ser: 0.7 mg/dL (ref 0.44–1.00)
GFR, Estimated: >60 mL/min (ref >60)
Glucose, Bld: 110 mg/dL — ABNORMAL HIGH (ref 70–99)
Potassium: 3.7 mmol/L (ref 3.5–5.1)
Sodium: 135 mmol/L (ref 135–145)
Total Bilirubin: 0.5 mg/dL (ref 0.3–1.2)
Total Protein: 6.5 g/dL (ref 6.5–8.1)

## 2022-03-17 LAB — CBC
HCT: 31.9 % — ABNORMAL LOW (ref 36.0–46.0)
Hemoglobin: 10.7 g/dL — ABNORMAL LOW (ref 12.0–15.0)
MCH: 28.2 pg (ref 26.0–34.0)
MCHC: 33.5 g/dL (ref 30.0–36.0)
MCV: 83.9 fL (ref 80.0–100.0)
Platelets: 267 10*3/uL (ref 150–400)
RBC: 3.8 MIL/uL — ABNORMAL LOW (ref 3.87–5.11)
RDW: 13.2 % (ref 11.5–15.5)
WBC: 11.7 10*3/uL — ABNORMAL HIGH (ref 4.0–10.5)
nRBC: 0 % (ref 0.0–0.2)

## 2022-03-17 MED ORDER — LACTATED RINGERS IV SOLN: INTRAVENOUS | Status: DC

## 2022-03-17 MED ORDER — LABETALOL HCL 5 MG/ML IV SOLN
80.0000 mg | INTRAVENOUS | Status: DC | PRN
  Administered 2022-03-18: 80 mg via INTRAVENOUS

## 2022-03-17 MED ORDER — HYDRALAZINE HCL 20 MG/ML IJ SOLN
10.0000 mg | INTRAMUSCULAR | Status: DC | PRN
  Administered 2022-03-18: 10 mg via INTRAVENOUS
  Filled 2022-03-17: qty 1

## 2022-03-17 MED ORDER — ACETAMINOPHEN 500 MG PO TABS
1000.0000 mg | ORAL_TABLET | Freq: Four times a day (QID) | ORAL | Status: AC | PRN
  Administered 2022-03-17: 1000 mg via ORAL
  Filled 2022-03-17: qty 2

## 2022-03-17 MED ORDER — LABETALOL HCL 5 MG/ML IV SOLN
20.0000 mg | INTRAVENOUS | Status: DC | PRN
  Administered 2022-03-18: 20 mg via INTRAVENOUS
  Filled 2022-03-17: qty 4

## 2022-03-17 MED ORDER — LABETALOL HCL 5 MG/ML IV SOLN
40.0000 mg | INTRAVENOUS | Status: DC | PRN
  Administered 2022-03-18: 40 mg via INTRAVENOUS
  Filled 2022-03-17 (×2): qty 8

## 2022-03-17 NOTE — OB Triage Note (Addendum)
Jane Schultz 41 y.o. presents to Labor & Delivery triage via wheelchair steered by ED staff reporting high blood pressures and bilateral lower extremity edema. She is a G2P0101 at [redacted]w[redacted]d. She denies signs and symptoms consistent with rupture of membranes or active vaginal bleeding. She denies contractions and states positive fetal movement. She denies vision changes and right upper quadrant pain. She reports taking a dose of Lasix on Friay and Sunday. External FM and TOCO applied to non-tender abdomen. Initial FHR 125. Vital signs obtained and within normal limits. Patient oriented to care environment including call bell and bed control use. JRod Can CNM notified of patient's arrival. CNM will consult with Dr. EAmalia Haileyfor orders. ASL video interpreter used for all pt communication. Significant other at bedside.

## 2022-03-17 NOTE — Progress Notes (Signed)
IV placement attempted. IV team consult placed. CNM at bedside. BPs have improved while attempting IV placement. CNM at bedside and aware of IV placement difficulty.

## 2022-03-18 ENCOUNTER — Observation Stay: Payer: Medicaid Other

## 2022-03-18 ENCOUNTER — Encounter (HOSPITAL_COMMUNITY): Payer: Self-pay | Admitting: Obstetrics and Gynecology

## 2022-03-18 ENCOUNTER — Other Ambulatory Visit: Payer: Self-pay

## 2022-03-18 ENCOUNTER — Observation Stay
Admit: 2022-03-18 | Discharge: 2022-03-18 | Disposition: A | Discharge status: Home / Self Care | Payer: Medicaid Other | Attending: Advanced Practice Midwife | Admitting: Advanced Practice Midwife

## 2022-03-18 ENCOUNTER — Inpatient Hospital Stay (HOSPITAL_COMMUNITY)
Admission: AD | Admit: 2022-03-18 | Discharge: 2022-04-02 | DRG: 788 | Disposition: A | Discharge status: Home / Self Care | Payer: Medicaid Other | Attending: Obstetrics and Gynecology | Admitting: Obstetrics and Gynecology

## 2022-03-18 DIAGNOSIS — I10 Essential (primary) hypertension: Secondary | ICD-10-CM | POA: Diagnosis not present

## 2022-03-18 DIAGNOSIS — O365930 Maternal care for other known or suspected poor fetal growth, third trimester, not applicable or unspecified: Secondary | ICD-10-CM | POA: Diagnosis present

## 2022-03-18 DIAGNOSIS — H9190 Unspecified hearing loss, unspecified ear: Secondary | ICD-10-CM | POA: Diagnosis present

## 2022-03-18 DIAGNOSIS — O2412 Pre-existing diabetes mellitus, type 2, in childbirth: Secondary | ICD-10-CM | POA: Diagnosis present

## 2022-03-18 DIAGNOSIS — O1414 Severe pre-eclampsia complicating childbirth: Secondary | ICD-10-CM | POA: Diagnosis not present

## 2022-03-18 DIAGNOSIS — O9962 Diseases of the digestive system complicating childbirth: Secondary | ICD-10-CM | POA: Diagnosis present

## 2022-03-18 DIAGNOSIS — O34211 Maternal care for low transverse scar from previous cesarean delivery: Secondary | ICD-10-CM | POA: Diagnosis present

## 2022-03-18 DIAGNOSIS — E119 Type 2 diabetes mellitus without complications: Secondary | ICD-10-CM | POA: Diagnosis present

## 2022-03-18 DIAGNOSIS — O9902 Anemia complicating childbirth: Secondary | ICD-10-CM | POA: Diagnosis present

## 2022-03-18 DIAGNOSIS — K219 Gastro-esophageal reflux disease without esophagitis: Secondary | ICD-10-CM | POA: Diagnosis present

## 2022-03-18 DIAGNOSIS — O09522 Supervision of elderly multigravida, second trimester: Secondary | ICD-10-CM | POA: Diagnosis present

## 2022-03-18 DIAGNOSIS — E669 Obesity, unspecified: Secondary | ICD-10-CM | POA: Diagnosis not present

## 2022-03-18 DIAGNOSIS — O10919 Unspecified pre-existing hypertension complicating pregnancy, unspecified trimester: Secondary | ICD-10-CM | POA: Diagnosis present

## 2022-03-18 DIAGNOSIS — O99210 Obesity complicating pregnancy, unspecified trimester: Secondary | ICD-10-CM | POA: Diagnosis not present

## 2022-03-18 DIAGNOSIS — Z23 Encounter for immunization: Secondary | ICD-10-CM | POA: Diagnosis not present

## 2022-03-18 DIAGNOSIS — Z30430 Encounter for insertion of intrauterine contraceptive device: Secondary | ICD-10-CM

## 2022-03-18 DIAGNOSIS — O114 Pre-existing hypertension with pre-eclampsia, complicating childbirth: Secondary | ICD-10-CM | POA: Diagnosis present

## 2022-03-18 DIAGNOSIS — Z3A28 28 weeks gestation of pregnancy: Secondary | ICD-10-CM | POA: Diagnosis not present

## 2022-03-18 DIAGNOSIS — O1002 Pre-existing essential hypertension complicating childbirth: Secondary | ICD-10-CM | POA: Diagnosis present

## 2022-03-18 DIAGNOSIS — O24113 Pre-existing diabetes mellitus, type 2, in pregnancy, third trimester: Secondary | ICD-10-CM | POA: Diagnosis not present

## 2022-03-18 DIAGNOSIS — O24414 Gestational diabetes mellitus in pregnancy, insulin controlled: Secondary | ICD-10-CM | POA: Diagnosis not present

## 2022-03-18 DIAGNOSIS — O99214 Obesity complicating childbirth: Secondary | ICD-10-CM | POA: Diagnosis present

## 2022-03-18 DIAGNOSIS — O99212 Obesity complicating pregnancy, second trimester: Secondary | ICD-10-CM

## 2022-03-18 DIAGNOSIS — Z7984 Long term (current) use of oral hypoglycemic drugs: Secondary | ICD-10-CM

## 2022-03-18 DIAGNOSIS — O133 Gestational [pregnancy-induced] hypertension without significant proteinuria, third trimester: Secondary | ICD-10-CM | POA: Diagnosis not present

## 2022-03-18 DIAGNOSIS — O1492 Unspecified pre-eclampsia, second trimester: Secondary | ICD-10-CM | POA: Diagnosis not present

## 2022-03-18 DIAGNOSIS — O1202 Gestational edema, second trimester: Secondary | ICD-10-CM

## 2022-03-18 DIAGNOSIS — Z794 Long term (current) use of insulin: Secondary | ICD-10-CM

## 2022-03-18 DIAGNOSIS — O24112 Pre-existing diabetes mellitus, type 2, in pregnancy, second trimester: Secondary | ICD-10-CM

## 2022-03-18 DIAGNOSIS — Z87891 Personal history of nicotine dependence: Secondary | ICD-10-CM

## 2022-03-18 DIAGNOSIS — O99892 Other specified diseases and conditions complicating childbirth: Secondary | ICD-10-CM | POA: Diagnosis present

## 2022-03-18 DIAGNOSIS — O10912 Unspecified pre-existing hypertension complicating pregnancy, second trimester: Secondary | ICD-10-CM | POA: Diagnosis not present

## 2022-03-18 DIAGNOSIS — M25551 Pain in right hip: Secondary | ICD-10-CM | POA: Diagnosis not present

## 2022-03-18 DIAGNOSIS — O99213 Obesity complicating pregnancy, third trimester: Secondary | ICD-10-CM | POA: Diagnosis not present

## 2022-03-18 DIAGNOSIS — O328XX0 Maternal care for other malpresentation of fetus, not applicable or unspecified: Secondary | ICD-10-CM | POA: Diagnosis present

## 2022-03-18 DIAGNOSIS — O10012 Pre-existing essential hypertension complicating pregnancy, second trimester: Secondary | ICD-10-CM | POA: Diagnosis not present

## 2022-03-18 DIAGNOSIS — O99332 Smoking (tobacco) complicating pregnancy, second trimester: Secondary | ICD-10-CM

## 2022-03-18 DIAGNOSIS — Z91199 Patient's noncompliance with other medical treatment and regimen due to unspecified reason: Secondary | ICD-10-CM

## 2022-03-18 DIAGNOSIS — O99893 Other specified diseases and conditions complicating puerperium: Secondary | ICD-10-CM | POA: Diagnosis not present

## 2022-03-18 DIAGNOSIS — O0992 Supervision of high risk pregnancy, unspecified, second trimester: Secondary | ICD-10-CM

## 2022-03-18 DIAGNOSIS — Z148 Genetic carrier of other disease: Secondary | ICD-10-CM

## 2022-03-18 DIAGNOSIS — Z3A27 27 weeks gestation of pregnancy: Secondary | ICD-10-CM | POA: Diagnosis not present

## 2022-03-18 DIAGNOSIS — O99512 Diseases of the respiratory system complicating pregnancy, second trimester: Secondary | ICD-10-CM | POA: Diagnosis not present

## 2022-03-18 DIAGNOSIS — O162 Unspecified maternal hypertension, second trimester: Secondary | ICD-10-CM | POA: Diagnosis present

## 2022-03-18 DIAGNOSIS — O321XX0 Maternal care for breech presentation, not applicable or unspecified: Secondary | ICD-10-CM | POA: Diagnosis not present

## 2022-03-18 DIAGNOSIS — O99891 Other specified diseases and conditions complicating pregnancy: Secondary | ICD-10-CM

## 2022-03-18 DIAGNOSIS — Z98891 History of uterine scar from previous surgery: Secondary | ICD-10-CM

## 2022-03-18 DIAGNOSIS — O24319 Unspecified pre-existing diabetes mellitus in pregnancy, unspecified trimester: Secondary | ICD-10-CM | POA: Diagnosis not present

## 2022-03-18 DIAGNOSIS — H9193 Unspecified hearing loss, bilateral: Secondary | ICD-10-CM

## 2022-03-18 DIAGNOSIS — Z3A38 38 weeks gestation of pregnancy: Secondary | ICD-10-CM | POA: Diagnosis not present

## 2022-03-18 LAB — COMPREHENSIVE METABOLIC PANEL
ALT: 17 U/L (ref 0–44)
AST: 25 U/L (ref 15–41)
Albumin: 1.9 g/dL — ABNORMAL LOW (ref 3.5–5.0)
Alkaline Phosphatase: 97 U/L (ref 38–126)
Anion gap: 9 (ref 5–15)
BUN: 16 mg/dL (ref 6–20)
CO2: 17 mmol/L — ABNORMAL LOW (ref 22–32)
Calcium: 8.2 mg/dL — ABNORMAL LOW (ref 8.9–10.3)
Chloride: 106 mmol/L (ref 98–111)
Creatinine, Ser: 0.94 mg/dL (ref 0.44–1.00)
GFR, Estimated: >60 mL/min (ref >60)
Glucose, Bld: 219 mg/dL — ABNORMAL HIGH (ref 70–99)
Potassium: 4.4 mmol/L (ref 3.5–5.1)
Sodium: 132 mmol/L — ABNORMAL LOW (ref 135–145)
Total Bilirubin: 0.4 mg/dL (ref 0.3–1.2)
Total Protein: 6 g/dL — ABNORMAL LOW (ref 6.5–8.1)

## 2022-03-18 LAB — CBC
HCT: 33.2 % — ABNORMAL LOW (ref 36.0–46.0)
Hemoglobin: 11 g/dL — ABNORMAL LOW (ref 12.0–15.0)
MCH: 27.8 pg (ref 26.0–34.0)
MCHC: 33.1 g/dL (ref 30.0–36.0)
MCV: 83.8 fL (ref 80.0–100.0)
Platelets: 279 10*3/uL (ref 150–400)
RBC: 3.96 MIL/uL (ref 3.87–5.11)
RDW: 13.4 % (ref 11.5–15.5)
WBC: 12.6 10*3/uL — ABNORMAL HIGH (ref 4.0–10.5)
nRBC: 0 % (ref 0.0–0.2)

## 2022-03-18 LAB — POCT CBG MONITORING
CBG: 171
CBG: 183

## 2022-03-18 LAB — GLUCOSE, CAPILLARY
Glucose-Capillary: 175 mg/dL — ABNORMAL HIGH (ref 70–99)
Glucose-Capillary: 151 mg/dL — ABNORMAL HIGH (ref 70–99)
Glucose-Capillary: 142 mg/dL — ABNORMAL HIGH (ref 70–99)
Glucose-Capillary: 121 mg/dL — ABNORMAL HIGH (ref 70–99)

## 2022-03-18 LAB — PROTEIN / CREATININE RATIO, URINE
Creatinine, Urine: 90 mg/dL
Protein Creatinine Ratio: 9.39 mg/mg{Cre} — ABNORMAL HIGH (ref 0.00–0.15)
Total Protein, Urine: 845 mg/dL

## 2022-03-18 LAB — TYPE AND SCREEN
ABO/RH(D): O POS
Antibody Screen: NEGATIVE

## 2022-03-18 MED ORDER — LABETALOL HCL 5 MG/ML IV SOLN
20.0000 mg | INTRAVENOUS | Status: DC | PRN
  Administered 2022-03-18 – 2022-03-31 (×9): 20 mg via INTRAVENOUS
  Filled 2022-03-18 (×10): qty 4

## 2022-03-18 MED ORDER — LACTATED RINGERS IV SOLN: INTRAVENOUS | Status: DC

## 2022-03-18 MED ORDER — METOPROLOL TARTRATE 25 MG PO TABS: 25.0000 mg | ORAL_TABLET | Freq: Two times a day (BID) | ORAL | 2 refills | Status: DC

## 2022-03-18 MED ORDER — COMPLETENATE 29-1 MG PO CHEW: 1.0000 | CHEWABLE_TABLET | Freq: Every day | ORAL | Status: DC

## 2022-03-18 MED ORDER — HYDROXYZINE HCL 50 MG PO TABS
50.0000 mg | ORAL_TABLET | Freq: Three times a day (TID) | ORAL | Status: DC | PRN
  Administered 2022-03-18: 50 mg via ORAL
  Filled 2022-03-18: qty 1

## 2022-03-18 MED ORDER — HYDRALAZINE HCL 20 MG/ML IJ SOLN
10.0000 mg | INTRAMUSCULAR | Status: DC | PRN
  Administered 2022-03-21: 10 mg via INTRAVENOUS
  Filled 2022-03-18: qty 1

## 2022-03-18 MED ORDER — INSULIN ASPART PROT & ASPART (70-30 MIX) 100 UNIT/ML ~~LOC~~ SUSP: 7.0000 [IU] | Freq: Three times a day (TID) | SUBCUTANEOUS | Status: DC

## 2022-03-18 MED ORDER — CALCIUM CARBONATE ANTACID 500 MG PO CHEW
2.0000 | CHEWABLE_TABLET | ORAL | Status: DC | PRN
  Administered 2022-03-21 – 2022-03-23 (×2): 400 mg via ORAL
  Filled 2022-03-18 (×2): qty 2

## 2022-03-18 MED ORDER — INSULIN REGULAR(HUMAN) IN NACL 100-0.9 UT/100ML-% IV SOLN: INTRAVENOUS | Status: DC

## 2022-03-18 MED ORDER — MAGNESIUM SULFATE 40 GM/1000ML IV SOLN
INTRAVENOUS | Status: AC
  Administered 2022-03-18: 2 g via INTRAVENOUS
  Filled 2022-03-18: qty 1000

## 2022-03-18 MED ORDER — DEXTROSE 50 % IV SOLN: 0.0000 mL | INTRAVENOUS | Status: DC | PRN

## 2022-03-18 MED ORDER — LABETALOL HCL 5 MG/ML IV SOLN
40.0000 mg | INTRAVENOUS | Status: DC | PRN
  Administered 2022-03-18 – 2022-03-30 (×4): 40 mg via INTRAVENOUS
  Filled 2022-03-18 (×6): qty 8

## 2022-03-18 MED ORDER — INSULIN GLARGINE-YFGN 100 UNIT/ML ~~LOC~~ SOLN: 20.0000 [IU] | Freq: Every day | SUBCUTANEOUS | 11 refills | Status: DC

## 2022-03-18 MED ORDER — PRENATAL MULTIVITAMIN CH
1.0000 | ORAL_TABLET | Freq: Every day | ORAL | Status: DC
  Administered 2022-03-18 – 2022-03-25 (×6): 1 via ORAL
  Filled 2022-03-18 (×8): qty 1

## 2022-03-18 MED ORDER — INSULIN ASPART 100 UNIT/ML IJ SOLN: 0.0000 [IU] | Freq: Every day | INTRAMUSCULAR | Status: DC

## 2022-03-18 MED ORDER — ONDANSETRON HCL 4 MG/2ML IJ SOLN
4.0000 mg | INTRAMUSCULAR | Status: DC | PRN
  Administered 2022-03-18: 4 mg via INTRAVENOUS
  Filled 2022-03-18: qty 2

## 2022-03-18 MED ORDER — DEXTROSE IN LACTATED RINGERS 5 % IV SOLN
INTRAVENOUS | Status: DC
  Administered 2022-03-20: 125 mL/h via INTRAVENOUS

## 2022-03-18 MED ORDER — INSULIN ASPART 100 UNIT/ML IJ SOLN: 0.0000 [IU] | Freq: Three times a day (TID) | INTRAMUSCULAR | Status: DC

## 2022-03-18 MED ORDER — INSULIN GLARGINE-YFGN 100 UNIT/ML ~~LOC~~ SOLN
20.0000 [IU] | Freq: Every day | SUBCUTANEOUS | Status: DC
  Filled 2022-03-18: qty 0.2

## 2022-03-18 MED ORDER — ACETAMINOPHEN 325 MG PO TABS
650.0000 mg | ORAL_TABLET | ORAL | Status: DC | PRN
  Administered 2022-03-18 – 2022-03-20 (×3): 650 mg via ORAL
  Filled 2022-03-18 (×3): qty 2

## 2022-03-18 MED ORDER — INSULIN LISPRO (1 UNIT DIAL) 100 UNIT/ML (KWIKPEN): 7.0000 [IU] | PEN_INJECTOR | Freq: Three times a day (TID) | SUBCUTANEOUS | Status: DC

## 2022-03-18 MED ORDER — INSULIN ASPART 100 UNIT/ML IJ SOLN
0.0000 [IU] | Freq: Three times a day (TID) | INTRAMUSCULAR | Status: DC
  Administered 2022-03-18: 2 [IU] via SUBCUTANEOUS
  Filled 2022-03-18: qty 1

## 2022-03-18 MED ORDER — INSULIN ASPART 100 UNIT/ML IJ SOLN: 0.0000 [IU] | Freq: Every day | INTRAMUSCULAR | 11 refills | Status: DC

## 2022-03-18 MED ORDER — INSULIN GLARGINE-YFGN 100 UNIT/ML ~~LOC~~ SOLN: 40.0000 [IU] | Freq: Every day | SUBCUTANEOUS | Status: DC

## 2022-03-18 MED ORDER — MAGNESIUM SULFATE 2 GM/50ML IV SOLN
2.0000 g | INTRAVENOUS | Status: DC
  Filled 2022-03-18: qty 50

## 2022-03-18 MED ORDER — NIFEDIPINE 10 MG PO CAPS
10.0000 mg | ORAL_CAPSULE | ORAL | Status: DC | PRN
  Administered 2022-03-18: 10 mg via ORAL
  Filled 2022-03-18: qty 1

## 2022-03-18 MED ORDER — INSULIN REGULAR(HUMAN) IN NACL 100-0.9 UT/100ML-% IV SOLN
INTRAVENOUS | Status: DC
  Administered 2022-03-18: 10.5 [IU]/h via INTRAVENOUS
  Administered 2022-03-19: 2.8 [IU]/h via INTRAVENOUS
  Administered 2022-03-19: 9.5 [IU]/h via INTRAVENOUS
  Filled 2022-03-18 (×3): qty 100

## 2022-03-18 MED ORDER — METFORMIN HCL 850 MG PO TABS
850.0000 mg | ORAL_TABLET | Freq: Two times a day (BID) | ORAL | Status: DC
  Administered 2022-03-18 – 2022-03-25 (×14): 850 mg via ORAL
  Filled 2022-03-18 (×16): qty 1

## 2022-03-18 MED ORDER — METOPROLOL TARTRATE 25 MG PO TABS
25.0000 mg | ORAL_TABLET | Freq: Two times a day (BID) | ORAL | Status: DC
  Administered 2022-03-18 – 2022-03-19 (×2): 25 mg via ORAL
  Filled 2022-03-18 (×5): qty 1

## 2022-03-18 MED ORDER — MAGNESIUM SULFATE 4 GM/100ML IV SOLN
4.0000 g | Freq: Once | INTRAVENOUS | Status: AC
  Administered 2022-03-18: 4 g via INTRAVENOUS
  Filled 2022-03-18: qty 100

## 2022-03-18 MED ORDER — BETAMETHASONE SOD PHOS & ACET 6 (3-3) MG/ML IJ SUSP
12.0000 mg | Freq: Once | INTRAMUSCULAR | Status: AC
  Administered 2022-03-19: 12 mg via INTRAMUSCULAR
  Filled 2022-03-18: qty 5

## 2022-03-18 MED ORDER — INSULIN ASPART 100 UNIT/ML IJ SOLN: 7.0000 [IU] | Freq: Three times a day (TID) | INTRAMUSCULAR | Status: DC

## 2022-03-18 MED ORDER — BETAMETHASONE SOD PHOS & ACET 6 (3-3) MG/ML IJ SUSP: 12.0000 mg | Freq: Once | INTRAMUSCULAR | Status: AC

## 2022-03-18 MED ORDER — INSULIN ASPART 100 UNIT/ML IJ SOLN: 0.0000 [IU] | Freq: Three times a day (TID) | INTRAMUSCULAR | 11 refills | Status: DC

## 2022-03-18 MED ORDER — LABETALOL HCL 5 MG/ML IV SOLN
80.0000 mg | INTRAVENOUS | Status: DC | PRN
  Administered 2022-03-21: 80 mg via INTRAVENOUS
  Administered 2022-03-30: 40 mg via INTRAVENOUS
  Filled 2022-03-18 (×2): qty 16

## 2022-03-18 MED ORDER — INSULIN GLARGINE-YFGN 100 UNIT/ML ~~LOC~~ SOLN: 20.0000 [IU] | Freq: Every day | SUBCUTANEOUS | Status: DC

## 2022-03-18 MED ORDER — BETAMETHASONE SOD PHOS & ACET 6 (3-3) MG/ML IJ SUSP
INTRAMUSCULAR | Status: AC
  Administered 2022-03-18: 12 mg via INTRAMUSCULAR
  Filled 2022-03-18: qty 5

## 2022-03-18 MED ORDER — FUROSEMIDE 10 MG/ML IJ SOLN
20.0000 mg | Freq: Once | INTRAMUSCULAR | Status: AC
  Administered 2022-03-18: 20 mg via INTRAVENOUS
  Filled 2022-03-18: qty 2

## 2022-03-18 MED ORDER — MAGNESIUM SULFATE 2 GM/50ML IV SOLN: 2.0000 g | INTRAVENOUS | Status: DC

## 2022-03-18 MED ORDER — LACTATED RINGERS IV SOLN: 10.0000 mL | INTRAVENOUS | 0 refills | Status: DC

## 2022-03-18 MED ORDER — METOPROLOL TARTRATE 50 MG PO TABS
25.0000 mg | ORAL_TABLET | Freq: Two times a day (BID) | ORAL | Status: DC
  Administered 2022-03-18 (×2): 25 mg via ORAL
  Filled 2022-03-18: qty 0.5

## 2022-03-18 MED ORDER — DOCUSATE SODIUM 100 MG PO CAPS
100.0000 mg | ORAL_CAPSULE | Freq: Every day | ORAL | Status: DC
  Administered 2022-03-19 – 2022-03-22 (×2): 100 mg via ORAL
  Filled 2022-03-18 (×10): qty 1

## 2022-03-18 MED ORDER — PRENATAL ADULT GUMMY/DHA/FA 0.4-25 MG PO CHEW: 1.0000 | CHEWABLE_TABLET | Freq: Every day | ORAL | Status: DC

## 2022-03-18 MED ORDER — METFORMIN HCL 850 MG PO TABS
850.0000 mg | ORAL_TABLET | Freq: Two times a day (BID) | ORAL | Status: DC
  Administered 2022-03-18: 850 mg via ORAL
  Filled 2022-03-18: qty 1

## 2022-03-18 MED ORDER — ASPIRIN 81 MG PO TBEC
81.0000 mg | DELAYED_RELEASE_TABLET | Freq: Every day | ORAL | Status: DC
  Administered 2022-03-18 – 2022-03-30 (×13): 81 mg via ORAL
  Filled 2022-03-18 (×13): qty 1

## 2022-03-18 MED ORDER — LACTATED RINGERS IV SOLN: 125.0000 mL/h | INTRAVENOUS | Status: AC

## 2022-03-18 MED ORDER — MAGNESIUM SULFATE 40 GM/1000ML IV SOLN
2.0000 g/h | INTRAVENOUS | Status: DC
  Administered 2022-03-18: 2 g/h via INTRAVENOUS
  Filled 2022-03-18: qty 1000

## 2022-03-18 MED ORDER — NIFEDIPINE ER OSMOTIC RELEASE 60 MG PO TB24
90.0000 mg | ORAL_TABLET | Freq: Every day | ORAL | Status: DC
  Administered 2022-03-18 – 2022-03-31 (×14): 90 mg via ORAL
  Filled 2022-03-18 (×14): qty 1

## 2022-03-18 MED ORDER — ZOLPIDEM TARTRATE 5 MG PO TABS: 5.0000 mg | ORAL_TABLET | Freq: Every evening | ORAL | Status: DC | PRN

## 2022-03-18 NOTE — Progress Notes (Addendum)
Patient with multiple severe range BPs overnight. Complete course of IV Labetalol protocol given. Additional PO dose of IR procardia 10 mg given. She has complaint of having difficulty "getting a breath". She reports chest pain to RN and denies chest pain to me. She has a history of anxiety with panic attack. She reports a good amount of urine output since having lasix. Will measure future output. Pulse ox on and sats are 100%. I spoke with Dr Amalia Hailey regarding all of this. He called on-call MFM [place] who recommends: EKG, Echo, Chest x-ray, course of BMZ, IV magnesium, lasix and transfer to [facility] faculty practice. They will then consult with nephrology. Orders placed and transfer process initiated.  Christean Leaf, CNM

## 2022-03-18 NOTE — Discharge Summary (Cosign Needed Addendum)
Physician Final Progress Note  Patient ID: Jane Schultz MRN: [id number] DOB/AGE: July 31, 1980 41 y.o.  Admit date: 03/17/2022 Admitting provider: Rod Can, CNM Discharge date: 03/18/2022   Admission Diagnoses:  1) intrauterine pregnancy at [redacted]w[redacted]d 2) lower extremity  swelling   Discharge Diagnoses:  Principal Problem:   Elevated blood pressure complicating pregnancy, antepartum, second trimester  Severe range blood pressures Nephrotic syndrome versus preeclampsia  Lower extremity swelling   History of Present Illness: Jane JConkeyis a 41y.o. [id number] at 271w0dated by 12 week ultrasound.  Her pregnancy has been complicated by AMA, obesity, diabetes on medication, chronic hypertension on medication, deaf .     She denies contractions.   She denies leakage of fluid.   She denies vaginal bleeding.   She reports fetal movement.     She reports bilateral swelling in her legs that has become increasingly uncomfortable. She is unable to check her blood pressure at home. She denies headache, visual changes or epigastric pain on admission to observation. She did complain of headache while in observation which was relieved by tylenol. She reports taking her BP medications as prescribed.   Past Medical History:  Diagnosis Date   Asthma    Deaf    Diabetes mellitus without complication ([place]   Hypertension     Past Surgical History:  Procedure Laterality Date   CESAREAN SECTION     EXCISION VAGINAL CYST N/A 05/13/2021   Procedure: Incision and Drainage;  Surgeon: ChRubie MaidMD;  Location: [facility] ORS;  Service: Gynecology;  Laterality: N/A;    No current facility-administered medications on file prior to encounter.   Current Outpatient Medications on File Prior to Encounter  Medication Sig Dispense Refill   aspirin EC 81 MG tablet Take 1 tablet (81 mg total) by mouth daily. Take after 12 weeks for prevention of preeclampssia later in pregnancy  300 tablet 2   furosemide (LASIX) 20 MG tablet Take 1 tablet (20 mg total) by mouth every other day. 2 tablet 0   insulin glargine (LANTUS SOLOSTAR) 100 UNIT/ML Solostar Pen Inject 40 Units into the skin daily. 45 mL 2   insulin lispro (HUMALOG) 100 UNIT/ML KwikPen Inject 7 Units into the skin 3 (three) times daily. 20 mL 3   metFORMIN (GLUCOPHAGE) 850 MG tablet Take 1 tablet (850 mg total) by mouth 2 (two) times daily with a meal. 180 tablet 3   NIFEdipine (PROCARDIA XL/NIFEDICAL-XL) 90 MG 24 hr tablet Take 1 tablet (90 mg total) by mouth daily. 30 tablet 5   Prenatal MV & Min w/FA-DHA (PRENATAL ADULT GUMMY/DHA/FA) 0.4-25 MG CHEW Chew 1 each by mouth daily. 30 tablet 3   blood glucose meter kit and supplies KIT Dispense based on patient and insurance preference. Use up to four times daily as directed. (FOR ICD-9 250.00, 250.01). 1 each 0   Blood Pressure Monitoring (BLOOD PRESSURE CUFF) MISC 1 kit by Does not apply route daily. 1 each 0   Continuous Blood Gluc Receiver (DEXCOM G6 RECEIVER) DEVI Check blood sugars 4 x daily (fasting and 2 hours postprandial) 1 each 0   Continuous Blood Gluc Sensor (DEXCOM G6 SENSOR) MISC Check blood sugars 4 x daily (fasting and 2 hours postprandial) 3 each 6   Continuous Blood Gluc Transmit (DEXCOM G6 TRANSMITTER) MISC Check blood sugars 4 x daily (fasting and 2 hours postprandial) 1 each 0    No Known Allergies  Social History   Socioeconomic History   Marital status: Significant Other  Spouse name: Not on file   Number of children: Not on file   Years of education: Not on file   Highest education level: Not on file  Occupational History   Not on file  Tobacco Use   Smoking status: Former    Packs/day: 2.00    Types: Cigarettes   Smokeless tobacco: Never  Vaping Use   Vaping Use: Former  Substance and Sexual Activity   Alcohol use: Never   Drug use: Not Currently    Types: Marijuana    Comment: stopped 4 mths ago   Sexual activity: Yes     Birth control/protection: None  Other Topics Concern   Not on file  Social History Narrative   Not on file   Social Determinants of Health   Financial Resource Strain: Not on file  Food Insecurity: Food Insecurity Present (03/18/2022)   Hunger Vital Sign    Worried About Running Out of Food in the Last Year: Sometimes true    Ran Out of Food in the Last Year: Sometimes true  Transportation Needs: Not on file  Physical Activity: Not on file  Stress: Not on file  Social Connections: Not on file  Intimate Partner Violence: Not on file    History reviewed. No pertinent family history.   Review of Systems  Constitutional: Negative.   Respiratory: Negative.    Cardiovascular: Negative.   Gastrointestinal: Negative.   Genitourinary: Negative.   Musculoskeletal: Negative.      Physical Exam: BP (!) 137/90   Pulse 96   Temp 98.1 F (36.7 C)   Resp 18   LMP 09/02/2021 (Approximate)   SpO2 99%   Physical Exam Constitutional:      Appearance: Normal appearance.  Cardiovascular:     Pulses: Normal pulses.     Heart sounds: Normal heart sounds. No murmur heard.    No gallop.  Pulmonary:     Effort: Pulmonary effort is normal.     Breath sounds: Normal breath sounds.  Abdominal:     Tenderness: There is no abdominal tenderness.     Comments: gravid  Musculoskeletal:        General: Swelling present.     Cervical back: Normal range of motion.     Right lower leg: Edema present.     Left lower leg: Edema present.  Neurological:     Mental Status: She is alert.  Skin:    General: Skin is warm.  Psychiatric:        Mood and Affect: Mood normal.   EFM: baseline 145, moderate variability, pos accel (10x100 neg decel)  TOCO: none  Consults:  MFM   Significant Findings/ Diagnostic Studies:  Chest xray: Lung volumes are low. No consolidative airspace disease. No pleural effusions. No pneumothorax. No pulmonary nodule or mass noted. Pulmonary vasculature and the  cardiomediastinal silhouette are within normal limits.  UPC 9.39 EKG WNL    Procedures: RNST   Hospital Course: The patient was admitted to Labor and Delivery Triage for observation. She was found to have severe range blood pressure.  She received IV Labetalol x3 and IV hydralazine x1  She reported to the RN that she had chest pain, EKG and chest x-ray were WNL, echo ordered but then deferred.  The pt denied chest pain to the off going cnm and this cnm. Magnesium Sulfate  was started, she received 1 dose of betamethasone. She was given SQ insulin around the time of her morning meal. Her UPC was found to  be 9.39 (on 10/26 it was 6.07). Given the complexity if this pt's medical history MFM was consulted. The plan was made to transfer her to [facility] for a higher level of care. All calls were made. The pt consented to transfer then got concerned that her partner would not be able to go with her. The pt's partner was consented to travel in the ambulance. The pt was then transported out with her partner by [facility] service.   Discharge Condition: stable  Disposition: fair  Diet: Diabetic diet  Discharge Activity: see transfer orders    Allergies as of 03/18/2022   No Known Allergies      Medication List     TAKE these medications    aspirin EC 81 MG tablet Take 1 tablet (81 mg total) by mouth daily. Take after 12 weeks for prevention of preeclampssia later in pregnancy   blood glucose meter kit and supplies Kit Dispense based on patient and insurance preference. Use up to four times daily as directed. (FOR ICD-9 250.00, 250.01).   Blood Pressure Cuff Misc 1 kit by Does not apply route daily.   Dexcom G6 Receiver Devi Check blood sugars 4 x daily (fasting and 2 hours postprandial)   Dexcom G6 Sensor Misc Check blood sugars 4 x daily (fasting and 2 hours postprandial)   Dexcom G6 Transmitter Misc Check blood sugars 4 x daily (fasting and 2 hours postprandial)    furosemide 20 MG tablet Commonly known as: Lasix Take 1 tablet (20 mg total) by mouth every other day.   insulin aspart 100 UNIT/ML injection Commonly known as: novoLOG Inject 0-5 Units into the skin at bedtime.   insulin aspart 100 UNIT/ML injection Commonly known as: novoLOG Inject 0-9 Units into the skin 3 (three) times daily with meals.   insulin glargine-yfgn 100 UNIT/ML injection Commonly known as: SEMGLEE Inject 0.2 mLs (20 Units total) into the skin at bedtime.   insulin lispro 100 UNIT/ML KwikPen Commonly known as: HUMALOG Inject 7 Units into the skin 3 (three) times daily.   lactated ringers infusion Inject 10 mLs into the vein continuous.   Lantus SoloStar 100 UNIT/ML Solostar Pen Generic drug: insulin glargine Inject 40 Units into the skin daily.   magnesium sulfate 2 GM/50ML Soln infusion Inject 50 mLs (2 g total) into the vein continuous.   metFORMIN 850 MG tablet Commonly known as: GLUCOPHAGE Take 1 tablet (850 mg total) by mouth 2 (two) times daily with a meal.   metoprolol tartrate 25 MG tablet Commonly known as: LOPRESSOR Take 1 tablet (25 mg total) by mouth 2 (two) times daily.   NIFEdipine 90 MG 24 hr tablet Commonly known as: PROCARDIA XL/NIFEDICAL-XL Take 1 tablet (90 mg total) by mouth daily.   Prenatal Adult Gummy/DHA/FA 0.4-25 MG Chew Chew 1 each by mouth daily.         Total time spent taking care of this patient: 20 minutes  Signed: [place], CNM  03/18/2022, 3:48 PM
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
## 2022-03-18 NOTE — Progress Notes (Signed)
Consult with Dr.Schaible:  1. Uncontrolled cHTN with severe range pressures  2. Nephrotic syndrome vs pre-Eclampsia  3. LE edema  4. SOB (100% by pulse ox)  His recommendation is for transfer to [facility] for medical management, Nephrology consult, MFM services, and possible pre-term delivery.  Plan: Begin transfer process CXR EKG Echo Mg Steroids No further BP management at this time unless pressure worsen then consider Coreg - may need ICU with drip to manage Lasix prn based on CXR

## 2022-03-18 NOTE — Progress Notes (Signed)
[facility] arrived. Transferred to stretcher with assistance. Left floor with [facility] via stretcher. Jane Schultz

## 2022-03-18 NOTE — Progress Notes (Signed)
Jane Schultz is a 41 y.o. G10P0101 female at 43w0ddated by 12 week ultrasound.  Her pregnancy has been complicated by AMA, obesity, diabetes on medication, chronic hypertension on medication, deaf .  Presented to the unit with complaints of significant lower extremity swelling. She was found to have severe range blood pressures.     Subjective:  Pt resting comfortably. Denies SOB or pain. [facility]   ASL interpretor present on tablet.   Objective:   Vitals: Blood pressure (!) 167/75, pulse 86, temperature 98.1 F (36.7 C), resp. rate 18, last menstrual period 09/02/2021, SpO2 98 %. General: NAD Abdomen:Gravid, non tender Lungs: CTAB Heart: RRR, no skips, gallops or mumur Extremities: 2+ BLE, SCD on  Cervical Exam: deferred    FHT: baseline 145, moderate variability, pos accel (10x100 neg decel) Toco:none   Results for orders placed or performed during the hospital encounter of 03/17/22 (from the past 24 hour(s))  Protein / creatinine ratio, urine     Status: Abnormal   Collection Time: 03/17/22  9:33 PM  Result Value Ref Range   Creatinine, Urine 90 mg/dL   Total Protein, Urine 845 mg/dL   Protein Creatinine Ratio 9.39 (H) 0.00 - 0.15 mg/mg[Cre]  CBC     Status: Abnormal   Collection Time: 03/17/22  9:45 PM  Result Value Ref Range   WBC 11.7 (H) 4.0 - 10.5 K/uL   RBC 3.80 (L) 3.87 - 5.11 MIL/uL   Hemoglobin 10.7 (L) 12.0 - 15.0 g/dL   HCT 31.9 (L) 36.0 - 46.0 %   MCV 83.9 80.0 - 100.0 fL   MCH 28.2 26.0 - 34.0 pg   MCHC 33.5 30.0 - 36.0 g/dL   RDW 13.2 11.5 - 15.5 %   Platelets 267 150 - 400 K/uL   nRBC 0.0 0.0 - 0.2 %  Comprehensive metabolic panel     Status: Abnormal   Collection Time: 03/17/22  9:45 PM  Result Value Ref Range   Sodium 135 135 - 145 mmol/L   Potassium 3.7 3.5 - 5.1 mmol/L   Chloride 107 98 - 111 mmol/L   CO2 21 (L) 22 - 32 mmol/L   Glucose, Bld 110 (H) 70 - 99 mg/dL   BUN 15 6 - 20 mg/dL   Creatinine, Ser  0.70 0.44 - 1.00 mg/dL   Calcium 8.2 (L) 8.9 - 10.3 mg/dL   Total Protein 6.5 6.5 - 8.1 g/dL   Albumin 2.1 (L) 3.5 - 5.0 g/dL   AST 20 15 - 41 U/L   ALT 18 0 - 44 U/L   Alkaline Phosphatase 97 38 - 126 U/L   Total Bilirubin 0.5 0.3 - 1.2 mg/dL   GFR, Estimated >60 >60 mL/min   Anion gap 7 5 - 15    Assessment:   41y.o. G2P0101 2100w0dith uncontrolled CHTN with severe ranges blood pressures, nephrotic syndrome vs preeclampsia, LE edema   Plan:   [facility], all phone calls have been made, [facility] called, transportation enroute.   ECHO deferred until transfer complete  Metoprolol '25mg'$  given  at 1012  Continue magnesium sulfate  LyRoberto ScalesCNClarksville City12/04/2021 10:52 AM      LyPollockCNBeaver Damroup  03/19/2022 10:49 AM

## 2022-03-18 NOTE — Inpatient Diabetes Management (Addendum)
Inpatient Diabetes Program Recommendations  Diabetes Treatment Program Recommendations  ADA Standards of Care Diabetes in Pregnancy Target Glucose Ranges:  Fasting: 70 - 95 mg/dL 1 hr postprandial: Less than '140mg'$ /dL (from first bite of meal) 2 hr postprandial: Less than 120 mg/dL (from first bite of meal)    Lab Results  Component Value Date   GLUCAP 171 (H) 02/13/2022   HGBA1C 6.3 (H) 02/10/2022    Review of Glycemic Control  Latest Reference Range & Units 03/18/22 14:23  Glucose 70 - 99 mg/dL 219 (H)  (H): Data is abnormally high Diabetes history: Type 2 DM Outpatient Diabetes medications: Lantus 40 units QD, Lantus 20 units QHS, Metformin 850 mg BID, Humalog 7 units TID Current orders for Inpatient glycemic control: Semglee 40 units QD, Semglee 20 units QHS, Novolog 0-9 units TID & HS, Metformin 850 mg BID, Humalog 7 units TID BMZ x 1(2)  Inpatient Diabetes Program Recommendations:    Current serum glucose 219 mg/dL. Doesn't look like patient received insulin at [facility] prior to transfer.   Consider IV insulin per Endotool- Hyperglycemia- Type 2 DM.  Discussed with Dr Elgie Congo.   Thanks, Bronson Curb, MSN, RNC-OB Diabetes Coordinator [phone] (8a-5p)

## 2022-03-18 NOTE — Progress Notes (Signed)
1200 - report given to [facility] on Specialty Care @ [facility]. Dineen Kid

## 2022-03-18 NOTE — H&P (Signed)
OB History & Physical   History of Present Illness:  Chief Complaint: lower extremity swelling  HPI:  Jane Schultz is a 41 y.o. G75P0101 female at 30w0ddated by 12 week ultrasound.  Her pregnancy has been complicated by AMA, obesity, diabetes on medication, chronic hypertension on medication, deaf .    She denies contractions.   She denies leakage of fluid.   She denies vaginal bleeding.   She reports fetal movement.    She reports bilateral swelling in her legs that has become increasingly uncomfortable. She is unable to check her blood pressure at home. She denies headache, visual changes or epigastric pain on admission to observation. She did complain of headache while in observation which was relieved by tylenol. She reports taking her BP medications as prescribed.  She was admitted for observation and [facility]. BPs range from normal to severe range. IV started and available if needed to treat severe range.   I consulted with Dr EAmalia Haileyregarding plan of care. P/C ratio is elevated and has been previously in the pregnancy. Likely nephrotic syndrome. Observe BPs overnight. Give a dose of IV lasix. Discussed all with patient. Will have MD speak with patient in the morning.   Total weight gain for pregnancy: 15 kg   ASL video interpreter present for all communication  Maternal Medical History:   Past Medical History:  Diagnosis Date   Asthma    Deaf    Diabetes mellitus without complication ([place]    Hypertension     Past Surgical History:  Procedure Laterality Date   CESAREAN SECTION     EXCISION VAGINAL CYST N/A 05/13/2021   Procedure: Incision and Drainage;  Surgeon: CRubie Maid MD;  Location: [facility] ORS;  Service: Gynecology;  Laterality: N/A;    No Known Allergies  Prior to Admission medications   Medication Sig Start Date End Date Taking? Authorizing Provider  aspirin EC 81 MG tablet Take 1 tablet (81 mg total) by mouth daily. Take  after 12 weeks for prevention of preeclampssia later in pregnancy 01/04/22  Yes CRubie Maid MD  furosemide (LASIX) 20 MG tablet Take 1 tablet (20 mg total) by mouth every other day. 03/09/22  Yes CRubie Maid MD  insulin glargine (LANTUS SOLOSTAR) 100 UNIT/ML Solostar Pen Inject 40 Units into the skin daily. 01/04/22  Yes CRubie Maid MD  insulin lispro (HUMALOG) 100 UNIT/ML KwikPen Inject 7 Units into the skin 3 (three) times daily. 01/04/22  Yes CRubie Maid MD  metFORMIN (GLUCOPHAGE) 850 MG tablet Take 1 tablet (850 mg total) by mouth 2 (two) times daily with a meal. 01/04/22  Yes CRubie Maid MD  metoprolol tartrate (LOPRESSOR) 25 MG tablet Take 1 tablet (25 mg total) by mouth 2 (two) times daily. 02/13/22  Yes Dominic, LNunzio Cobbs CNM  NIFEdipine (PROCARDIA XL/NIFEDICAL-XL) 90 MG 24 hr tablet Take 1 tablet (90 mg total) by mouth daily. 02/14/22  Yes Dominic, LNunzio Cobbs CNM  Prenatal MV & Min w/FA-DHA (PRENATAL ADULT GUMMY/DHA/FA) 0.4-25 MG CHEW Chew 1 each by mouth daily. 12/09/21  Yes CRubie Maid MD  blood glucose meter kit and supplies KIT Dispense based on patient and insurance preference. Use up to four times daily as directed. (FOR ICD-9 250.00, 250.01). 05/13/19   SLarene Pickett PA-C  Blood Pressure Monitoring (BLOOD PRESSURE CUFF) MISC 1 kit by Does not apply route daily. 02/13/22   Dominic, LNunzio Cobbs CNM  Continuous Blood Gluc Receiver (DEXCOM G6 RECEIVER) DEVI Check blood sugars 4  x daily (fasting and 2 hours postprandial) 01/29/22   Rubie Maid, MD  Continuous Blood Gluc Sensor (DEXCOM G6 SENSOR) MISC Check blood sugars 4 x daily (fasting and 2 hours postprandial) 02/28/22   Rubie Maid, MD  Continuous Blood Gluc Transmit (DEXCOM G6 TRANSMITTER) MISC Check blood sugars 4 x daily (fasting and 2 hours postprandial) 01/29/22   Rubie Maid, MD    OB History  Gravida Para Term Preterm AB Living  2 1 0 1   1  SAB IAB Ectopic Multiple Live Births               #  Outcome Date GA Lbr Len/2nd Weight Sex Delivery Anes PTL Lv  2 Current           1 Preterm 2007 [redacted]w[redacted]d  M CS-LTranv        Complications: Fetal Intolerance, Breech presentation, Hypertension in pregnancy, pre-eclampsia, severe    Prenatal care site: [facility]  Social History: She  reports that she has quit smoking. Her smoking use included cigarettes. She smoked an average of 2 packs per day. She has never used smokeless tobacco. She reports that she does not currently use drugs after having used the following drugs: Marijuana. She reports that she does not drink alcohol.  Family History: family history is not on file.    Review of Systems:  Review of Systems  Constitutional:  Negative for chills and fever.  HENT:  Negative for congestion, ear discharge, ear pain, hearing loss, sinus pain and sore throat.   Eyes:  Negative for blurred vision and double vision.  Respiratory:  Negative for cough, shortness of breath and wheezing.   Cardiovascular:  Positive for leg swelling. Negative for chest pain and palpitations.  Gastrointestinal:  Negative for abdominal pain, blood in stool, constipation, diarrhea, heartburn, melena, nausea and vomiting.  Genitourinary:  Negative for dysuria, flank pain, frequency, hematuria and urgency.  Musculoskeletal:  Negative for back pain, joint pain and myalgias.  Skin:  Negative for itching and rash.  Neurological:  Negative for dizziness, tingling, tremors, sensory change, speech change, focal weakness, seizures, loss of consciousness, weakness and headaches.  Endo/Heme/Allergies:  Negative for environmental allergies. Does not bruise/bleed easily.  Psychiatric/Behavioral:  Negative for depression, hallucinations, memory loss, substance abuse and suicidal ideas. The patient is not nervous/anxious and does not have insomnia.      Physical Exam:  BP (!) 154/71   Pulse 81   LMP 09/02/2021 (Approximate)   Constitutional: Well nourished, well developed  female in no acute distress.  HEENT: normal Skin: Warm and dry.  Cardiovascular: Regular rate and rhythm.   Extremity: edema 2+ pitting in lower extremities Respiratory: Clear to auscultation bilateral. Normal respiratory effort Abdomen: FHT present Back: no CVAT Neuro: DTRs 2+, Cranial nerves grossly intact Psych: Alert and Oriented x3. No memory deficits. Normal mood and affect.    Latest Reference Range & Units 03/17/22 21:33 03/17/22 21:45  COMPREHENSIVE METABOLIC PANEL   Rpt !  Sodium 135 - 145 mmol/L  135  Potassium 3.5 - 5.1 mmol/L  3.7  Chloride 98 - 111 mmol/L  107  CO2 22 - 32 mmol/L  21 (L)  Glucose 70 - 99 mg/dL  110 (H)  BUN 6 - 20 mg/dL  15  Creatinine 0.44 - 1.00 mg/dL  0.70  Calcium 8.9 - 10.3 mg/dL  8.2 (L)  Anion gap 5 - 15   7  Alkaline Phosphatase 38 - 126 U/L  97  Albumin 3.5 -  5.0 g/dL  2.1 (L)  AST 15 - 41 U/L  20  ALT 0 - 44 U/L  18  Total Protein 6.5 - 8.1 g/dL  6.5  Total Bilirubin 0.3 - 1.2 mg/dL  0.5  GFR, Estimated >60 mL/min  >60  WBC 4.0 - 10.5 K/uL  11.7 (H)  RBC 3.87 - 5.11 MIL/uL  3.80 (L)  Hemoglobin 12.0 - 15.0 g/dL  10.7 (L)  HCT 36.0 - 46.0 %  31.9 (L)  MCV 80.0 - 100.0 fL  83.9  MCH 26.0 - 34.0 pg  28.2  MCHC 30.0 - 36.0 g/dL  33.5  RDW 11.5 - 15.5 %  13.2  Platelets 150 - 400 K/uL  267  nRBC 0.0 - 0.2 %  0.0  Total Protein, Urine mg/dL 845   Protein Creatinine Ratio 0.00 - 0.15 mg/mgCre 9.39 (H)   Creatinine, Urine mg/dL 90   !: Data is abnormal (L): Data is abnormally low (H): Data is abnormally high Rpt: View report in Results Review for more information  Pertinent Results:   Blood type/Rh O positive  Antibody screen negative  Rubella Immune  Varicella Immune    RPR Non-reactive  HBsAg negative  HIV negative  GC negative  Chlamydia negative  Genetic screening Low fetal fraction  1 hour GTT Hgb A1C 8.4  3 hour GTT NA  GBS Not done yet   Baseline FHR: 150 beats/min   Variability: moderate   Accelerations: present  10x10  Decelerations: absent Contractions: absent  Overall assessment: reassuring   Lab Results  Component Value Date   SARSCOV2NAA NEGATIVE 05/06/2021    Assessment:  Jane Schultz is a 41 y.o. G104P0101 female at 29w0dwith lower extremity edema, elevated blood pressure, likely nephrotic syndrome.   Plan:  Admit to Observation CBC, CMP, P/C ratio Severe range BP: start IV, IV labetalol PRN, Metoprolol per home medications IV lasix for 1 dose Diet: carb modified ok Fetal well-being: NST Q shift   JRod Can CNM 03/18/2022 1:26 AM

## 2022-03-18 NOTE — Progress Notes (Signed)
Called to bedside by patient. Patient stated through video interpreter that she had chest pain, shortness of breath, and tightness/intense discomfort in her upper legs. RN called for second RN to alert CNM to come to bedside. CNM promptly arrived to bedside to evaluate patient. Patient then stated to CNM through video interpreter that she did not have chest pain. CNM left to consult with MD. Patient called for RN again, and using the video interpreter stated chest pain and shortness of breath. Pulse Ox applied, pt maintaining O2 saturation of 100%. Vital signs assessed. RN alerted [facility] & CNM. CNM returned to bedside to evaluate. Patient stated through video interpreter that she did not have chest pain, only shortness of breath and the same leg discomfort. CNM updated patient and support person on plan of care. Patient positioned for comfort. Call bell within reach. All questions answered at this time.

## 2022-03-19 ENCOUNTER — Inpatient Hospital Stay (HOSPITAL_COMMUNITY): Payer: Medicaid Other

## 2022-03-19 ENCOUNTER — Inpatient Hospital Stay (HOSPITAL_BASED_OUTPATIENT_CLINIC_OR_DEPARTMENT_OTHER): Payer: Medicaid Other

## 2022-03-19 DIAGNOSIS — O09212 Supervision of pregnancy with history of pre-term labor, second trimester: Secondary | ICD-10-CM

## 2022-03-19 DIAGNOSIS — E669 Obesity, unspecified: Secondary | ICD-10-CM | POA: Diagnosis not present

## 2022-03-19 DIAGNOSIS — O10012 Pre-existing essential hypertension complicating pregnancy, second trimester: Secondary | ICD-10-CM | POA: Diagnosis not present

## 2022-03-19 DIAGNOSIS — O99891 Other specified diseases and conditions complicating pregnancy: Secondary | ICD-10-CM

## 2022-03-19 DIAGNOSIS — Z148 Genetic carrier of other disease: Secondary | ICD-10-CM

## 2022-03-19 DIAGNOSIS — O289 Unspecified abnormal findings on antenatal screening of mother: Secondary | ICD-10-CM

## 2022-03-19 DIAGNOSIS — O09522 Supervision of elderly multigravida, second trimester: Secondary | ICD-10-CM

## 2022-03-19 DIAGNOSIS — Z3A27 27 weeks gestation of pregnancy: Secondary | ICD-10-CM

## 2022-03-19 DIAGNOSIS — O34219 Maternal care for unspecified type scar from previous cesarean delivery: Secondary | ICD-10-CM

## 2022-03-19 DIAGNOSIS — O99212 Obesity complicating pregnancy, second trimester: Secondary | ICD-10-CM | POA: Diagnosis not present

## 2022-03-19 DIAGNOSIS — O09292 Supervision of pregnancy with other poor reproductive or obstetric history, second trimester: Secondary | ICD-10-CM

## 2022-03-19 DIAGNOSIS — Z362 Encounter for other antenatal screening follow-up: Secondary | ICD-10-CM

## 2022-03-19 MED ORDER — LABETALOL HCL 200 MG PO TABS
200.0000 mg | ORAL_TABLET | Freq: Three times a day (TID) | ORAL | Status: DC
  Administered 2022-03-19 – 2022-03-20 (×6): 200 mg via ORAL
  Filled 2022-03-19 (×6): qty 1

## 2022-03-19 NOTE — Consult Note (Addendum)
Renal Service Consult Note [facility]  Jane Schultz 03/19/2022 Sol Blazing, MD Requesting Physician: Dr. Elgie Congo  Reason for Consult: Possible nephrotic syndrome HPI: The patient is a 41 y.o. year-old w/ PMH as below who presented to hospital 12/01 at 27 wks IUP w/ pregnancy complicated by obesity, DM on meds, chronic HTN on meds and deafness. Pt c/o bilat LE swelling, headaches and was admitted for observation and PIH evaluation. Prot/ creat ratio was elevated, as earlier in this pregnancy, and the primary team is wondering whether she has nephrotic syndrome related to her long-term diseases, vs proteinuria related to pregnancy. We are asked to see in this regard.   In hospital relatively well controlled in the 140- 160/ 65- 85 range. At home she was taking metoprolol 25 bid and procardia xl 90 qd. Here is getting procardia 90, metop 25 bid and labetalol was started today at 200 bid.   Pt seen in room. Pt states swelling of the LE's started about 1 month ago.  She stated her pregnancy started around March of this year. Denies any SOB, cough, CP. No hx kidney disease.    ROS - denies CP, no joint pain, no HA, no blurry vision, no rash, no diarrhea, no nausea/ vomiting, no dysuria, no difficulty voiding   Past Medical History  Past Medical History:  Diagnosis Date   Asthma    Deaf    Diabetes mellitus without complication ([place])    Hypertension    Past Surgical History  Past Surgical History:  Procedure Laterality Date   CESAREAN SECTION     EXCISION VAGINAL CYST N/A 05/13/2021   Procedure: Incision and Drainage;  Surgeon: Rubie Maid, MD;  Location: [facility] ORS;  Service: Gynecology;  Laterality: N/A;   Family History History reviewed. No pertinent family history. Social History  reports that she has quit smoking. Her smoking use included cigarettes. She smoked an average of 2 packs per day. She has never used smokeless tobacco. She reports that she does  not currently use drugs after having used the following drugs: Marijuana. She reports that she does not drink alcohol. Allergies No Known Allergies Home medications Prior to Admission medications   Medication Sig Start Date End Date Taking? Authorizing Provider  aspirin EC 81 MG tablet Take 1 tablet (81 mg total) by mouth daily. Take after 12 weeks for prevention of preeclampssia later in pregnancy 01/04/22   Rubie Maid, MD  blood glucose meter kit and supplies KIT Dispense based on patient and insurance preference. Use up to four times daily as directed. (FOR ICD-9 250.00, 250.01). 05/13/19   Larene Pickett, PA-C  Blood Pressure Monitoring (BLOOD PRESSURE CUFF) MISC 1 kit by Does not apply route daily. 02/13/22   Dominic, Nunzio Cobbs, CNM  Continuous Blood Gluc Receiver (DEXCOM G6 RECEIVER) DEVI Check blood sugars 4 x daily (fasting and 2 hours postprandial) 01/29/22   Rubie Maid, MD  Continuous Blood Gluc Sensor (DEXCOM G6 SENSOR) MISC Check blood sugars 4 x daily (fasting and 2 hours postprandial) 02/28/22   Rubie Maid, MD  Continuous Blood Gluc Transmit (DEXCOM G6 TRANSMITTER) MISC Check blood sugars 4 x daily (fasting and 2 hours postprandial) 01/29/22   Rubie Maid, MD  furosemide (LASIX) 20 MG tablet Take 1 tablet (20 mg total) by mouth every other day. 03/09/22   Rubie Maid, MD  insulin aspart (NOVOLOG) 100 UNIT/ML injection Inject 0-5 Units into the skin at bedtime. 03/18/22   Dominic, Nunzio Cobbs, CNM  insulin aspart (  NOVOLOG) 100 UNIT/ML injection Inject 0-9 Units into the skin 3 (three) times daily with meals. 03/18/22   Dominic, Nunzio Cobbs, CNM  insulin glargine (LANTUS SOLOSTAR) 100 UNIT/ML Solostar Pen Inject 40 Units into the skin daily. 01/04/22   Rubie Maid, MD  insulin glargine-yfgn (SEMGLEE) 100 UNIT/ML injection Inject 0.2 mLs (20 Units total) into the skin at bedtime. 03/18/22   Dominic, Nunzio Cobbs, CNM  insulin lispro (HUMALOG) 100 UNIT/ML KwikPen Inject 7 Units  into the skin 3 (three) times daily. 01/04/22   Rubie Maid, MD  lactated ringers infusion Inject 10 mLs into the vein continuous. 03/18/22   Dominic, Nunzio Cobbs, CNM  magnesium sulfate 2 GM/50ML SOLN infusion Inject 50 mLs (2 g total) into the vein continuous. 03/18/22   Dominic, Nunzio Cobbs, CNM  metFORMIN (GLUCOPHAGE) 850 MG tablet Take 1 tablet (850 mg total) by mouth 2 (two) times daily with a meal. 01/04/22   Rubie Maid, MD  metoprolol tartrate (LOPRESSOR) 25 MG tablet Take 1 tablet (25 mg total) by mouth 2 (two) times daily. 03/18/22   Dominic, Nunzio Cobbs, CNM  NIFEdipine (PROCARDIA XL/NIFEDICAL-XL) 90 MG 24 hr tablet Take 1 tablet (90 mg total) by mouth daily. 02/14/22   Dominic, Nunzio Cobbs, CNM  Prenatal MV & Min w/FA-DHA (PRENATAL ADULT GUMMY/DHA/FA) 0.4-25 MG CHEW Chew 1 each by mouth daily. 12/09/21   Rubie Maid, MD     Vitals:   03/19/22 0827 03/19/22 0900 03/19/22 0942 03/19/22 1258  BP: (!) 170/71 (!) 178/74 (!) 149/75 (!) 147/69  Pulse: 77 85 77 79  Resp:    13  Temp:    98 F (36.7 C)  TempSrc:    Oral  SpO2:    96%   Exam Gen alert, no distress, deaf No rash, cyanosis or gangrene Sclera anicteric, throat clear  No jvd or bruits Chest clear bilat to bases, no rales/ wheezing RRR no MRG Abd soft ntnd no mass or ascites +bs GU defer MS no joint effusions or deformity Ext diffuse 2+ pitting bilat LE edema, no other edema Neuro is alert, Ox 3 , deaf, nonfocal     Home meds include - aspirin, lasix 20 qod, insulin aspart/ glargine/ lispro, metformin, metoprolol 25 bid, procardia xl 90 qd, MVI, vits/ supps/ prns        Date UA prot dip   UP/C ratio    24 urine prot      01/06/19  30      05/13/19 30      05/05/21  > 300      12/09/21 3+      02/05/22 > 300       02/10/22    6.07             02/11/22     4,358 mg prot       03/17/22   9.39             UA 03/09/22 - 3+ protein, o/w neg    UA here - pending    UP/C ratios >>    - 02/10/22 = 6.07              - 03/17/22 = 9.39       24 hr urine (02/11/22) >> 4358 mg per 24 hrs      Na 132  K 4.4  CO2 17  BUN 16  creat 0.94       Assessment/ Plan: Nephrotic-range proteinuria - in a pt w/ 27 IUP  admitted for LE swelling and worsening HTN. Pt has had DM since age 47, and HTN for years. Suspect her proteinuria predated this pregnancy and has worsened because of the pregnancy, which is expected in pts w/ underlying CKD / proteinuria (amount of proteinuria in patient's w/ underlying CKD/ neph syndrome typically doubles during a pregnancy). 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  Pt has 23 yr hx of DM. UA not consistent w/ GN but will send of some serologies (HIV, hep C, lupus) nonetheless. Kidney biopsy is typically not done during pregnancy. As for management, typically local measures are enough to help edema in pregnant nephrotic syndrome pts - elevating legs during the day, using compression hose and moderate lowering of sodium intake. Diuretics are not usually used, but if not responding to local measures, loop diuretics appear to be safe and could be considered. Finally, the issue of anticoagulation should be considered. Pregnant patients w/ nephrotic syndrome in general are at increased risk of VTE and may benefit from prophylactic anticoagulation. Per UTD if albumin is low (yes) and bleeding risk is low -to- intermediate (hers is low) then anticoagulation w/ LMWH (lovenox) could be indicated. Will defer to primary team about anticoagulation - if proceeding to anticoagulate would consult pharmacy for their assistance.  Will follow.  HTN - agree w/ procardia xl.  Metoprolol probably should be stopped w/ labetalol added in. Labetalol can be titrated up to 400 tid if needed.  BP's a bit better.       Rob Kiora Hallberg  MD 03/19/2022, 2:35 PM Recent Labs  Lab 03/17/22 2145 03/18/22 1423  HGB 10.7* 11.0*  ALBUMIN 2.1* 1.9*  CALCIUM 8.2* 8.2*  CREATININE 0.70 0.94  K 3.7 4.4    Inpatient medications:  aspirin EC  81 mg Oral Daily   docusate sodium  100 mg Oral Daily   labetalol  200 mg Oral TID   metFORMIN  850 mg Oral BID WC   metoprolol tartrate  25 mg Oral BID   NIFEdipine  90 mg Oral Daily   prenatal multivitamin  1 tablet Oral Q1200    dextrose 5% lactated ringers 125 mL/hr at 03/19/22 1100   insulin 11.5 Units/hr (03/19/22 1406)   lactated ringers     lactated ringers     lactated ringers     magnesium sulfate 2 g/hr (03/18/22 2327)   acetaminophen, calcium carbonate, dextrose, labetalol **AND** labetalol **AND** labetalol **AND** hydrALAZINE **AND** Measure blood pressure, hydrOXYzine, ondansetron (ZOFRAN) IV

## 2022-03-19 NOTE — Consult Note (Signed)
Asked by Dr.Bass to provide prenatal consultation for this  41 y.o.  G2P0101 mother who is now 57w1dwith her pregnancy complicated by pre-eclampsia superimposed on chronic HTN; also with type 2 DM . She has received betamethasone x 2 is being treated with antihypertensives; also had neprhology consultation for possible nephrotic syndrome.  Discussed with patient and FOB with help of in-person ASL interpreter.(Both are significantly hearing impaired)  Addressed usual expectations for preterm infant at 27-[redacted] weeks gestation, including possible needs for DR resuscitation, respiratory support, and IV access.  Explained family-centered care approach including parent participation in rounds, decision-making, and also presented usual criteria for discharge. [facility] [facility]  Discussed advantages of feeding with mother's milk and possible use of donor milk as "bridge" if needed until her supply is sufficient. She plans to pump post partum.  Patient and FOB were attentive, had appropriate questions, and expressed appreciation for my input.  Thank you for consulting Neonatology.  Total time 45 minutes, face-to-face time 25 minutes  JWimmer, MD

## 2022-03-19 NOTE — Plan of Care (Signed)
  Problem: Education: Goal: Knowledge of General Education information will improve Description: Including pain rating scale, medication(s)/side effects and non-pharmacologic comfort measures Outcome: Progressing   Problem: Activity: Goal: Risk for activity intolerance will decrease Outcome: Progressing   Problem: Education: Goal: Knowledge of disease or condition will improve Outcome: Progressing   Problem: Coping: Goal: Level of anxiety will decrease Outcome: Completed/Met   Problem: Elimination: Goal: Will not experience complications related to urinary retention Outcome: Completed/Met   Problem: Coping: Goal: Ability to adjust to condition or change in health will improve Outcome: Completed/Met

## 2022-03-19 NOTE — Progress Notes (Addendum)
Legs elevated above the heart x 30 minutes at 1930 as ordered.  Patient tolerated well.

## 2022-03-19 NOTE — Progress Notes (Signed)
FACULTY PRACTICE ANTEPARTUM PROGRESS NOTE  Jane Schultz is a 41 y.o. G2P0101 at 80w1dwho is admitted for preeclampsia, chronic hypertension, type 2 DM.  Estimated Date of Delivery: 06/17/22 Fetal presentation is unsure.  Length of Stay:  1 Days. Admitted 03/18/2022  Subjective: Pt seen this AM.  She was very aggressive and noncompliant because she was on clear liquids while still on magnesium sulfate.  Once mag turned off and she was told she could have a soft diet, her mood improved Patient reports normal fetal movement.  She denies uterine contractions, denies bleeding and leaking of fluid per vagina.  Vitals:  Blood pressure (!) 149/75, pulse 77, temperature 98.4 F (36.9 C), temperature source Oral, resp. rate 15, last menstrual period 09/02/2021, SpO2 98 %. Physical Examination: CONSTITUTIONAL: Well-developed, well-nourished female in no acute distress.  HENT:  Normocephalic, atraumatic, External right and left ear normal. Oropharynx is clear and moist EYES: Conjunctivae and EOM are normal.  NECK: Normal range of motion, supple, no masses. SKIN: Skin is warm and dry. No rash noted. Not diaphoretic. No erythema. No pallor. [place] Alert and oriented to person, place, and time. Normal reflexes, muscle tone coordination. No cranial nerve deficit noted. PSYCHIATRIC: Normal mood and affect. Normal behavior. Normal judgment and thought content. CARDIOVASCULAR: Normal heart rate noted, regular rhythm RESPIRATORY: Effort and breath sounds normal, no problems with respiration noted MUSCULOSKELETAL: Normal range of motion. No edema and no tenderness. ABDOMEN: Soft, nontender, nondistended, gravid. CERVIX: deferred  Fetal monitoring: FHR: 130w bpm, Variability: moderate, Accelerations: Present, Decelerations: Absent  Uterine activity: none  Results for orders placed or performed during the hospital encounter of 03/18/22 (from the past 48 hour(s))  Comprehensive metabolic panel      Status: Abnormal   Collection Time: 03/18/22  2:23 PM  Result Value Ref Range   Sodium 132 (L) 135 - 145 mmol/L   Potassium 4.4 3.5 - 5.1 mmol/L    Comment: HEMOLYSIS AT THIS LEVEL MAY AFFECT RESULT   Chloride 106 98 - 111 mmol/L   CO2 17 (L) 22 - 32 mmol/L   Glucose, Bld 219 (H) 70 - 99 mg/dL    Comment: Glucose reference range applies only to samples taken after fasting for at least 8 hours.   BUN 16 6 - 20 mg/dL   Creatinine, Ser 0.94 0.44 - 1.00 mg/dL   Calcium 8.2 (L) 8.9 - 10.3 mg/dL   Total Protein 6.0 (L) 6.5 - 8.1 g/dL   Albumin 1.9 (L) 3.5 - 5.0 g/dL   AST 25 15 - 41 U/L    Comment: HEMOLYSIS AT THIS LEVEL MAY AFFECT RESULT   ALT 17 0 - 44 U/L    Comment: HEMOLYSIS AT THIS LEVEL MAY AFFECT RESULT   Alkaline Phosphatase 97 38 - 126 U/L   Total Bilirubin 0.4 0.3 - 1.2 mg/dL    Comment: HEMOLYSIS AT THIS LEVEL MAY AFFECT RESULT   GFR, Estimated >60 >60 mL/min    Comment: (NOTE) Calculated using the CKD-EPI Creatinine Equation (2021)    Anion gap 9 5 - 15    Comment: Performed at [facility] [address], [id number] CBC     Status: Abnormal   Collection Time: 03/18/22  2:23 PM  Result Value Ref Range   WBC 12.6 (H) 4.0 - 10.5 K/uL   RBC 3.96 3.87 - 5.11 MIL/uL   Hemoglobin 11.0 (L) 12.0 - 15.0 g/dL   HCT 33.2 (L) 36.0 - 46.0 %  MCV 83.8 80.0 - 100.0 fL   MCH 27.8 26.0 - 34.0 pg   MCHC 33.1 30.0 - 36.0 g/dL   RDW 13.4 11.5 - 15.5 %   Platelets 279 150 - 400 K/uL   nRBC 0.0 0.0 - 0.2 %    Comment: Performed at [facility], [place] [address] [place], [place] [address]  Type and screen [place]     Status: None   Collection Time: 03/18/22  2:23 PM  Result Value Ref Range   ABO/RH(D) O POS    Antibody Screen NEG    Sample Expiration      03/21/2022,2359 Performed at [facility], [place] [address] [place], [place] [address]   Glucose, capillary     Status: Abnormal   Collection Time: 03/18/22  6:44 PM   Result Value Ref Range   Glucose-Capillary 175 (H) 70 - 99 mg/dL    Comment: Glucose reference range applies only to samples taken after fasting for at least 8 hours.  Glucose, capillary     Status: Abnormal   Collection Time: 03/18/22  7:51 PM  Result Value Ref Range   Glucose-Capillary 151 (H) 70 - 99 mg/dL    Comment: Glucose reference range applies only to samples taken after fasting for at least 8 hours.  Glucose, capillary     Status: Abnormal   Collection Time: 03/18/22  9:03 PM  Result Value Ref Range   Glucose-Capillary 142 (H) 70 - 99 mg/dL    Comment: Glucose reference range applies only to samples taken after fasting for at least 8 hours.  Glucose, capillary     Status: Abnormal   Collection Time: 03/18/22 10:08 PM  Result Value Ref Range   Glucose-Capillary 121 (H) 70 - 99 mg/dL    Comment: Glucose reference range applies only to samples taken after fasting for at least 8 hours.    I have reviewed the patient's current medications.  ASSESSMENT: Principal Problem:   Elevated blood pressure complicating pregnancy, antepartum, second trimester Active Problems:   Type 2 diabetes mellitus without complication [facility])   Deaf   Supervision of high risk pregnancy in second trimester   Chronic hypertension affecting pregnancy   Multigravida of advanced maternal age in second trimester   History of cesarean delivery   PLAN: Preeclampsia/CHTN:   -pt has had to receive IV labetalol for control already today, will add po labetalol 200 mgTID - pt now s/p BMZ and mag, magnesium now discontinued and will remove foley catheter -NST BID - monitor BP control, if uncontrolled with 1-2 maxed out antihypertensives, may need to proceed to delivery -Discuss delivery route with patient.  2.  Type 2 diabetes: -  remain on endotool until 12/3 due to recent steroid administration -appreciate diabetic coordinator assistance  3. Hx of cesarean -pt will need detailed discussion of VBAC  and consent signed -she will need to understand if preeclampsia becomes severe and she is remote from delivery, she may need a repeat c section  4. Proteinuria -nephrology consult ordered due to long standing proteinuria ie is this from preeclampsia   Continue routine antenatal care.   Lynnda Shields, MD [place] Attending, Center for [facility] 03/19/2022 10:16 AM

## 2022-03-19 NOTE — Inpatient Diabetes Management (Addendum)
ADA Standards of Care 2023 Diabetes in Pregnancy Target Glucose Ranges:  Fasting: 70 - 95 mg/dL 1 hr postprandial:  110 - '140mg'$ /dL (from first bite of meal) 2 hr postprandial:  100 - 120 mg/dL (from first bit of meal)    CGM readings since 11:15pm last night: 127-112-97-98-101-99   Hx: DM2  Home DM Meds: Lantus 40 units Daily        Lantus 20 units QHS        Metformin 850 mg BID        Humalog 7 units TID  Current Orders: IV Insulin Drip    IV Insulin Drip Started at 5:30pm last night  Has averaged around 2 units/hr on the IV Insulin Drip since Midnight last PM  1st dose Betamethasone given 12/01 at 0701am 2nd dose Betamethasone given 12/02 at 0700am  May consider leaving pt on the IV Insulin for the next 12-24 hours today while Betamethasone on board and try transitioning to SQ Insulin later this evening or tomorrow AM (12/03)    --Will follow patient during hospitalization--  Wyn Quaker RN, MSN, [place] Diabetes Coordinator Inpatient Glycemic Control Team Team Pager: [phone] (8a-5p)

## 2022-03-20 ENCOUNTER — Inpatient Hospital Stay (HOSPITAL_COMMUNITY): Payer: Medicaid Other

## 2022-03-20 DIAGNOSIS — I10 Essential (primary) hypertension: Secondary | ICD-10-CM | POA: Diagnosis not present

## 2022-03-20 LAB — ECHOCARDIOGRAM COMPLETE
Area-P 1/2: 3.85 cm2
Height: 67 in
S' Lateral: 3.1 cm
Weight: 4518.4 oz

## 2022-03-20 LAB — HEPATITIS C ANTIBODY: HCV Ab: NONREACTIVE

## 2022-03-20 LAB — HIV ANTIBODY (ROUTINE TESTING W REFLEX): HIV Screen 4th Generation wRfx: NONREACTIVE

## 2022-03-20 MED ORDER — ACETAMINOPHEN-CAFFEINE 500-65 MG PO TABS
2.0000 | ORAL_TABLET | Freq: Four times a day (QID) | ORAL | Status: DC | PRN
  Administered 2022-03-20 – 2022-03-23 (×4): 2 via ORAL
  Filled 2022-03-20 (×6): qty 2

## 2022-03-20 MED ORDER — INSULIN ASPART 100 UNIT/ML IJ SOLN
0.0000 [IU] | Freq: Three times a day (TID) | INTRAMUSCULAR | Status: DC
  Administered 2022-03-20 (×2): 4 [IU] via SUBCUTANEOUS
  Administered 2022-03-21 (×2): 2 [IU] via SUBCUTANEOUS

## 2022-03-20 MED ORDER — INSULIN ASPART 100 UNIT/ML IJ SOLN
4.0000 [IU] | Freq: Three times a day (TID) | INTRAMUSCULAR | Status: DC
  Administered 2022-03-20 – 2022-03-21 (×2): 4 [IU] via SUBCUTANEOUS

## 2022-03-20 MED ORDER — ENOXAPARIN SODIUM 60 MG/0.6ML IJ SOSY
60.0000 mg | PREFILLED_SYRINGE | INTRAMUSCULAR | Status: DC
  Administered 2022-03-20 – 2022-03-29 (×10): 60 mg via SUBCUTANEOUS
  Filled 2022-03-20 (×10): qty 0.6

## 2022-03-20 MED ORDER — ONDANSETRON 4 MG PO TBDP
4.0000 mg | ORAL_TABLET | Freq: Four times a day (QID) | ORAL | Status: DC | PRN
  Administered 2022-03-21 (×2): 4 mg via ORAL
  Filled 2022-03-20 (×3): qty 1

## 2022-03-20 MED ORDER — INSULIN GLARGINE-YFGN 100 UNIT/ML ~~LOC~~ SOLN
40.0000 [IU] | Freq: Every day | SUBCUTANEOUS | Status: DC
  Administered 2022-03-20 – 2022-03-23 (×4): 40 [IU] via SUBCUTANEOUS
  Filled 2022-03-20 (×4): qty 0.4

## 2022-03-20 NOTE — Progress Notes (Signed)
Echocardiogram 2D Echocardiogram has been performed.  Oneal Deputy Olivene Cookston RDCS 03/20/2022, 10:47 AM

## 2022-03-20 NOTE — Inpatient Diabetes Management (Signed)
ADA Standards of Care 2023 Diabetes in Pregnancy Target Glucose Ranges:  Fasting: 70 - 95 mg/dL 1 hr postprandial:  110 - '140mg'$ /dL (from first bite of meal) 2 hr postprandial:  100 - 120 mg/dL (from first bit of meal)   Glucose readings since Midnight based on Endotool:  102/ 99/ 100/ 86/ 126/ 103/ 89    Hx: DM2   Home DM Meds: Lantus 40 units Daily                              Lantus 20 units QHS                              Metformin 850 mg BID                              Humalog 7 units TID   Current Orders: IV Insulin Drip     1st dose Betamethasone given 12/01 at 0701am 2nd dose Betamethasone given 12/02 at 0700am   Has averaged around 4 units/hr on the IV Insulin Drip since Midnight last PM     May consider leaving on the IV Insulin Drip this AM until IV Insulin Drip rates are closer to 2-3 units/hr consistently  At time of transition, may consider:  1. Start Semglee 40 units Daily (AM home dose) Make sure to continue the IV Insulin Drip for 2 hours after Semglee on board and then can D/C the IV Insulin Drip  2. Start Novolog Correction (Moderate scale 0-16 units per the Pregnant Patient Order Set) TID After meals (2 hour Postprandial)  3. Start 50% home dose Meal Coverage: Novolog 4 units TID with meals (If pt allowed solid PO diet)    --Will follow patient during hospitalization--  Wyn Quaker RN, MSN, [place] Diabetes Coordinator Inpatient Glycemic Control Team Team Pager: [phone] (8a-5p)

## 2022-03-20 NOTE — Progress Notes (Addendum)
FACULTY PRACTICE ANTEPARTUM PROGRESS NOTE  Jane Schultz is a 41 y.o. G2P0101 at 39w2dwho is admitted for preeclampsia in milieu of chronic hypertension, type 2 DM and morbid obesity.  Estimated Date of Delivery: 06/17/22 Fetal presentation is breech.  Length of Stay:  2 Days. Admitted 03/18/2022  Subjective: Pt seen doing well.  She does c/o mild headache, but last tylenol was at 0300.  She denies visual changes and RUQ pain. Patient reports normal fetal movement.  She denies uterine contractions, denies bleeding and leaking of fluid per vagina.  Vitals:  Blood pressure (!) 152/72, pulse 77, temperature 98.4 F (36.9 C), temperature source Oral, resp. rate 18, height '5\' 7"'$  (1.702 m), weight 128.1 kg, last menstrual period 09/02/2021, SpO2 98 %. Physical Examination: CONSTITUTIONAL: Well-developed, well-nourished female in no acute distress.  HENT:  Normocephalic, atraumatic, External right and left ear normal. Oropharynx is clear and moist EYES: Conjunctivae and EOM are normal.  NECK: Normal range of motion, supple, no masses. SKIN: Skin is warm and dry. No rash noted. Not diaphoretic. No erythema. No pallor. [place] Alert and oriented to person, place, and time. Normal reflexes, muscle tone coordination. No cranial nerve deficit noted. PSYCHIATRIC: Normal mood and affect. Normal behavior. Normal judgment and thought content. CARDIOVASCULAR: Normal heart rate noted, regular rhythm RESPIRATORY: Effort and breath sounds normal, no problems with respiration noted MUSCULOSKELETAL: Normal range of motion. No edema and no tenderness. ABDOMEN: Soft, nontender, nondistended, gravid. CERVIX: deferred  Fetal monitoring: FHR: 140s bpm, Variability: moderate, Accelerations: Present, Decelerations: Absent , category 1 strip Uterine activity: none  Results for orders placed or performed during the hospital encounter of 03/18/22 (from the past 48 hour(s))  Comprehensive metabolic panel      Status: Abnormal   Collection Time: 03/18/22  2:23 PM  Result Value Ref Range   Sodium 132 (L) 135 - 145 mmol/L   Potassium 4.4 3.5 - 5.1 mmol/L    Comment: HEMOLYSIS AT THIS LEVEL MAY AFFECT RESULT   Chloride 106 98 - 111 mmol/L   CO2 17 (L) 22 - 32 mmol/L   Glucose, Bld 219 (H) 70 - 99 mg/dL    Comment: Glucose reference range applies only to samples taken after fasting for at least 8 hours.   BUN 16 6 - 20 mg/dL   Creatinine, Ser 0.94 0.44 - 1.00 mg/dL   Calcium 8.2 (L) 8.9 - 10.3 mg/dL   Total Protein 6.0 (L) 6.5 - 8.1 g/dL   Albumin 1.9 (L) 3.5 - 5.0 g/dL   AST 25 15 - 41 U/L    Comment: HEMOLYSIS AT THIS LEVEL MAY AFFECT RESULT   ALT 17 0 - 44 U/L    Comment: HEMOLYSIS AT THIS LEVEL MAY AFFECT RESULT   Alkaline Phosphatase 97 38 - 126 U/L   Total Bilirubin 0.4 0.3 - 1.2 mg/dL    Comment: HEMOLYSIS AT THIS LEVEL MAY AFFECT RESULT   GFR, Estimated >60 >60 mL/min    Comment: (NOTE) Calculated using the CKD-EPI Creatinine Equation (2021)    Anion gap 9 5 - 15    Comment: Performed at [facility] [address], [id number] CBC     Status: Abnormal   Collection Time: 03/18/22  2:23 PM  Result Value Ref Range   WBC 12.6 (H) 4.0 - 10.5 K/uL   RBC 3.96 3.87 - 5.11 MIL/uL   Hemoglobin 11.0 (L) 12.0 - 15.0 g/dL   HCT 33.2 (L) 36.0 - 46.0 %  MCV 83.8 80.0 - 100.0 fL   MCH 27.8 26.0 - 34.0 pg   MCHC 33.1 30.0 - 36.0 g/dL   RDW 13.4 11.5 - 15.5 %   Platelets 279 150 - 400 K/uL   nRBC 0.0 0.0 - 0.2 %    Comment: Performed at [facility], [place] [address] [place], [place] [address]  Type and screen [place]     Status: None   Collection Time: 03/18/22  2:23 PM  Result Value Ref Range   ABO/RH(D) O POS    Antibody Screen NEG    Sample Expiration      03/21/2022,2359 Performed at [facility], [place] [address] [place], [place] [address]   Glucose, capillary     Status: Abnormal   Collection Time: 03/18/22  6:44 PM   Result Value Ref Range   Glucose-Capillary 175 (H) 70 - 99 mg/dL    Comment: Glucose reference range applies only to samples taken after fasting for at least 8 hours.  Glucose, capillary     Status: Abnormal   Collection Time: 03/18/22  7:51 PM  Result Value Ref Range   Glucose-Capillary 151 (H) 70 - 99 mg/dL    Comment: Glucose reference range applies only to samples taken after fasting for at least 8 hours.  Glucose, capillary     Status: Abnormal   Collection Time: 03/18/22  9:03 PM  Result Value Ref Range   Glucose-Capillary 142 (H) 70 - 99 mg/dL    Comment: Glucose reference range applies only to samples taken after fasting for at least 8 hours.  Glucose, capillary     Status: Abnormal   Collection Time: 03/18/22 10:08 PM  Result Value Ref Range   Glucose-Capillary 121 (H) 70 - 99 mg/dL    Comment: Glucose reference range applies only to samples taken after fasting for at least 8 hours.    I have reviewed the patient's current medications.  ASSESSMENT: Principal Problem:   Elevated blood pressure complicating pregnancy, antepartum, second trimester Active Problems:   Type 2 diabetes mellitus without complication [facility])   Deaf   Supervision of high risk pregnancy in second trimester   Chronic hypertension affecting pregnancy   Multigravida of advanced maternal age in second trimester   History of cesarean delivery   PLAN: Preeclampsia/CHTN - BP better controlled today.  Metoprolol was discontinued.  Will continue procardia and labetalol.  Low threshold to increase dosage. -NST BID - pt does have mild headache, will try tylenol with caffeine  2. Type 2 DM -discontinue endotool, will follow diabetic coordinator recommendations for long term and short term insulin  3. Hx of cesarean section -fetus is currently breech and would need repeat c section.   -if fetus becomes vertex we did discuss VBAC vs repeat c section with all risks.  Pt will still need to sign VBAC  consent.  41 y.o. G2P0101 at 24w2dwith Estimated Date of Delivery: 06/17/22 was seen today in office to discuss trial of labor after cesarean section (TOLAC) versus elective repeat cesarean delivery (ERCD). The following risks were discussed with the patient.  Risk of uterine rupture at term is 0.78 percent with TOLAC and 0.22 percent with ERCD. 1 in 10 uterine ruptures will result in neonatal death or neurological injury. The benefits of a trial of labor after cesarean (TOLAC) resulting in a vaginal birth after cesarean (VBAC) include the following: shorter length of hospital stay and postpartum recovery (in most cases); fewer complications,  such as postpartum fever, wound or uterine infection, thromboembolism (blood clots in the leg or lung), need for blood transfusion and fewer neonatal breathing problems. The risks of an attempted VBAC or TOLAC include the following: Risk of failed trial of labor after cesarean (TOLAC) without a vaginal birth after cesarean (VBAC) resulting in repeat cesarean delivery (RCD) in about 20 to 62 percent of women who attempt VBAC.  Risk of rupture of uterus resulting in an emergency cesarean delivery. The risk of uterine rupture may be related in part to the type of uterine incision made during the first cesarean delivery. A previous transverse uterine incision has the lowest risk of rupture (0.2 to 1.5 percent risk). Vertical or T-shaped uterine incisions have a higher risk of uterine rupture (4 to 9 percent risk)The risk of fetal death is very low with both VBAC and elective repeat cesarean delivery (ERCD), but the likelihood of fetal death is higher with VBAC than with ERCD. Maternal death is very rare with either type of delivery. The risks of an elective repeat cesarean delivery (ERCD) were reviewed with the patient including but not limited to: 05/998 risk of uterine rupture which could have serious consequences, bleeding which may require transfusion; infection which  may require antibiotics; injury to bowel, bladder or other surrounding organs (bowel, bladder, ureters); injury to the fetus; need for additional procedures including hysterectomy in the event of a life-threatening hemorrhage; thromboembolic phenomenon; abnormal placentation; incisional problems; death and other postoperative or anesthesia complications.    4. Proteinuria -appreciate nephrology consult and recommendations  Will continue labetalol and hold lasix unless absolutely necessary. Lovenox has been added    Continue routine antenatal care.   Lynnda Shields, MD [place] Attending, Center for [facility] 03/20/2022 10:32 AM

## 2022-03-20 NOTE — Progress Notes (Signed)
BP's are stable.  Lovenox has been started for VTE prophylaxis in patient w/ nephrotic syndrome. No other recommendations. I will f/u serologies. If in the post-partum setting her proteinuria persists she can be referred to [place] for further evalutation. Will sign off.  Kelly Splinter, MD [employer] 03/20/2022, 3:28 PM

## 2022-03-20 NOTE — Progress Notes (Addendum)
CSW was consulted due to medication assistance. CSW attempted to meet with patient at bedside to complete consult. CSW was accompanied by on-site ASL interpreter, Margaretha Sheffield. When CSW entered room, patient was observed asleep. Patient awoke when CSW turned light on and appeared agitated. CSW apologized, introduced self, and explained reason for consult. Patient shared she is so tired and requested CSW return tomorrow. CSW to follow up at a later time.   Signed,  Berniece Salines, MSW, LCSWA, [place] 03/20/2022 3:38 PM

## 2022-03-21 DIAGNOSIS — O24414 Gestational diabetes mellitus in pregnancy, insulin controlled: Secondary | ICD-10-CM

## 2022-03-21 DIAGNOSIS — O321XX0 Maternal care for breech presentation, not applicable or unspecified: Secondary | ICD-10-CM

## 2022-03-21 DIAGNOSIS — O133 Gestational [pregnancy-induced] hypertension without significant proteinuria, third trimester: Secondary | ICD-10-CM

## 2022-03-21 LAB — ANTI-DNA ANTIBODY, DOUBLE-STRANDED: ds DNA Ab: <1 IU/mL (ref 0–9)

## 2022-03-21 LAB — GLUCOSE, CAPILLARY
Glucose-Capillary: 60 mg/dL — ABNORMAL LOW (ref 70–99)
Glucose-Capillary: 91 mg/dL (ref 70–99)
Glucose-Capillary: 104 mg/dL — ABNORMAL HIGH (ref 70–99)
Glucose-Capillary: 70 mg/dL (ref 70–99)

## 2022-03-21 LAB — C4 COMPLEMENT: Complement C4, Body Fluid: 32 mg/dL (ref 12–38)

## 2022-03-21 LAB — C3 COMPLEMENT: C3 Complement: 155 mg/dL (ref 82–167)

## 2022-03-21 MED ORDER — LABETALOL HCL 200 MG PO TABS
400.0000 mg | ORAL_TABLET | Freq: Three times a day (TID) | ORAL | Status: DC
  Administered 2022-03-21 (×2): 400 mg via ORAL
  Filled 2022-03-21 (×2): qty 2

## 2022-03-21 MED ORDER — ORAL CARE MOUTH RINSE: 15.0000 mL | OROMUCOSAL | Status: DC | PRN

## 2022-03-21 MED ORDER — INSULIN ASPART 100 UNIT/ML IJ SOLN
7.0000 [IU] | Freq: Three times a day (TID) | INTRAMUSCULAR | Status: DC
  Administered 2022-03-21 – 2022-03-22 (×3): 7 [IU] via SUBCUTANEOUS

## 2022-03-21 MED ORDER — LABETALOL HCL 200 MG PO TABS
600.0000 mg | ORAL_TABLET | Freq: Three times a day (TID) | ORAL | Status: DC
  Administered 2022-03-21 – 2022-03-23 (×5): 600 mg via ORAL
  Filled 2022-03-21 (×5): qty 3
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
## 2022-03-21 NOTE — Inpatient Diabetes Management (Signed)
Inpatient Diabetes Program Recommendations  Diabetes Treatment Program Recommendations  ADA Standards of Care Diabetes in Pregnancy Target Glucose Ranges:  Fasting: 70 - 95 mg/dL 1 hr postprandial: Less than '140mg'$ /dL (from first bite of meal) 2 hr postprandial: Less than 120 mg/dL (from first bite of meal)    Lab Results  Component Value Date   GLUCAP 121 (H) 03/18/2022   HGBA1C 6.3 (H) 02/10/2022    Review of Glycemic Control  Latest Reference Range & Units 03/18/22 19:51 03/18/22 21:03 03/18/22 22:08  Glucose-Capillary 70 - 99 mg/dL 151 (H) 142 (H) 121 (H)  (H): Data is abnormally high Hx: DM2   Home DM Meds: Lantus 40 units Daily                              Lantus 20 units QHS                              Metformin 850 mg BID                              Humalog 7 units TID   Current Orders: Semglee 40 units QD, Metformin 850 mg BID, Novolog 4 units TID, Novolog 0-16 units TID  Inpatient Diabetes Program Recommendations:    Consider increasing Novolog 7 units TID (assuming patient is consuming >50% of meals).  Thanks, Bronson Curb, MSN, RNC-OB Diabetes Coordinator [phone] (8a-5p)

## 2022-03-21 NOTE — Care Management Note (Signed)
Case Management Note  Patient Details  Name: Jane Schultz MRN: [id number] Date of Birth: 06/06/1980  Subjective/Objective:                    Action/Plan:   Expected Discharge Date:                  Expected Discharge Plan:     41 y.o. G2P0101 at 47w3dby early ultrasound who is admitted for worsening HTN, swelling.   Fetal presentation is breech. In-House Referral:     Additional Comments: CM had consult for medication needs. CM met with patient in room and on site interpreter.  CM discussed medication concerns. Patient stated her meds cost up to 20 $ sometimes. After talking a while she shared with CM that her co pay is no more than 3$ each that is the total when she has several meds at a time she is picking up.  CM ask her if she had concerns or needs she would like to see a CSW ?  She shared that she did not care to see a CSW.  She shared that she has a PCP but could not remember who it was.  Patient shared with CM that she uses medicaid transport for medical appointments at times and knows how to contact them if needed.Patient does have active medicaid. [facility] Patient denied any needs or wanting help at this time.CM shared information with RN and CSW.   LRosita FireRNC-MNN, BSN Transitions of Care Pediatrics/Women's and [place]  03/21/2022, 3:46 PM

## 2022-03-21 NOTE — Progress Notes (Signed)
Late morning/early afternoon patient with severe range BPs requiring 4 doses of IV antihypertensives.  Patient also had hypoglycemic episode.  10:37am - BP 175/74 10:54am - BP 192-83 10:55am - Labetalol '20mg'$  IVP 11:05am - discontinued IV site d/t patient c/o painful site 11:18am - BP 177/81 11:35am - new IV site started 11:42am - BP 165-79 11:48am - Labetalol '40mg'$  IVP 12:15pm - BP 165/74 12:20pm - Labetalol '80mg'$  IVP 12:42pm  - BP 163/78 12:52pm - patient back from bathroom c/o nausea, CBG with pt's Dexcom 81, BP 171/84 12:55pm - hydralazine '10mg'$  IVP 13:17pm - CBG 60 with hospital glucometer 13:21pm - BP 151/73, patient starting to eat lunch, held 7 units novolog meal coverage 14:02pm - CBG 91 with hospital glucometer, pt napping  Phoned Dr. Roselie Awkward with above information.  No new orders at this time.    Kennith Center, RN 03/21/22  14:43
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
## 2022-03-21 NOTE — Progress Notes (Signed)
Patient reports that she has not felt the baby move all day.  Placed EFM and will review tracing.  Patient also c/o heartburn.  Gave calcium carbonate 2 tablets.

## 2022-03-22 LAB — ANTINUCLEAR ANTIBODIES, IFA: ANA Ab, IFA: NEGATIVE

## 2022-03-22 LAB — GLUCOSE, CAPILLARY
Glucose-Capillary: 84 mg/dL (ref 70–99)
Glucose-Capillary: 60 mg/dL — ABNORMAL LOW (ref 70–99)
Glucose-Capillary: 67 mg/dL — ABNORMAL LOW (ref 70–99)
Glucose-Capillary: 84 mg/dL (ref 70–99)
Glucose-Capillary: 84 mg/dL (ref 70–99)

## 2022-03-22 MED ORDER — INSULIN ASPART 100 UNIT/ML IJ SOLN
4.0000 [IU] | Freq: Three times a day (TID) | INTRAMUSCULAR | Status: DC
  Administered 2022-03-22 – 2022-03-23 (×2): 4 [IU] via SUBCUTANEOUS

## 2022-03-22 MED ORDER — PANTOPRAZOLE SODIUM 40 MG PO TBEC
40.0000 mg | DELAYED_RELEASE_TABLET | Freq: Every day | ORAL | Status: DC
  Administered 2022-03-22 – 2022-03-30 (×9): 40 mg via ORAL
  Filled 2022-03-22 (×9): qty 1

## 2022-03-22 NOTE — Progress Notes (Signed)
Pt has been encouraged to  eat her snacks today and has not been cooperative or compliant   has used interpreter with pt
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
## 2022-03-22 NOTE — Inpatient Diabetes Management (Signed)
Inpatient Diabetes Program Recommendations  AACE/ADA: New Consensus Statement on Inpatient Glycemic Control (2015)  Target Ranges:  Prepandial:   less than 140 mg/dL      Peak postprandial:   less than 180 mg/dL (1-2 hours)      Critically ill patients:  140 - 180 mg/dL   Lab Results  Component Value Date   GLUCAP 60 (L) 03/22/2022   HGBA1C 6.3 (H) 02/10/2022    Review of Glycemic Control  Hx: DM2   Home DM Meds: Lantus 40 units Daily                              Lantus 20 units QHS                              Metformin 850 mg BID                              Humalog 7 units TID   Current Orders: Semglee 40 units QD, Metformin 850 mg BID, Novolog 7 units TID, Novolog 0-16 units TID   Inpatient Diabetes Program Recommendations:     Noted hypoglycemia following meal coverage. Consider reducing to Novolog 4 units TID (Assuming patient is consuming >50% of meals).   Thanks, Bronson Curb, MSN, RNC-OB Diabetes Coordinator [phone] (8a-5p)

## 2022-03-23 DIAGNOSIS — O10912 Unspecified pre-existing hypertension complicating pregnancy, second trimester: Secondary | ICD-10-CM

## 2022-03-23 DIAGNOSIS — O09522 Supervision of elderly multigravida, second trimester: Secondary | ICD-10-CM | POA: Diagnosis not present

## 2022-03-23 DIAGNOSIS — O24319 Unspecified pre-existing diabetes mellitus in pregnancy, unspecified trimester: Secondary | ICD-10-CM

## 2022-03-23 DIAGNOSIS — O1492 Unspecified pre-eclampsia, second trimester: Secondary | ICD-10-CM | POA: Diagnosis not present

## 2022-03-23 DIAGNOSIS — Z3A27 27 weeks gestation of pregnancy: Secondary | ICD-10-CM

## 2022-03-23 DIAGNOSIS — O99210 Obesity complicating pregnancy, unspecified trimester: Secondary | ICD-10-CM

## 2022-03-23 LAB — GLUCOSE, CAPILLARY
Glucose-Capillary: 62 mg/dL — ABNORMAL LOW (ref 70–99)
Glucose-Capillary: 99 mg/dL (ref 70–99)
Glucose-Capillary: 61 mg/dL — ABNORMAL LOW (ref 70–99)
Glucose-Capillary: 78 mg/dL (ref 70–99)
Glucose-Capillary: 75 mg/dL (ref 70–99)
Glucose-Capillary: 69 mg/dL — ABNORMAL LOW (ref 70–99)

## 2022-03-23 MED ORDER — SODIUM CHLORIDE 0.9% FLUSH
3.0000 mL | Freq: Two times a day (BID) | INTRAVENOUS | Status: DC
  Administered 2022-03-23 – 2022-03-30 (×15): 3 mL via INTRAVENOUS

## 2022-03-23 MED ORDER — LABETALOL HCL 200 MG PO TABS
800.0000 mg | ORAL_TABLET | Freq: Three times a day (TID) | ORAL | Status: DC
  Administered 2022-03-23 – 2022-03-30 (×22): 800 mg via ORAL
  Filled 2022-03-23 (×22): qty 4

## 2022-03-23 MED ORDER — INSULIN GLARGINE-YFGN 100 UNIT/ML ~~LOC~~ SOLN
35.0000 [IU] | Freq: Every day | SUBCUTANEOUS | Status: DC
  Administered 2022-03-24 – 2022-03-30 (×7): 35 [IU] via SUBCUTANEOUS
  Filled 2022-03-23 (×7): qty 0.35

## 2022-03-23 MED ORDER — INSULIN ASPART 100 UNIT/ML IJ SOLN
3.0000 [IU] | Freq: Three times a day (TID) | INTRAMUSCULAR | Status: DC
  Administered 2022-03-23 – 2022-03-30 (×17): 3 [IU] via SUBCUTANEOUS

## 2022-03-23 MED ORDER — INSULIN ASPART 100 UNIT/ML IJ SOLN
0.0000 [IU] | Freq: Three times a day (TID) | INTRAMUSCULAR | Status: DC
  Administered 2022-03-24 – 2022-03-25 (×2): 2 [IU] via SUBCUTANEOUS
  Administered 2022-03-26 (×3): 3 [IU] via SUBCUTANEOUS
  Administered 2022-03-27: 2 [IU] via SUBCUTANEOUS
  Administered 2022-03-27: 4 [IU] via SUBCUTANEOUS
  Administered 2022-03-27 – 2022-03-28 (×4): 3 [IU] via SUBCUTANEOUS
  Administered 2022-03-29: 2 [IU] via SUBCUTANEOUS
  Administered 2022-03-29 (×2): 3 [IU] via SUBCUTANEOUS
  Administered 2022-03-30: 4 [IU] via SUBCUTANEOUS

## 2022-03-23 NOTE — Consult Note (Signed)
MFM Note  Jane Schultz is a G2, P1 currently at 27 weeks and 5 days.  She was seen in consultation at the request of Dr. Roselie Awkward due to superimposed preeclampsia.    The patient's pregnancy has been complicated by chronic hypertension, advanced maternal age, obesity, and pregestational diabetes.  She is currently treated with insulin and metformin for her diabetes and labetalol 600 mg twice a day and nifedipine 90 mg daily for blood pressure control.  She has received a complete course of antenatal corticosteroids.  Her blood pressures are currently in the 140s to 150s over 60s to 70s range.  Her [place] labs on admission were all within normal limits.    Her P/C ratio performed 6 days ago was 9.39. It was 6.07 one month ago.  The significant rise in her urine protein along with her elevated blood pressures confirms the diagnosis of preeclampsia.  She had a growth ultrasound performed on December 2 showing an overall EFW of 2 pounds 3 ounces (25th percentile).  There was normal amniotic fluid with a total AFI of 10 cm.  The fetus was in the breech presentation.  She currently denies any signs or symptoms of severe preeclampsia.  The implications and management of preeclampsia was discussed with the patient.    She was advised that preeclampsia can affect both the mother and the fetus.     In the mother, preeclampsia may cause a rise in blood pressures and it can affect the mother's kidney, liver, and platelet functions.  It may also cause the mother to have seizures.     In the fetus, it may cause growth restriction and oligohydramnios.     She understands that delivery is the only treatment for preeclampsia.   Due to severe preeclampsia, I would recommend inpatient management until delivery.     She should continue labetalol and nifedipine in an attempt to control her blood pressures so that she can reach a more optimal gestational age for delivery.     While hospitalized, she  should continue daily fetal testing.    She should have an amniotic fluid check performed each week. She should have a repeat growth ultrasound one month following her last growth scan.   Her [place] labs should be obtained twice a week.   The goal for her delivery would be at around 34 weeks.   Delivery prior to 34 weeks is recommended should she complain of any signs or symptoms of severe preeclampsia, should her blood pressures remain persistently greater than 160/100 despite treatment with medication, should she require IV push antihypertensive medications for blood pressure control, should she show any abnormalities in her preeclampsia labs, or should there be nonreassuring fetal status.   The patient understands that her baby will require a NICU admission after delivery.   Should she require delivery at less than 32 weeks, she should receive magnesium sulfate for fetal neuroprotection.    She should also receive magnesium sulfate for maternal seizure prophylaxis for 24 hours after delivery.    A rescue course of antenatal corticosteroids should be given should she require delivery prior to 34 weeks and it has been one week or more since she received the initial course.    At the end of the consultation, the patient and stated that all of their questions have been answered.  All conversations were held with the patient today with the help of a sign language interpreter.   Thank you for referring this patient for a Maternal-Fetal  Medicine consultation.     Recommendations:   Inpatient management until delivery Continue labetalol and nifedipine for blood pressure control Continue insulin and metformin for blood glucose control Daily fetal testing  Twice weekly preeclampsia labs while hospitalized Weekly amniotic fluid checks Growth scan next month Rescue course of corticosteroids as indicated Delivery at 34 weeks Delivery prior to 34 weeks would be indicated:             Should her  blood pressures be persistently greater than 160/100             Should she require IV push medications for blood pressure control             Should she complain of any signs and symptoms of severe preeclampsia             Should her preeclampsia labs show any abnormalities             At any time for nonreassuring fetal status Magnesium sulfate should be given for maternal seizure prophylaxis for 24 hours postpartum
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
## 2022-03-23 NOTE — Inpatient Diabetes Management (Signed)
Inpatient Diabetes Program Recommendations  AACE/ADA: New Consensus Statement on Inpatient Glycemic Control (2015)  Target Ranges:  Prepandial:   less than 140 mg/dL      Peak postprandial:   less than 180 mg/dL (1-2 hours)      Critically ill patients:  140 - 180 mg/dL   Lab Results  Component Value Date   GLUCAP 61 (L) 03/23/2022   HGBA1C 6.3 (H) 02/10/2022    Review of Glycemic Control  Latest Reference Range & Units 03/23/22 08:51 03/23/22 10:50 03/23/22 15:28  Glucose-Capillary 70 - 99 mg/dL 62 (L) 99 61 (L)  (L): Data is abnormally low  Current orders for Inpatient glycemic control:  Semglee 40 units QD Novolog 0-16 units TID after meals Novolog 4 units TID  Inpatient Diabetes Program Recommendations:    Received page from [place], [place] with concerns of lower glucose trends post BMZ.   Please consider:  Semglee 35 units QAM Novolog 0-14 units TID after meals  Novolog 3 units TID with meals  Will continue to follow while inpatient.  Thank you, Reche Dixon, MSN, [place] Diabetes Coordinator Inpatient Diabetes Program [phone] (team pager from 8a-5p)

## 2022-03-23 NOTE — Progress Notes (Signed)
Hypoglycemic Event  CBG: 61  Treatment: 4 oz juice/soda  Symptoms: Sweaty  Follow-up CBG: Time CBG Result:  Possible Reasons for Event: Medication regimen:    Comments/MD notified: Dr Tamera Stands, Pat Kocher

## 2022-03-23 NOTE — Progress Notes (Signed)
Patient bedtime CBG = 69. Patient strongly encourage to drink juice.Patient declined. RN and NT brought patient her snack bag and reinforced need to eat something to increase her glucose. RN to continue plan of care. Danie Binder, RN

## 2022-03-24 ENCOUNTER — Ambulatory Visit: Payer: Medicaid Other

## 2022-03-24 LAB — GLUCOSE, CAPILLARY
Glucose-Capillary: 71 mg/dL (ref 70–99)
Glucose-Capillary: 96 mg/dL (ref 70–99)
Glucose-Capillary: 78 mg/dL (ref 70–99)
Glucose-Capillary: 77 mg/dL (ref 70–99)
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
## 2022-03-25 ENCOUNTER — Inpatient Hospital Stay (HOSPITAL_BASED_OUTPATIENT_CLINIC_OR_DEPARTMENT_OTHER): Payer: Medicaid Other

## 2022-03-25 DIAGNOSIS — Z3A28 28 weeks gestation of pregnancy: Secondary | ICD-10-CM

## 2022-03-25 DIAGNOSIS — E669 Obesity, unspecified: Secondary | ICD-10-CM

## 2022-03-25 DIAGNOSIS — O99333 Smoking (tobacco) complicating pregnancy, third trimester: Secondary | ICD-10-CM

## 2022-03-25 DIAGNOSIS — Z794 Long term (current) use of insulin: Secondary | ICD-10-CM

## 2022-03-25 DIAGNOSIS — Z7984 Long term (current) use of oral hypoglycemic drugs: Secondary | ICD-10-CM

## 2022-03-25 DIAGNOSIS — O10013 Pre-existing essential hypertension complicating pregnancy, third trimester: Secondary | ICD-10-CM

## 2022-03-25 DIAGNOSIS — O99213 Obesity complicating pregnancy, third trimester: Secondary | ICD-10-CM

## 2022-03-25 DIAGNOSIS — O24113 Pre-existing diabetes mellitus, type 2, in pregnancy, third trimester: Secondary | ICD-10-CM | POA: Diagnosis not present

## 2022-03-25 DIAGNOSIS — O09523 Supervision of elderly multigravida, third trimester: Secondary | ICD-10-CM

## 2022-03-25 DIAGNOSIS — O09293 Supervision of pregnancy with other poor reproductive or obstetric history, third trimester: Secondary | ICD-10-CM

## 2022-03-25 DIAGNOSIS — O09213 Supervision of pregnancy with history of pre-term labor, third trimester: Secondary | ICD-10-CM

## 2022-03-25 DIAGNOSIS — O289 Unspecified abnormal findings on antenatal screening of mother: Secondary | ICD-10-CM

## 2022-03-25 DIAGNOSIS — E119 Type 2 diabetes mellitus without complications: Secondary | ICD-10-CM | POA: Diagnosis not present

## 2022-03-25 DIAGNOSIS — Z87891 Personal history of nicotine dependence: Secondary | ICD-10-CM

## 2022-03-25 LAB — GLUCOSE, CAPILLARY
Glucose-Capillary: 74 mg/dL (ref 70–99)
Glucose-Capillary: 85 mg/dL (ref 70–99)
Glucose-Capillary: 81 mg/dL (ref 70–99)
Glucose-Capillary: 105 mg/dL — ABNORMAL HIGH (ref 70–99)

## 2022-03-25 NOTE — Progress Notes (Signed)
Initial Nutrition Assessment  DOCUMENTATION CODES:   Obesity unspecified  INTERVENTION:  Carbohydrate modified gestational diabetic diet May order double protein portions  NUTRITION DIAGNOSIS:   Increased nutrient needs related to  (pregnancy and fetal growth requirements) as evidenced by  (28 weeks IUP).  GOAL:   Patient will meet greater than or equal to 90% of their needs   MONITOR:   Weight trends  REASON FOR ASSESSMENT:   Antenatal, LOS    ASSESSMENT:   Now 28 0/7 weeks, adm until delivery due to [facility] and GDM. Hx of DM II.  Pre-preg weight 102 Kg, BMI 35. 16 kg weight gain, but 2+ edema on RN assessment.  GDM controlled with insulin and metformin. On prenatal Vits, 12/1 Hct 33.2%   Diet Order:   Diet Order             Diet gestational carb mod Fluid consistency: Thin; Room service appropriate? Yes  Diet effective now                   EDUCATION NEEDS:   No education needs have been identified at this time  Skin:  Skin Assessment: Reviewed RN Assessment   Height:   Ht Readings from Last 1 Encounters:  03/20/22 '5\' 7"'$  (1.702 m)    Weight:   Wt Readings from Last 1 Encounters:  03/24/22 118.4 kg    Ideal Body Weight:   135 lbs  BMI:  Body mass index is 40.88 kg/m.  Estimated Nutritional Needs:   Kcal:  2500-2700  Protein:  110-120 g  Fluid:  > 2.5 L
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
## 2022-03-26 DIAGNOSIS — Z3A28 28 weeks gestation of pregnancy: Secondary | ICD-10-CM

## 2022-03-26 LAB — GLUCOSE, CAPILLARY
Glucose-Capillary: 125 mg/dL — ABNORMAL HIGH (ref 70–99)
Glucose-Capillary: 147 mg/dL — ABNORMAL HIGH (ref 70–99)
Glucose-Capillary: 132 mg/dL — ABNORMAL HIGH (ref 70–99)
Glucose-Capillary: 130 mg/dL — ABNORMAL HIGH (ref 70–99)
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
## 2022-03-27 LAB — GLUCOSE, CAPILLARY
Glucose-Capillary: 90 mg/dL (ref 70–99)
Glucose-Capillary: 172 mg/dL — ABNORMAL HIGH (ref 70–99)
Glucose-Capillary: 135 mg/dL — ABNORMAL HIGH (ref 70–99)
Glucose-Capillary: 95 mg/dL (ref 70–99)
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
## 2022-03-28 LAB — COMPREHENSIVE METABOLIC PANEL
ALT: 15 U/L (ref 0–44)
AST: 19 U/L (ref 15–41)
Albumin: 1.8 g/dL — ABNORMAL LOW (ref 3.5–5.0)
Alkaline Phosphatase: 92 U/L (ref 38–126)
Anion gap: 8 (ref 5–15)
BUN: 21 mg/dL — ABNORMAL HIGH (ref 6–20)
CO2: 21 mmol/L — ABNORMAL LOW (ref 22–32)
Calcium: 8 mg/dL — ABNORMAL LOW (ref 8.9–10.3)
Chloride: 104 mmol/L (ref 98–111)
Creatinine, Ser: 0.77 mg/dL (ref 0.44–1.00)
GFR, Estimated: >60 mL/min (ref >60)
Glucose, Bld: 111 mg/dL — ABNORMAL HIGH (ref 70–99)
Potassium: 3.8 mmol/L (ref 3.5–5.1)
Sodium: 133 mmol/L — ABNORMAL LOW (ref 135–145)
Total Bilirubin: 0.2 mg/dL — ABNORMAL LOW (ref 0.3–1.2)
Total Protein: 5.2 g/dL — ABNORMAL LOW (ref 6.5–8.1)

## 2022-03-28 LAB — CBC
HCT: 28.8 % — ABNORMAL LOW (ref 36.0–46.0)
Hemoglobin: 9.6 g/dL — ABNORMAL LOW (ref 12.0–15.0)
MCH: 28.9 pg (ref 26.0–34.0)
MCHC: 33.3 g/dL (ref 30.0–36.0)
MCV: 86.7 fL (ref 80.0–100.0)
Platelets: 265 10*3/uL (ref 150–400)
RBC: 3.32 MIL/uL — ABNORMAL LOW (ref 3.87–5.11)
RDW: 13.7 % (ref 11.5–15.5)
WBC: 12.7 10*3/uL — ABNORMAL HIGH (ref 4.0–10.5)
nRBC: 0 % (ref 0.0–0.2)

## 2022-03-28 LAB — GLUCOSE, CAPILLARY
Glucose-Capillary: 102 mg/dL — ABNORMAL HIGH (ref 70–99)
Glucose-Capillary: 147 mg/dL — ABNORMAL HIGH (ref 70–99)
Glucose-Capillary: 126 mg/dL — ABNORMAL HIGH (ref 70–99)
Glucose-Capillary: 138 mg/dL — ABNORMAL HIGH (ref 70–99)
Glucose-Capillary: 144 mg/dL — ABNORMAL HIGH (ref 70–99)

## 2022-03-28 MED ORDER — FERROUS SULFATE 325 (65 FE) MG PO TABS
325.0000 mg | ORAL_TABLET | ORAL | Status: DC
  Administered 2022-03-29: 325 mg via ORAL
  Filled 2022-03-28: qty 1
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
## 2022-03-29 LAB — GLUCOSE, CAPILLARY
Glucose-Capillary: 79 mg/dL (ref 70–99)
Glucose-Capillary: 108 mg/dL — ABNORMAL HIGH (ref 70–99)
Glucose-Capillary: 103 mg/dL — ABNORMAL HIGH (ref 70–99)
Glucose-Capillary: 129 mg/dL — ABNORMAL HIGH (ref 70–99)
Glucose-Capillary: 144 mg/dL — ABNORMAL HIGH (ref 70–99)

## 2022-03-29 LAB — TYPE AND SCREEN
ABO/RH(D): O POS
Antibody Screen: NEGATIVE

## 2022-03-29 MED ORDER — COMPLETENATE 29-1 MG PO CHEW
1.0000 | CHEWABLE_TABLET | Freq: Every day | ORAL | Status: DC
  Administered 2022-03-29 – 2022-03-30 (×2): 1 via ORAL
  Filled 2022-03-29 (×2): qty 1

## 2022-03-29 MED ORDER — TETANUS-DIPHTH-ACELL PERTUSSIS 5-2.5-18.5 LF-MCG/0.5 IM SUSY
0.5000 mL | PREFILLED_SYRINGE | Freq: Once | INTRAMUSCULAR | Status: AC
  Administered 2022-03-29: 0.5 mL via INTRAMUSCULAR
  Filled 2022-03-29: qty 0.5

## 2022-03-29 MED ORDER — CYCLOBENZAPRINE HCL 10 MG PO TABS
5.0000 mg | ORAL_TABLET | Freq: Three times a day (TID) | ORAL | Status: DC | PRN
  Administered 2022-03-29 – 2022-03-30 (×2): 5 mg via ORAL
  Filled 2022-03-29 (×2): qty 1
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
## 2022-03-29 NOTE — Progress Notes (Signed)
CSW acknowledged consult for assistance with hearing assisted device to notify patient that baby is crying. CSW met with patient at bedside and utilized ASL interpreter (Kelly). CSW introduced self and explained reason for consult. Patient confirmed needing a device and shared that it was provided free of charge from the Department of [place]. Patient was unable to specify which agency specifically provided the device. CSW agreed to followed up with local health department and or department of social services for assistance of who can assist with obtaining this resource for patient. CSW asked if patient wanted to be updated when CSW learns more, patient reported yes. CSW inquired about any additional needs, patient asked about WIC services and getting a breast pump. Patient confirmed that she has WIC. CSW explained that lactation consultant will see patient after delivery and complete a WIC referral for a breast pump, patient reported no other needs. CSW agreed to return once CSW has an update.   CSW contacted Health Department social worker (Kim Herzing) and inquired about resources for patient. Health Department social worker provided contact information for [employer] [facility] [employer] ([phone]).   CSW contacted [employer] [facility] [employer], no answer. CSW left voicemail requesting a return phone call.   CSW awaiting return call.   Kimberly Long, LCSW Clinical Social Worker [facility] Cell#: [phone]  

## 2022-03-29 NOTE — Evaluation (Signed)
Physical Therapy Evaluation Patient Details Name: Jane Schultz MRN: [id number] DOB: 09/11/1980 Today's Date: 03/29/2022  History of Present Illness  Pt is 41 year old presented to [facility] on  03/21/22 with worsening HTN and swelling at 39w3dpregnant. Pt reported rt hip stiffness and pain to MD. PMH - Deaf, HTN, asthma, DM  Clinical Impression  Pt reports rt hip pain that is intermittent. Pt incr time in bed with pregnancy complications which has likely exacerbated the pain. Gave pt a few exercises to promote hip function and tried some stretching in sidelying which pt reported had no effect on her hip pain. Encouraged pt to mobilize more frequently. Will return for one more session to give review exercise progam. Expect once she is moving around more and has delivered she will have reduced pain.        Recommendations for follow up therapy are one component of a multi-disciplinary discharge planning process, led by the attending physician.  Recommendations may be updated based on patient status, additional functional criteria and insurance authorization.  Follow Up Recommendations No PT follow up      Assistance Recommended at Discharge PRN  Patient can return home with the following       Equipment Recommendations Rolling walker (2 wheels)  Recommendations for Other Services       Functional Status Assessment Patient has had a recent decline in their functional status and demonstrates the ability to make significant improvements in function in a reasonable and predictable amount of time.     Precautions / Restrictions Precautions Precautions: None      Mobility  Bed Mobility Overal bed mobility: Modified Independent             General bed mobility comments: Incr time due to rt hip soreness    Transfers Overall transfer level: Modified independent Equipment used: Rolling walker (2 wheels), None               General transfer comment: Incr time to  rise    Ambulation/Gait Ambulation/Gait assistance: Modified independent (Device/Increase time) Gait Distance (Feet): 30 Feet Assistive device: Rolling walker (2 wheels) Gait Pattern/deviations: Step-through pattern, Decreased stride length Gait velocity: decr Gait velocity interpretation: 1.31 - 2.62 ft/sec, indicative of limited community ambulator   General Gait Details: Slow, steady gait  Stairs            Wheelchair Mobility    Modified Rankin (Stroke Patients Only)       Balance Overall balance assessment: No apparent balance deficits (not formally assessed)                                           Pertinent Vitals/Pain Pain Assessment Pain Assessment: 0-10 Pain Score: 4  Pain Location: rt hip Pain Descriptors / Indicators: Sore Pain Intervention(s): Limited activity within patient's tolerance, Repositioned, Other (comment) (Hip extension stretch)    Home Living Family/patient expects to be discharged to:: Private residence Living Arrangements: Non-relatives/Friends;Spouse/significant other   Type of Home: House Home Access: Stairs to enter   ETechnical brewerof Steps: 3   Home Layout: One level        Prior Function Prior Level of Function : Independent/Modified Independent                     Hand Dominance        Extremity/Trunk Assessment  Upper Extremity Assessment Upper Extremity Assessment: Overall WFL for tasks assessed    Lower Extremity Assessment Lower Extremity Assessment: Overall WFL for tasks assessed       Communication   Communication: Deaf (Used video interpreter [phone])  Cognition Arousal/Alertness: Awake/alert Behavior During Therapy: WFL for tasks assessed/performed Overall Cognitive Status: Within Functional Limits for tasks assessed                                          General Comments      Exercises General Exercises - Lower Extremity Gluteal  Sets: Strengthening, Both, 5 reps, Supine Heel Slides: AROM, Right, 5 reps, Supine Antenatal Exercises Sidelying Hip Flexor Stretch: PROM, Right, 5 reps   Assessment/Plan    PT Assessment Patient needs continued PT services  PT Problem List Decreased mobility;Pain       PT Treatment Interventions Gait training;Functional mobility training;Patient/family education;Therapeutic exercise    PT Goals (Current goals can be found in the Care Plan section)  Acute Rehab PT Goals Patient Stated Goal: not stated PT Goal Formulation: With patient Time For Goal Achievement: 04/05/22 Potential to Achieve Goals: Good    Frequency Min 2X/week     Co-evaluation               AM-PAC PT "6 Clicks" Mobility  Outcome Measure Help needed turning from your back to your side while in a flat bed without using bedrails?: None Help needed moving from lying on your back to sitting on the side of a flat bed without using bedrails?: None Help needed moving to and from a bed to a chair (including a wheelchair)?: None Help needed standing up from a chair using your arms (e.g., wheelchair or bedside chair)?: None Help needed to walk in hospital room?: None Help needed climbing 3-5 steps with a railing? : A Little 6 Click Score: 23    End of Session   Activity Tolerance: Patient tolerated treatment well Patient left: in bed;with call bell/phone within reach Nurse Communication: Mobility status PT Visit Diagnosis: Pain;Other abnormalities of gait and mobility (R26.89) Pain - Right/Left: Right Pain - part of body: Hip    Time: 1152-1208 PT Time Calculation (min) (ACUTE ONLY): 16 min   Charges:   PT Evaluation $PT Eval Low Complexity: [place] 03/29/2022, 2:24 PM

## 2022-03-30 ENCOUNTER — Inpatient Hospital Stay (HOSPITAL_COMMUNITY): Payer: Medicaid Other | Admitting: Anesthesiology

## 2022-03-30 ENCOUNTER — Encounter (HOSPITAL_COMMUNITY): Payer: Self-pay | Admitting: Obstetrics and Gynecology

## 2022-03-30 ENCOUNTER — Encounter (HOSPITAL_COMMUNITY)
Admission: AD | Disposition: A | Discharge status: Home / Self Care | Payer: Self-pay | Source: Home / Self Care | Attending: Obstetrics & Gynecology

## 2022-03-30 DIAGNOSIS — Z3A38 38 weeks gestation of pregnancy: Secondary | ICD-10-CM

## 2022-03-30 DIAGNOSIS — O2412 Pre-existing diabetes mellitus, type 2, in childbirth: Secondary | ICD-10-CM

## 2022-03-30 DIAGNOSIS — O1414 Severe pre-eclampsia complicating childbirth: Secondary | ICD-10-CM

## 2022-03-30 DIAGNOSIS — O34211 Maternal care for low transverse scar from previous cesarean delivery: Secondary | ICD-10-CM

## 2022-03-30 DIAGNOSIS — Z3A28 28 weeks gestation of pregnancy: Secondary | ICD-10-CM

## 2022-03-30 DIAGNOSIS — O365930 Maternal care for other known or suspected poor fetal growth, third trimester, not applicable or unspecified: Secondary | ICD-10-CM

## 2022-03-30 DIAGNOSIS — Z30430 Encounter for insertion of intrauterine contraceptive device: Secondary | ICD-10-CM

## 2022-03-30 LAB — COMPREHENSIVE METABOLIC PANEL
ALT: 19 U/L (ref 0–44)
AST: 29 U/L (ref 15–41)
Albumin: 1.8 g/dL — ABNORMAL LOW (ref 3.5–5.0)
Alkaline Phosphatase: 88 U/L (ref 38–126)
Anion gap: 6 (ref 5–15)
BUN: 18 mg/dL (ref 6–20)
CO2: 21 mmol/L — ABNORMAL LOW (ref 22–32)
Calcium: 8.1 mg/dL — ABNORMAL LOW (ref 8.9–10.3)
Chloride: 107 mmol/L (ref 98–111)
Creatinine, Ser: 0.8 mg/dL (ref 0.44–1.00)
GFR, Estimated: >60 mL/min (ref >60)
Glucose, Bld: 87 mg/dL (ref 70–99)
Potassium: 4.7 mmol/L (ref 3.5–5.1)
Sodium: 134 mmol/L — ABNORMAL LOW (ref 135–145)
Total Bilirubin: 0.4 mg/dL (ref 0.3–1.2)
Total Protein: 5.3 g/dL — ABNORMAL LOW (ref 6.5–8.1)

## 2022-03-30 LAB — CBC
HCT: 29.2 % — ABNORMAL LOW (ref 36.0–46.0)
Hemoglobin: 9.8 g/dL — ABNORMAL LOW (ref 12.0–15.0)
MCH: 28.4 pg (ref 26.0–34.0)
MCHC: 33.6 g/dL (ref 30.0–36.0)
MCV: 84.6 fL (ref 80.0–100.0)
Platelets: 245 10*3/uL (ref 150–400)
RBC: 3.45 MIL/uL — ABNORMAL LOW (ref 3.87–5.11)
RDW: 13.9 % (ref 11.5–15.5)
WBC: 13.5 10*3/uL — ABNORMAL HIGH (ref 4.0–10.5)
nRBC: 0 % (ref 0.0–0.2)

## 2022-03-30 LAB — GLUCOSE, CAPILLARY
Glucose-Capillary: 115 mg/dL — ABNORMAL HIGH (ref 70–99)
Glucose-Capillary: 163 mg/dL — ABNORMAL HIGH (ref 70–99)
Glucose-Capillary: 92 mg/dL (ref 70–99)
Glucose-Capillary: 101 mg/dL — ABNORMAL HIGH (ref 70–99)
Glucose-Capillary: 97 mg/dL (ref 70–99)

## 2022-03-30 SURGERY — Surgical Case
Anesthesia: Spinal | Site: Abdomen

## 2022-03-30 MED ORDER — PHENYLEPHRINE HCL-NACL 20-0.9 MG/250ML-% IV SOLN
INTRAVENOUS | Status: AC
  Filled 2022-03-30: qty 250

## 2022-03-30 MED ORDER — OXYTOCIN-SODIUM CHLORIDE 30-0.9 UT/500ML-% IV SOLN: 2.5000 [IU]/h | INTRAVENOUS | Status: AC

## 2022-03-30 MED ORDER — OXYCODONE HCL 5 MG PO TABS
5.0000 mg | ORAL_TABLET | ORAL | Status: DC | PRN
  Administered 2022-03-31 (×2): 10 mg via ORAL
  Administered 2022-03-31: 5 mg via ORAL
  Administered 2022-03-31 – 2022-04-02 (×4): 10 mg via ORAL
  Filled 2022-03-30: qty 1
  Filled 2022-03-30 (×6): qty 2

## 2022-03-30 MED ORDER — FENTANYL CITRATE (PF) 100 MCG/2ML IJ SOLN
INTRAMUSCULAR | Status: DC | PRN
  Administered 2022-03-30: 15 ug via INTRATHECAL

## 2022-03-30 MED ORDER — LEVONORGESTREL 20 MCG/DAY IU IUD
1.0000 | INTRAUTERINE_SYSTEM | Freq: Once | INTRAUTERINE | Status: DC
  Filled 2022-03-30: qty 1

## 2022-03-30 MED ORDER — SODIUM CHLORIDE 0.9 % IV SOLN
INTRAVENOUS | Status: AC
  Filled 2022-03-30: qty 5

## 2022-03-30 MED ORDER — OXYTOCIN-SODIUM CHLORIDE 30-0.9 UT/500ML-% IV SOLN
INTRAVENOUS | Status: AC
  Filled 2022-03-30: qty 500

## 2022-03-30 MED ORDER — DIPHENHYDRAMINE HCL 25 MG PO CAPS: 25.0000 mg | ORAL_CAPSULE | Freq: Four times a day (QID) | ORAL | Status: DC | PRN

## 2022-03-30 MED ORDER — MORPHINE SULFATE (PF) 0.5 MG/ML IJ SOLN
INTRAMUSCULAR | Status: DC | PRN
  Administered 2022-03-30: 150 ug via INTRATHECAL

## 2022-03-30 MED ORDER — FUROSEMIDE 40 MG PO TABS
40.0000 mg | ORAL_TABLET | Freq: Two times a day (BID) | ORAL | Status: DC
  Administered 2022-03-31 – 2022-04-02 (×5): 40 mg via ORAL
  Filled 2022-03-30 (×5): qty 1

## 2022-03-30 MED ORDER — ACETAMINOPHEN 160 MG/5ML PO SOLN: 325.0000 mg | ORAL | Status: DC | PRN

## 2022-03-30 MED ORDER — ONDANSETRON HCL 4 MG/2ML IJ SOLN: 4.0000 mg | Freq: Once | INTRAMUSCULAR | Status: DC | PRN

## 2022-03-30 MED ORDER — MAGNESIUM SULFATE 40 GM/1000ML IV SOLN
2.0000 g/h | INTRAVENOUS | Status: AC
  Administered 2022-03-30 – 2022-03-31 (×2): 2 g/h via INTRAVENOUS
  Filled 2022-03-30 (×2): qty 1000

## 2022-03-30 MED ORDER — PHENYLEPHRINE HCL-NACL 20-0.9 MG/250ML-% IV SOLN
INTRAVENOUS | Status: DC | PRN
  Administered 2022-03-30: 30 ug/min via INTRAVENOUS

## 2022-03-30 MED ORDER — CEFAZOLIN IN SODIUM CHLORIDE 3-0.9 GM/100ML-% IV SOLN
3.0000 g | INTRAVENOUS | Status: AC
  Administered 2022-03-30: 3 g via INTRAVENOUS
  Filled 2022-03-30: qty 100

## 2022-03-30 MED ORDER — INSULIN ASPART 100 UNIT/ML IJ SOLN: 0.0000 [IU] | Freq: Three times a day (TID) | INTRAMUSCULAR | Status: DC

## 2022-03-30 MED ORDER — COCONUT OIL OIL: 1.0000 | TOPICAL_OIL | Status: DC | PRN

## 2022-03-30 MED ORDER — DIBUCAINE (PERIANAL) 1 % EX OINT: 1.0000 | TOPICAL_OINTMENT | CUTANEOUS | Status: DC | PRN

## 2022-03-30 MED ORDER — OXYTOCIN-SODIUM CHLORIDE 30-0.9 UT/500ML-% IV SOLN
INTRAVENOUS | Status: DC | PRN
  Administered 2022-03-30: 30 [IU] via INTRAVENOUS

## 2022-03-30 MED ORDER — MEPERIDINE HCL 25 MG/ML IJ SOLN: 6.2500 mg | INTRAMUSCULAR | Status: DC | PRN

## 2022-03-30 MED ORDER — ONDANSETRON HCL 4 MG/2ML IJ SOLN
INTRAMUSCULAR | Status: AC
  Filled 2022-03-30: qty 2

## 2022-03-30 MED ORDER — MAGNESIUM SULFATE BOLUS VIA INFUSION
4.0000 g | Freq: Once | INTRAVENOUS | Status: AC
  Administered 2022-03-30: 4 g via INTRAVENOUS
  Filled 2022-03-30: qty 1000

## 2022-03-30 MED ORDER — BUPIVACAINE IN DEXTROSE 0.75-8.25 % IT SOLN
INTRATHECAL | Status: DC | PRN
  Administered 2022-03-30: 1.6 mL via INTRATHECAL

## 2022-03-30 MED ORDER — PRENATAL MULTIVITAMIN CH
1.0000 | ORAL_TABLET | Freq: Every day | ORAL | Status: DC
  Administered 2022-04-01: 1 via ORAL
  Filled 2022-03-30 (×3): qty 1

## 2022-03-30 MED ORDER — LEVONORGESTREL 20 MCG/DAY IU IUD
INTRAUTERINE_SYSTEM | INTRAUTERINE | Status: AC
  Filled 2022-03-30: qty 1

## 2022-03-30 MED ORDER — MEASLES, MUMPS & RUBELLA VAC IJ SOLR: 0.5000 mL | Freq: Once | INTRAMUSCULAR | Status: DC

## 2022-03-30 MED ORDER — SOD CITRATE-CITRIC ACID 500-334 MG/5ML PO SOLN
ORAL | Status: AC
  Administered 2022-03-30: 30 mL
  Filled 2022-03-30: qty 30

## 2022-03-30 MED ORDER — MENTHOL 3 MG MT LOZG: 1.0000 | LOZENGE | OROMUCOSAL | Status: DC | PRN

## 2022-03-30 MED ORDER — METFORMIN HCL 500 MG PO TABS
1000.0000 mg | ORAL_TABLET | Freq: Two times a day (BID) | ORAL | Status: DC
  Administered 2022-03-31 – 2022-04-02 (×5): 1000 mg via ORAL
  Filled 2022-03-30 (×5): qty 2

## 2022-03-30 MED ORDER — DEXCOM G6 SENSOR MISC: 1.0000 | Status: DC

## 2022-03-30 MED ORDER — INSULIN ASPART 100 UNIT/ML IJ SOLN: 5.0000 [IU] | Freq: Three times a day (TID) | INTRAMUSCULAR | Status: DC

## 2022-03-30 MED ORDER — DEXAMETHASONE SODIUM PHOSPHATE 10 MG/ML IJ SOLN
INTRAMUSCULAR | Status: DC | PRN
  Administered 2022-03-30: 4 mg via INTRAVENOUS

## 2022-03-30 MED ORDER — ENOXAPARIN SODIUM 60 MG/0.6ML IJ SOSY
60.0000 mg | PREFILLED_SYRINGE | INTRAMUSCULAR | Status: DC
  Administered 2022-03-31 – 2022-04-02 (×3): 60 mg via SUBCUTANEOUS
  Filled 2022-03-30 (×3): qty 0.6

## 2022-03-30 MED ORDER — LACTATED RINGERS IV SOLN: INTRAVENOUS | Status: DC

## 2022-03-30 MED ORDER — SOD CITRATE-CITRIC ACID 500-334 MG/5ML PO SOLN: 30.0000 mL | ORAL | Status: DC

## 2022-03-30 MED ORDER — SENNOSIDES-DOCUSATE SODIUM 8.6-50 MG PO TABS
2.0000 | ORAL_TABLET | Freq: Every day | ORAL | Status: DC
  Administered 2022-03-31 – 2022-04-01 (×2): 2 via ORAL
  Filled 2022-03-30 (×3): qty 2

## 2022-03-30 MED ORDER — SIMETHICONE 80 MG PO CHEW
80.0000 mg | CHEWABLE_TABLET | ORAL | Status: DC | PRN
  Filled 2022-03-30: qty 1

## 2022-03-30 MED ORDER — CEFAZOLIN IN SODIUM CHLORIDE 3-0.9 GM/100ML-% IV SOLN
INTRAVENOUS | Status: AC
  Filled 2022-03-30: qty 100

## 2022-03-30 MED ORDER — OXYCODONE HCL 5 MG PO TABS: 5.0000 mg | ORAL_TABLET | Freq: Once | ORAL | Status: DC | PRN

## 2022-03-30 MED ORDER — ACETAMINOPHEN 325 MG PO TABS: 325.0000 mg | ORAL_TABLET | ORAL | Status: DC | PRN

## 2022-03-30 MED ORDER — WITCH HAZEL-GLYCERIN EX PADS: 1.0000 | MEDICATED_PAD | CUTANEOUS | Status: DC | PRN

## 2022-03-30 MED ORDER — ZOLPIDEM TARTRATE 5 MG PO TABS: 5.0000 mg | ORAL_TABLET | Freq: Every evening | ORAL | Status: DC | PRN

## 2022-03-30 MED ORDER — TETANUS-DIPHTH-ACELL PERTUSSIS 5-2.5-18.5 LF-MCG/0.5 IM SUSY: 0.5000 mL | PREFILLED_SYRINGE | Freq: Once | INTRAMUSCULAR | Status: DC

## 2022-03-30 MED ORDER — FENTANYL CITRATE (PF) 100 MCG/2ML IJ SOLN
INTRAMUSCULAR | Status: AC
  Filled 2022-03-30: qty 2

## 2022-03-30 MED ORDER — MORPHINE SULFATE (PF) 0.5 MG/ML IJ SOLN
INTRAMUSCULAR | Status: AC
  Filled 2022-03-30: qty 10

## 2022-03-30 MED ORDER — SODIUM CHLORIDE 0.9 % IV SOLN: 500.0000 mg | INTRAVENOUS | Status: DC

## 2022-03-30 MED ORDER — ONDANSETRON HCL 4 MG/2ML IJ SOLN
INTRAMUSCULAR | Status: DC | PRN
  Administered 2022-03-30: 4 mg via INTRAVENOUS

## 2022-03-30 MED ORDER — OXYCODONE HCL 5 MG/5ML PO SOLN: 5.0000 mg | Freq: Once | ORAL | Status: DC | PRN

## 2022-03-30 MED ORDER — FENTANYL CITRATE (PF) 100 MCG/2ML IJ SOLN: 25.0000 ug | INTRAMUSCULAR | Status: DC | PRN

## 2022-03-30 MED ORDER — SIMETHICONE 80 MG PO CHEW
80.0000 mg | CHEWABLE_TABLET | Freq: Three times a day (TID) | ORAL | Status: DC
  Administered 2022-03-31 (×2): 80 mg via ORAL
  Filled 2022-03-30 (×4): qty 1

## 2022-03-30 MED ORDER — LEVONORGESTREL 20 MCG/DAY IU IUD
1.0000 | INTRAUTERINE_SYSTEM | Freq: Once | INTRAUTERINE | Status: AC
  Administered 2022-03-30: 1 via INTRAUTERINE

## 2022-03-30 SURGICAL SUPPLY — 40 items
BENZOIN TINCTURE PRP APPL 2/3 (GAUZE/BANDAGES/DRESSINGS) IMPLANT
CHLORAPREP W/TINT 26 (MISCELLANEOUS) ×2 IMPLANT
CLAMP UMBILICAL CORD (MISCELLANEOUS) ×1 IMPLANT
CLOTH BEACON ORANGE TIMEOUT ST (SAFETY) ×1 IMPLANT
DERMABOND ADVANCED .7 DNX12 (GAUZE/BANDAGES/DRESSINGS) ×2 IMPLANT
DRSG OPSITE POSTOP 4X10 (GAUZE/BANDAGES/DRESSINGS) ×1 IMPLANT
ELECT REM PT RETURN 9FT ADLT (ELECTROSURGICAL) ×1
ELECTRODE REM PT RTRN 9FT ADLT (ELECTROSURGICAL) ×1 IMPLANT
EXTRACTOR VACUUM BELL STYLE (SUCTIONS) IMPLANT
GAUZE SPONGE 4X4 12PLY STRL LF (GAUZE/BANDAGES/DRESSINGS) IMPLANT
GLOVE BIOGEL PI IND STRL 7.0 (GLOVE) ×1 IMPLANT
GLOVE BIOGEL PI IND STRL 8 (GLOVE) ×1 IMPLANT
GLOVE ECLIPSE 8.0 STRL XLNG CF (GLOVE) ×1 IMPLANT
GOWN STRL REUS W/TWL LRG LVL3 (GOWN DISPOSABLE) ×2 IMPLANT
KIT ABG SYR 3ML LUER SLIP (SYRINGE) ×1 IMPLANT
NDL HYPO 18GX1.5 BLUNT FILL (NEEDLE) ×1 IMPLANT
NDL HYPO 25X5/8 SAFETYGLIDE (NEEDLE) ×1 IMPLANT
NEEDLE HYPO 18GX1.5 BLUNT FILL (NEEDLE) ×1 IMPLANT
NEEDLE HYPO 22GX1.5 SAFETY (NEEDLE) ×1 IMPLANT
NEEDLE HYPO 25X5/8 SAFETYGLIDE (NEEDLE) ×1 IMPLANT
NS IRRIG 1000ML POUR BTL (IV SOLUTION) ×1 IMPLANT
PACK C SECTION WH (CUSTOM PROCEDURE TRAY) ×1 IMPLANT
PAD ABD 7.5X8 STRL (GAUZE/BANDAGES/DRESSINGS) IMPLANT
PAD OB MATERNITY 4.3X12.25 (PERSONAL CARE ITEMS) ×1 IMPLANT
RTRCTR C-SECT PINK 25CM LRG (MISCELLANEOUS) IMPLANT
STRIP CLOSURE SKIN 1/2X4 (GAUZE/BANDAGES/DRESSINGS) IMPLANT
SUT CHROMIC 0 CT 1 (SUTURE) ×1 IMPLANT
SUT MNCRL 0 VIOLET CTX 36 (SUTURE) ×2 IMPLANT
SUT MONOCRYL 0 CTX 36 (SUTURE) ×2
SUT PLAIN 2 0 (SUTURE)
SUT PLAIN 2 0 XLH (SUTURE) IMPLANT
SUT PLAIN ABS 2-0 CT1 27XMFL (SUTURE) IMPLANT
SUT VIC AB 0 CT1 27 (SUTURE) ×1
SUT VIC AB 0 CT1 27XBRD ANBCTR (SUTURE) IMPLANT
SUT VIC AB 0 CTX 36 (SUTURE) ×1
SUT VIC AB 0 CTX36XBRD ANBCTRL (SUTURE) ×1 IMPLANT
SUT VIC AB 4-0 KS 27 (SUTURE) IMPLANT
TOWEL OR 17X24 6PK STRL BLUE (TOWEL DISPOSABLE) ×1 IMPLANT
TRAY FOLEY W/BAG SLVR 14FR LF (SET/KITS/TRAYS/PACK) IMPLANT
WATER STERILE IRR 1000ML POUR (IV SOLUTION) ×1 IMPLANT

## 2022-03-30 NOTE — Anesthesia Preprocedure Evaluation (Signed)
Anesthesia Evaluation  Patient identified by MRN, date of birth, ID band Patient awake    Reviewed: Allergy & Precautions, H&P , NPO status , Patient's Chart, lab work & pertinent test results, reviewed documented beta blocker date and time   Airway Mallampati: III  TM Distance: >3 FB Neck ROM: full    Dental no notable dental hx. (+) Chipped, Dental Advisory Given, Poor Dentition   Pulmonary neg pulmonary ROS, asthma , former smoker   Pulmonary exam normal breath sounds clear to auscultation       Cardiovascular hypertension, Pt. on medications and Pt. on home beta blockers negative cardio ROS Normal cardiovascular exam Rhythm:regular Rate:Normal     Neuro/Psych   Anxiety     negative neurological ROS  negative psych ROS   GI/Hepatic negative GI ROS, Neg liver ROS,,,  Endo/Other  negative endocrine ROSdiabetes, Type 2    Renal/GU negative Renal ROS  negative genitourinary   Musculoskeletal   Abdominal   Peds  Hematology  (+) Blood dyscrasia, Sickle cell trait and anemia   Anesthesia Other Findings   Reproductive/Obstetrics (+) Pregnancy                             Anesthesia Physical Anesthesia Plan  ASA: 3 and emergent  Anesthesia Plan: Spinal   Post-op Pain Management:    Induction: Intravenous  PONV Risk Score and Plan: 2 and Treatment may vary due to age or medical condition  Airway Management Planned: Natural Airway  Additional Equipment: None  Intra-op Plan:   Post-operative Plan:   Informed Consent: I have reviewed the patients History and Physical, chart, labs and discussed the procedure including the risks, benefits and alternatives for the proposed anesthesia with the patient or authorized representative who has indicated his/her understanding and acceptance.       Plan Discussed with: Anesthesiologist and CRNA  Anesthesia Plan Comments:         Anesthesia Quick Evaluation

## 2022-03-30 NOTE — Discharge Summary (Signed)
Postpartum Discharge Summary  Date of Service updated     Patient Name: Jane Schultz DOB: 14-Dec-1980 MRN: [id number]  Date of admission: 03/18/2022 Delivery date:03/30/2022  Delivering provider: Florian Buff  Date of discharge: 04/02/2022  Admitting diagnosis: Elevated blood pressure complicating pregnancy, antepartum, second trimester [O16.2] Intrauterine pregnancy: [redacted]w[redacted]d    Secondary diagnosis:  Principal Problem:   Elevated blood pressure complicating pregnancy, antepartum, second trimester Active Problems:   Type 2 diabetes mellitus without complication ([facility]   Deaf   Supervision of high risk pregnancy in second trimester   Chronic hypertension affecting pregnancy   Multigravida of advanced maternal age in second trimester   History of cesarean delivery  Additional problems: Superimposed preeclampsia with severe features    Discharge diagnosis: Preterm Pregnancy Delivered and Preeclampsia (severe)                                              Post partum procedures: None  Complications: None  Hospital course: Sceduled C/S   41y.o. yo G2P0202 at 246w5das admitted to the hospital 03/18/2022 for elevated blood pressures concerning for SIPE. Her blood pressures were controlled on two agents until maximum doses were reached. She then had acute worsening of her blood pressures such that she required multiple doses of IV medications with minimal improvement. Delivery was thus recommended. She had her cesarean delivery for the following indication: repeat cesarean in the setting of worsening preeclampsia w/ severe features . She declined TOLAC. She had a ppIUD placed (mirena) at the time of the cesarean section.   Delivery details are as follows:  Membrane Rupture Time/Date: 6:34 PM ,03/30/2022   Delivery Method:C-Section, Low Transverse  Details of operation can be found in separate operative note.  Patient had a postpartum course complicated by preeclampsia and  pre-existing diabetes. She had Magnesium for 24 hours following delivery.  Her blood pressures were moderately well controlled on Nifedipine and Lisinopril. She was on lasix to help with her pre-existing swelling. For her CBGs, she was started on Metformin and Insulin again following delivery.  She is ambulating, tolerating a regular diet, passing flatus, and urinating well. Patient is discharged home in stable condition on  04/02/22        Newborn Data: Birth date:03/30/2022  Birth time:6:36 PM  Gender:Female  Living status:Living  Apgars:3 ,8  Weight:1070 g     Magnesium Sulfate received: Yes: Neuroprotection and seizure prophylaxis  BMZ received: No Rhophylac:N/A MMR:No - Immune T-DaP:Given prenatally Flu: Given prenatally Transfusion:No  Physical exam  Vitals:   04/01/22 2300 04/02/22 0446 04/02/22 0835 04/02/22 1120  BP: (!) 138/57 (!) 146/66 133/60 (!) 138/59  Pulse: 79 78 79 76  Resp: _0 Temp: 98.5 F (36.9 C) 98.5 F (36.9 C) 98.3 F (36.8 C) 98.4 F (36.9 C)  TempSrc: Oral Oral Oral Oral  SpO2: 99% 95% 99% 97%  Weight:      Height:       General: alert, cooperative, and no distress Lochia: appropriate Uterine Fundus: firm Incision: Healing well with no significant drainage, No significant erythema DVT Evaluation: No cords or calf tenderness. Calf/Ankle edema is present Labs: Lab Results  Component Value Date   WBC 15.6 (H) 03/31/2022   HGB 7.8 (L) 03/31/2022   HCT 23.5 (L) 03/31/2022   MCV 86.1 03/31/2022   PLT 265 03/31/2022  Latest Ref Rng & Units 03/31/2022    3:59 PM  CMP  Glucose 70 - 99 mg/dL 133   BUN 6 - 20 mg/dL 18   Creatinine 0.44 - 1.00 mg/dL 0.90   Sodium 135 - 145 mmol/L 132   Potassium 3.5 - 5.1 mmol/L 4.2   Chloride 98 - 111 mmol/L 103   CO2 22 - 32 mmol/L 24   Calcium 8.9 - 10.3 mg/dL 7.0   Total Protein 6.5 - 8.1 g/dL 4.8   Total Bilirubin 0.3 - 1.2 mg/dL 0.2   Alkaline Phos 38 - 126 U/L 71   AST 15 - 41 U/L 23    ALT 0 - 44 U/L 15    Edinburgh Score:    03/31/2022    3:26 PM  Edinburgh Postnatal Depression Scale Screening Tool  I have been able to laugh and see the funny side of things. 2  I have looked forward with enjoyment to things. 2  I have blamed myself unnecessarily when things went wrong. 2  I have been anxious or worried for no good reason. 2  I have felt scared or panicky for no good reason. 0  Things have been getting on top of me. 1  I have been so unhappy that I have had difficulty sleeping. 2  I have felt sad or miserable. 1  I have been so unhappy that I have been crying. 1  The thought of harming myself has occurred to me. 0  Edinburgh Postnatal Depression Scale Total 13     After visit meds:  Allergies as of 04/02/2022   No Known Allergies      Medication List     STOP taking these medications    aspirin EC 81 MG tablet   blood glucose meter kit and supplies Kit   Blood Pressure Cuff Misc   insulin aspart 100 UNIT/ML injection Commonly known as: novoLOG   insulin glargine-yfgn 100 UNIT/ML injection Commonly known as: SEMGLEE   insulin lispro 100 UNIT/ML KwikPen Commonly known as: HUMALOG   lactated ringers infusion   magnesium sulfate 2 GM/50ML Soln infusion   metoprolol tartrate 25 MG tablet Commonly known as: LOPRESSOR       TAKE these medications    Basaglar KwikPen 100 UNIT/ML Inject 10 Units into the skin daily. What changed:  medication strength how much to take   cyclobenzaprine 10 MG tablet Commonly known as: FLEXERIL Take 1 tablet (10 mg total) by mouth 3 (three) times daily as needed for muscle spasms.   Dexcom G6 Receiver Devi Check blood sugars 4 x daily (fasting and 2 hours postprandial)   Dexcom G6 Sensor Misc Check blood sugars 4 x daily (fasting and 2 hours postprandial)   Dexcom G6 Transmitter Misc Check blood sugars 4 x daily (fasting and 2 hours postprandial)   furosemide 40 MG tablet Commonly known as:  LASIX Take 1 tablet (40 mg total) by mouth 2 (two) times daily for 7 days. What changed:  medication strength how much to take when to take this   ibuprofen 600 MG tablet Commonly known as: ADVIL Take 1 tablet (600 mg total) by mouth every 8 (eight) hours as needed for moderate pain or cramping.   levonorgestrel 20 MCG/DAY Iud Commonly known as: MIRENA 1 each by Intrauterine route once for 1 dose.   lisinopril 10 MG tablet Commonly known as: ZESTRIL Take 1 tablet (10 mg total) by mouth daily.   metFORMIN 1000 MG tablet Commonly known as: GLUCOPHAGE  Take 1 tablet (1,000 mg total) by mouth 2 (two) times daily with a meal. What changed:  medication strength how much to take   NIFEdipine 60 MG 24 hr tablet Commonly known as: ADALAT CC Take 1 tablet (60 mg total) by mouth 2 (two) times daily. What changed:  medication strength how much to take when to take this   oxyCODONE 5 MG immediate release tablet Commonly known as: Oxy IR/ROXICODONE Take 1-2 tablets (5-10 mg total) by mouth every 4 (four) hours as needed for moderate pain.   Prenatal Adult Gummy/DHA/FA 0.4-25 MG Chew Chew 1 each by mouth daily.               Discharge Care Instructions  (From admission, onward)           Start     Ordered   04/02/22 0000  Discharge wound care:       Comments: If dressing is present, remove after 5 days from surgery   04/02/22 1229             Discharge home in stable condition Infant Feeding: Breast Infant Disposition:NICU Discharge instruction: per After Visit Summary and Postpartum booklet. Activity: Advance as tolerated. Pelvic rest for 6 weeks.  Diet: carb modified diet Future Appointments: Future Appointments  Date Time Provider [place]  04/06/2022  1:30 PM Rubie Maid, MD [facility] None  04/26/2022  2:00 PM Wellington Hampshire, MD [facility] None   Follow up Visit:  [place] for [place] at [facility]  Follow up in 1 week(s).   Specialty: Obstetrics and Gynecology Why: For  blood pressure and incision check. Contact information: [place] [phone]               A message was sent to [facility] for her follow up.   Please schedule this patient for a In person postpartum visit in 4 weeks with the following provider: Any provider. Additional Postpartum F/U:Incision check 1 week and BP check 1 week  High risk pregnancy complicated by: HTN Delivery mode:  C-Section, Low Transverse  Anticipated Birth Control:  PP IUD placed Baby delivered at 28 weeks and is in the NICU   04/02/2022 Radene Gunning, MD

## 2022-03-30 NOTE — Progress Notes (Signed)
CSW received a return call from [employer] [facility] [employer] [phone]) staff member (Paige Sprinkle). Staff reported that there is an income based program (equipment distribution) through the state's Health and Human Services that may be able to assist patient. Staff provided contact information [phone]).  CSW contacted Deaf and Hard of Hearing Division of Services, no answer. CSW left voicemail requesting return phone call.   Abundio Miu, [place] Worker [facility] Cell#: [phone]

## 2022-03-30 NOTE — Progress Notes (Signed)
Pt has needed multiple doses of IV labetalol with severe range blood pressures despite 40 mg. She required 80 mg, but only got 40 when IV infiltrated. New IV access obtained. She then had additional severe range blood pressures. She is on maximum dose on 2 different PO medications. Plan of care has been if she needed multiple doses of IV medications despite maximum PO meds then we would move to delivery.   Discussed need for delivery. Pt voices understanding.   Started Magnesium and labs rechecked. Teams all notified.   Discussed with pt the options for MOD. - We discussed the risks associated with repeat c-section: bleeding, infection, injury to surrounding organs/tissues I.e. bowel/bladder, development of scar tissue, wound complications such as wound separation or infection, need for additional surgery, percreta/acreta - We discussed the risks associated with TOLAC: risk of it being unsuccessful, specially in the context of her history, the risks in general of a vaginal delivery (prolapse, SUI, differences in recovery, pelvic floor dysfunction, etc), and the risk of uterine rupture. We discussed with the risk of uterine rupture that while rare it is not easily predicted, that it is a surgical emergency, and it can be potentially catastrophic for mom and baby. We discussed if uterine rupture that it may necessitate hysterectomy if the rupture caused issues with bleeding that could not be managed with other surgical options.  - After counseling, the patient was given the opportunity to ask questions and all questions answered.  - After considering her options, she would like to have a repeat c-section - Information provided to the patient  The patient concurred with the proposed plan, giving informed written consent for the procedure.   She will remain NPO for procedure. Anesthesia and OR aware. Preoperative prophylactic antibiotics and SCDs ordered on call to the OR.  To OR when ready and when FOB  Keven arrives and is available.   Radene Gunning, MD Attending [place], [facility] for [facility], [place]

## 2022-03-30 NOTE — Op Note (Signed)
Cesarean Section Procedure Note   Jane Schultz  03/30/2022  Indications: previous uterine incision LTCS and severe preeclampsia  Pre-operative Diagnosis: severe preeclampsia uncontrolled hypertension.   Post-operative Diagnosis: Same   Surgeon: Surgeon(s) and Role:    * Eure, Mertie Clause, MD - Primary    * Caron Presume, Angelyn Punt, MD - Fellow   Attending Attestation: I was present and scrubbed for the key portions of the procedure.   Assistants: none  Anesthesia: spinal    Estimated Blood Loss: 600  Total IV Fluids: 893m   Urine Output:  300 CC OF clear urine  Specimens:None  Findings:  Baby condition / location:  NICU 1050 gm APGAR:3 , 8 ; weight  .     Complications: no complications  Indications: Jane Schultz a 41y.o. [id number] an IUP 277w5dresenting with pre E w/ severe features, IUGR, previous CS.  The risks, benefits, complications, treatment options, and expected outcomes were discussed with the patient . The patient concurred with the proposed plan, giving informed consent. identified as Jane Schultz and the procedure verified as C-Section Delivery.  Procedure Details: A Time Out was held and the above information confirmed.  The patient was taken back to the operative suite where spinal anesthesia was placed.  After induction of anesthesia, the patient was draped and prepped in the usual sterile manner and placed in a dorsal supine position with a leftward tilt. A transverse was made and carried down through the subcutaneous tissue to the fascia. Fascial incision was made and extended transversely. The fascia was separated from the underlying rectus tissue superiorly and inferiorly. The peritoneum was identified and entered. Peritoneal incision was extended longitudinally. The utero-vesical peritoneal reflection was incised transversely and the bladder flap was bluntly freed from the lower uterine segment. A low transverse  uterine incision was made. Delivered from double footling breech presentation was a 1070 gram Female with Apgar scores of 3 at one minute and 8 at five minutes. Cord ph was sent the umbilical cord was clamped and cut cord blood was obtained for evaluation. The placenta was removed Intact and appeared normal. The uterine outline, tubes and ovaries appeared normal}. A Mirena IUD was then removed from it's packaging in a sterile manner and the strings were trimmed to approximately 10 cm. The IUD was placed manually at the uterine fundus and the strings were passed through the cervical os with a Kelly clamp. The uterine incision was closed with running locked sutures of 0 Monocryl. A second embrocated layer was done to achieve hemostasis.  Hemostasis was observed. Lavage was carried out until clear. The fascia was then reapproximated with running sutures of 0Vicryl. The subcuticular closure was performed using 2-0plain gut. The skin was closed with 4-0Vicryl.   Instrument, sponge, and needle counts were correct prior the abdominal closure and were correct at the conclusion of the case.     Disposition: PACU - hemodynamically stable.   Maternal Condition: stable       Signed: JoMardene Celeste2/13/2023 7:42 PM

## 2022-03-30 NOTE — Inpatient Diabetes Management (Signed)
Inpatient Diabetes Program Recommendations  Diabetes Treatment Program Recommendations  ADA Standards of Care Diabetes in Pregnancy Target Glucose Ranges:  Fasting: 70 - 95 mg/dL 1 hr postprandial: Less than '140mg'$ /dL (from first bite of meal) 2 hr postprandial: Less than 120 mg/dL (from first bite of meal)    Lab Results  Component Value Date   GLUCAP 115 (H) 03/30/2022   HGBA1C 6.3 (H) 02/10/2022    Review of Glycemic Control  Latest Reference Range & Units 03/29/22 10:46 03/29/22 18:20 03/29/22 20:57 03/30/22 03:57  Glucose-Capillary 70 - 99 mg/dL 103 (H) 129 (H) 144 (H) 115 (H)  (H): Data is abnormally high   Hx: DM2   Home DM Meds: Lantus 40 units Daily                              Lantus 20 units QHS                              Metformin 850 mg BID                              Humalog 7 units TID   Current Orders: Semglee 35 units QD, Novolog 3 units TID, Novolog 0-14 units TID  Inpatient Diabetes Program Recommendations:    Could consider increasing Novolog 5 units TID (assuming patient is consuming >50% of meals).   Thanks, Bronson Curb, MSN, RNC-OB Diabetes Coordinator [phone] (8a-5p)

## 2022-03-30 NOTE — Anesthesia Postprocedure Evaluation (Signed)
Anesthesia Post Note  Patient: Jane Schultz  Procedure(s) Performed: CESAREAN SECTION (Abdomen)     Patient location during evaluation: PACU Anesthesia Type: Spinal Level of consciousness: awake and alert and oriented Pain management: pain level controlled Vital Signs Assessment: post-procedure vital signs reviewed and stable Respiratory status: spontaneous breathing, nonlabored ventilation and respiratory function stable Cardiovascular status: blood pressure returned to baseline and stable Postop Assessment: no headache, no backache, spinal receding and patient able to bend at knees Anesthetic complications: no Comments: Hypertensive at baseline 150-160 SBP   No notable events documented.  Last Vitals:  Vitals:   03/30/22 1702 03/30/22 1945  BP: (!) 151/64 (!) 141/75  Pulse: 77 72  Resp: 20   Temp:  36.7 C  SpO2: 99% 100%    Last Pain:  Vitals:   03/30/22 1945  TempSrc:   PainSc: 0-No pain                 Pervis Hocking

## 2022-03-30 NOTE — Transfer of Care (Signed)
Immediate Anesthesia Transfer of Care Note  Patient: Jane Schultz  Procedure(s) Performed: CESAREAN SECTION (Abdomen)  Patient Location: PACU  Anesthesia Type:Spinal  Level of Consciousness: awake, alert , oriented, and patient cooperative  Airway & Oxygen Therapy: Patient Spontanous Breathing  Post-op Assessment: Report given to RN and Post -op Vital signs reviewed and stable  Post vital signs: Reviewed and stable  Last Vitals:  Vitals Value Taken Time  BP    Temp    Pulse 73 03/30/22 1948  Resp 15 03/30/22 1948  SpO2 98 % 03/30/22 1948  Vitals shown include unvalidated device data.  Last Pain:  Vitals:   03/30/22 1720  TempSrc:   PainSc: 0-No pain      Patients Stated Pain Goal: 3 (31/28/11 8867)  Complications: No notable events documented.

## 2022-03-30 NOTE — Lactation Note (Signed)
This note was copied from a baby's chart. Lactation Consultation Note  Patient Name: Girl Tris Howell Today's Date: 03/30/2022   Age:40 hours  LC orders received and acknowledged. Patient still in the PACU at the end of this [place] shift, NICU LC to F/U on 04/01/22 for initial assessment.   Darlisha Kelm S Coolidge Gossard 03/30/2022, 7:04 PM

## 2022-03-30 NOTE — Anesthesia Procedure Notes (Signed)
Spinal  Patient location during procedure: OR Start time: 03/30/2022 6:10 PM End time: 03/30/2022 6:13 PM Reason for block: surgical anesthesia Staffing Anesthesiologist: Janeece Riggers, MD Performed by: Janeece Riggers, MD Authorized by: Janeece Riggers, MD   Preanesthetic Checklist Completed: patient identified, IV checked, site marked, risks and benefits discussed, surgical consent, monitors and equipment checked, pre-op evaluation and timeout performed Spinal Block Patient position: sitting Prep: DuraPrep Patient monitoring: heart rate, cardiac monitor, continuous pulse ox and blood pressure Approach: midline Location: L2-3 Injection technique: single-shot Needle Needle type: Sprotte  Needle gauge: 24 G Needle length: 9 cm Assessment Sensory level: T4 Events: CSF return

## 2022-03-30 NOTE — Progress Notes (Signed)
PT Cancellation Note  Patient Details Name: Jane Schultz MRN: [id number] DOB: 1981-02-28   Cancelled Treatment:    Reason Eval/Treat Not Completed: Medical issues which prohibited therapy. Per nurse pt will deliver today.    Shary Decamp [facility] 03/30/2022, 3:02 PM [place] Office [phone]
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
## 2022-03-31 ENCOUNTER — Encounter (HOSPITAL_COMMUNITY): Payer: Self-pay | Admitting: Obstetrics & Gynecology

## 2022-03-31 LAB — COMPREHENSIVE METABOLIC PANEL
ALT: 19 U/L (ref 0–44)
ALT: 15 U/L (ref 0–44)
AST: 28 U/L (ref 15–41)
AST: 23 U/L (ref 15–41)
Albumin: 1.7 g/dL — ABNORMAL LOW (ref 3.5–5.0)
Albumin: 1.6 g/dL — ABNORMAL LOW (ref 3.5–5.0)
Alkaline Phosphatase: 81 U/L (ref 38–126)
Alkaline Phosphatase: 71 U/L (ref 38–126)
Anion gap: 8 (ref 5–15)
Anion gap: 5 (ref 5–15)
BUN: 15 mg/dL (ref 6–20)
BUN: 18 mg/dL (ref 6–20)
CO2: 20 mmol/L — ABNORMAL LOW (ref 22–32)
CO2: 24 mmol/L (ref 22–32)
Calcium: 7.3 mg/dL — ABNORMAL LOW (ref 8.9–10.3)
Calcium: 7 mg/dL — ABNORMAL LOW (ref 8.9–10.3)
Chloride: 103 mmol/L (ref 98–111)
Chloride: 103 mmol/L (ref 98–111)
Creatinine, Ser: 0.89 mg/dL (ref 0.44–1.00)
Creatinine, Ser: 0.9 mg/dL (ref 0.44–1.00)
GFR, Estimated: >60 mL/min (ref >60)
GFR, Estimated: >60 mL/min (ref >60)
Glucose, Bld: 181 mg/dL — ABNORMAL HIGH (ref 70–99)
Glucose, Bld: 133 mg/dL — ABNORMAL HIGH (ref 70–99)
Potassium: 4.9 mmol/L (ref 3.5–5.1)
Potassium: 4.2 mmol/L (ref 3.5–5.1)
Sodium: 131 mmol/L — ABNORMAL LOW (ref 135–145)
Sodium: 132 mmol/L — ABNORMAL LOW (ref 135–145)
Total Bilirubin: 0.3 mg/dL (ref 0.3–1.2)
Total Bilirubin: 0.2 mg/dL — ABNORMAL LOW (ref 0.3–1.2)
Total Protein: 5 g/dL — ABNORMAL LOW (ref 6.5–8.1)
Total Protein: 4.8 g/dL — ABNORMAL LOW (ref 6.5–8.1)

## 2022-03-31 LAB — CBC
HCT: 25.2 % — ABNORMAL LOW (ref 36.0–46.0)
HCT: 23.5 % — ABNORMAL LOW (ref 36.0–46.0)
Hemoglobin: 8.8 g/dL — ABNORMAL LOW (ref 12.0–15.0)
Hemoglobin: 7.8 g/dL — ABNORMAL LOW (ref 12.0–15.0)
MCH: 29.3 pg (ref 26.0–34.0)
MCH: 28.6 pg (ref 26.0–34.0)
MCHC: 34.9 g/dL (ref 30.0–36.0)
MCHC: 33.2 g/dL (ref 30.0–36.0)
MCV: 84 fL (ref 80.0–100.0)
MCV: 86.1 fL (ref 80.0–100.0)
Platelets: 266 10*3/uL (ref 150–400)
Platelets: 265 10*3/uL (ref 150–400)
RBC: 3 MIL/uL — ABNORMAL LOW (ref 3.87–5.11)
RBC: 2.73 MIL/uL — ABNORMAL LOW (ref 3.87–5.11)
RDW: 13.7 % (ref 11.5–15.5)
RDW: 13.9 % (ref 11.5–15.5)
WBC: 19.3 10*3/uL — ABNORMAL HIGH (ref 4.0–10.5)
WBC: 15.6 10*3/uL — ABNORMAL HIGH (ref 4.0–10.5)
nRBC: 0 % (ref 0.0–0.2)
nRBC: 0 % (ref 0.0–0.2)

## 2022-03-31 LAB — GLUCOSE, CAPILLARY
Glucose-Capillary: 205 mg/dL — ABNORMAL HIGH (ref 70–99)
Glucose-Capillary: 166 mg/dL — ABNORMAL HIGH (ref 70–99)
Glucose-Capillary: 179 mg/dL — ABNORMAL HIGH (ref 70–99)
Glucose-Capillary: 133 mg/dL — ABNORMAL HIGH (ref 70–99)
Glucose-Capillary: 177 mg/dL — ABNORMAL HIGH (ref 70–99)
Glucose-Capillary: 178 mg/dL — ABNORMAL HIGH (ref 70–99)

## 2022-03-31 LAB — MAGNESIUM
Magnesium: 4.9 mg/dL — ABNORMAL HIGH (ref 1.7–2.4)
Magnesium: 5 mg/dL — ABNORMAL HIGH (ref 1.7–2.4)

## 2022-03-31 MED ORDER — NIFEDIPINE ER OSMOTIC RELEASE 60 MG PO TB24
60.0000 mg | ORAL_TABLET | Freq: Two times a day (BID) | ORAL | Status: DC
  Administered 2022-03-31 – 2022-04-02 (×4): 60 mg via ORAL
  Filled 2022-03-31 (×4): qty 1

## 2022-03-31 MED ORDER — KETOROLAC TROMETHAMINE 30 MG/ML IJ SOLN
30.0000 mg | Freq: Three times a day (TID) | INTRAMUSCULAR | Status: AC
  Administered 2022-03-31 – 2022-04-01 (×3): 30 mg via INTRAVENOUS
  Filled 2022-03-31 (×4): qty 1

## 2022-03-31 MED ORDER — CYCLOBENZAPRINE HCL 10 MG PO TABS
10.0000 mg | ORAL_TABLET | Freq: Three times a day (TID) | ORAL | Status: DC | PRN
  Administered 2022-03-31 – 2022-04-02 (×3): 10 mg via ORAL
  Filled 2022-03-31 (×3): qty 1

## 2022-03-31 MED ORDER — IBUPROFEN 600 MG PO TABS
600.0000 mg | ORAL_TABLET | Freq: Three times a day (TID) | ORAL | Status: DC | PRN
  Administered 2022-04-02: 600 mg via ORAL
  Filled 2022-03-31: qty 1

## 2022-03-31 NOTE — Progress Notes (Signed)
Attempted to ambulate patient. Patient was unable to support her own weight. Patient's BP dropped from lying to sitting, and patient stated feeling hot and dizzy. Dr. Damita Dunnings made aware of inability to ambulate and foley catheter remains intact for duration of Magnesium Sulfate.

## 2022-03-31 NOTE — Inpatient Diabetes Management (Signed)
Inpatient Diabetes Program Recommendations  AACE/ADA: New Consensus Statement on Inpatient Glycemic Control (2015)  Target Ranges:  Prepandial:   less than 140 mg/dL      Peak postprandial:   less than 180 mg/dL (1-2 hours)      Critically ill patients:  140 - 180 mg/dL   Lab Results  Component Value Date   GLUCAP 166 (H) 03/31/2022   HGBA1C 6.3 (H) 02/10/2022    Review of Glycemic Control  Latest Reference Range & Units 03/30/22 17:42 03/30/22 20:07 03/31/22 01:11 03/31/22 05:00  Glucose-Capillary 70 - 99 mg/dL 101 (H) 97 205 (H) 166 (H)  (H): Data is abnormally high Hx: DM2   Home DM Meds: Lantus 40 units Daily                              Lantus 20 units QHS                              Metformin 850 mg BID                              Humalog 7 units TID   Current Orders: Metformin 1000 mg BID  Decadron 4 mg x 1   Inpatient Diabetes Program Recommendations:    Consider adding Novolog 0-9 units TID & HS.   Thanks, Bronson Curb, MSN, RNC-OB Diabetes Coordinator [phone] (8a-5p)

## 2022-03-31 NOTE — Progress Notes (Signed)
POD1 s/p RLTCS, ppIUD insertion due to worsening SIPE  Subjective: Notes right hip pain but otherwise denies any issues.   Objective: Blood pressure (!) 141/64, pulse 68, temperature 98 F (36.7 C), temperature source Oral, resp. rate 17, height '5\' 7"'$  (1.702 m), weight 121.3 kg, last menstrual period 09/02/2021, SpO2 100 %, unknown if currently breastfeeding.  Physical Exam:  General: alert, cooperative, and no distress Lochia: appropriate Uterine Fundus: firm Incision: healing well, no significant drainage, no dehiscence, no significant erythema DVT Evaluation: Calf/Ankle edema is present.  Recent Labs    03/30/22 1435 03/31/22 0459  HGB 9.8* 8.8*  HCT 29.2* 25.2*    Assessment/Plan: Postpartum - Contraception: ppIUD (Mirena) - MOF: Breast - Rh status: Positive - Rubella status: Immune - Dispo: Most likely on POD3 - Consults: Lactation, DM education  Right hip pain - Discussed would give it 2-3 weeks for swelling to improve, impact from surgery/pregnancy to alleviate and for her to no longer be in hospital bed. If still present would advise she follow up with PCP. She also notes she would like new PCP. She lives in [place]. Will work on finding options for her [place] area.   Preeclampsia - Continue Mag x24 hours. Stop time placed.  - Nifed adjusted from 90 daily to 60 BID.  - If second agent needed, would do Lisinopril 10 mg  T2DM - Currently on Metformin. Pt was on long-acting prior to pregnancy. Hopes to eventually be placed on a pump.  - Will continue to check CBGs but will consider adding in long acting insulin given her history.  Neonatal - Doing well per pt in NICU. Baby girl.    LOS: 13 days   Radene Gunning 03/31/2022, 2:42 PM

## 2022-04-01 ENCOUNTER — Other Ambulatory Visit (HOSPITAL_COMMUNITY): Payer: Self-pay

## 2022-04-01 LAB — GLUCOSE, CAPILLARY
Glucose-Capillary: 156 mg/dL — ABNORMAL HIGH (ref 70–99)
Glucose-Capillary: 164 mg/dL — ABNORMAL HIGH (ref 70–99)
Glucose-Capillary: 112 mg/dL — ABNORMAL HIGH (ref 70–99)
Glucose-Capillary: 146 mg/dL — ABNORMAL HIGH (ref 70–99)

## 2022-04-01 LAB — SURGICAL PATHOLOGY

## 2022-04-01 MED ORDER — LISINOPRIL 10 MG PO TABS
10.0000 mg | ORAL_TABLET | Freq: Every day | ORAL | Status: DC
  Administered 2022-04-01 – 2022-04-02 (×2): 10 mg via ORAL
  Filled 2022-04-01 (×2): qty 1

## 2022-04-01 MED ORDER — NIFEDIPINE ER 60 MG PO TB24
60.0000 mg | ORAL_TABLET | Freq: Two times a day (BID) | ORAL | 0 refills | Status: DC
  Filled 2022-04-01: qty 60, 30d supply, fill #0

## 2022-04-01 MED ORDER — LABETALOL HCL 200 MG PO TABS
200.0000 mg | ORAL_TABLET | Freq: Two times a day (BID) | ORAL | Status: DC
  Administered 2022-04-01: 200 mg via ORAL
  Filled 2022-04-01 (×2): qty 1

## 2022-04-01 MED ORDER — CYCLOBENZAPRINE HCL 10 MG PO TABS
10.0000 mg | ORAL_TABLET | Freq: Three times a day (TID) | ORAL | 0 refills | Status: DC | PRN
  Filled 2022-04-01: qty 30, 10d supply, fill #0

## 2022-04-01 MED ORDER — METFORMIN HCL 1000 MG PO TABS
1000.0000 mg | ORAL_TABLET | Freq: Two times a day (BID) | ORAL | 0 refills | Status: DC
  Filled 2022-04-01: qty 60, 30d supply, fill #0

## 2022-04-01 MED ORDER — IBUPROFEN 600 MG PO TABS
600.0000 mg | ORAL_TABLET | Freq: Three times a day (TID) | ORAL | 0 refills | Status: DC | PRN
  Filled 2022-04-01: qty 30, 10d supply, fill #0

## 2022-04-01 MED ORDER — OXYCODONE HCL 5 MG PO TABS
5.0000 mg | ORAL_TABLET | ORAL | 0 refills | Status: DC | PRN
  Filled 2022-04-01: qty 30, 3d supply, fill #0

## 2022-04-01 MED ORDER — FUROSEMIDE 40 MG PO TABS
40.0000 mg | ORAL_TABLET | Freq: Two times a day (BID) | ORAL | 0 refills | Status: DC
  Filled 2022-04-01: qty 14, 7d supply, fill #0

## 2022-04-01 MED ORDER — LEVONORGESTREL 20 MCG/DAY IU IUD
1.0000 | INTRAUTERINE_SYSTEM | Freq: Once | INTRAUTERINE | 0 refills | Status: AC
  Filled 2022-04-01: qty 1, 1d supply, fill #0

## 2022-04-01 MED ORDER — LISINOPRIL 10 MG PO TABS
10.0000 mg | ORAL_TABLET | Freq: Every day | ORAL | 0 refills | Status: DC
  Filled 2022-04-01: qty 30, 30d supply, fill #0

## 2022-04-01 MED ORDER — INSULIN GLARGINE-YFGN 100 UNIT/ML ~~LOC~~ SOLN
10.0000 [IU] | Freq: Every day | SUBCUTANEOUS | Status: DC
  Administered 2022-04-01 – 2022-04-02 (×2): 10 [IU] via SUBCUTANEOUS
  Filled 2022-04-01 (×2): qty 0.1

## 2022-04-01 MED ORDER — INSULIN GLARGINE 100 UNITS/ML SOLOSTAR PEN
10.0000 [IU] | PEN_INJECTOR | Freq: Every day | SUBCUTANEOUS | 0 refills | Status: DC
  Filled 2022-04-01: qty 3, 30d supply, fill #0

## 2022-04-01 NOTE — Progress Notes (Signed)
Physical Therapy Treatment Patient Details Name: Jane Schultz MRN: [id number] DOB: Mar 20, 1981 Today's Date: 04/01/2022   History of Present Illness Pt is 41 year old presented to [facility] on  03/21/22 with worsening HTN and swelling at 63w3dpregnant. Pt reported rt hip stiffness and pain to MD. Pt underwent C-section on 03/30/22.  PMH - Deaf, HTN, asthma, DM    PT Comments    Pt now s/p c-section. Pt with edematous LE's that makes mobility more difficult. Reassured patient that as fluid in LE's came off mobility would be easier. Encouraged pt to amb multiple times a day. Pt plans to room in with baby in NICU after DC. She can use the walker from here while she is staying with her daughter. She has a rolling walker at home if she still needs it when she returns there.    Recommendations for follow up therapy are one component of a multi-disciplinary discharge planning process, led by the attending physician.  Recommendations may be updated based on patient status, additional functional criteria and insurance authorization.  Follow Up Recommendations  No PT follow up     Assistance Recommended at Discharge PRN  Patient can return home with the following     Equipment Recommendations  None recommended by PT (Pt has RW at home. Can continue to use our walker while here with baby)    Recommendations for Other Services       Precautions / Restrictions Precautions Precautions: None     Mobility  Bed Mobility Overal bed mobility: Modified Independent             General bed mobility comments: Incr time    Transfers Overall transfer level: Needs assistance Equipment used: Rolling walker (2 wheels) Transfers: Sit to/from Stand Sit to Stand: Supervision           General transfer comment: Incr time and effort    Ambulation/Gait Ambulation/Gait assistance: Supervision Gait Distance (Feet): 100 Feet Assistive device: Rolling walker (2 wheels) Gait  Pattern/deviations: Step-through pattern, Decreased stride length, Decreased dorsiflexion - right, Decreased dorsiflexion - left Gait velocity: decr Gait velocity interpretation: <1.31 ft/sec, indicative of household ambulator   General Gait Details: Decr foot clearance bilaterally due to edematous LE's   Stairs             Wheelchair Mobility    Modified Rankin (Stroke Patients Only)       Balance Overall balance assessment: No apparent balance deficits (not formally assessed)                                          Cognition Arousal/Alertness: Awake/alert Behavior During Therapy: WFL for tasks assessed/performed Overall Cognitive Status: Within Functional Limits for tasks assessed                                          Exercises General Exercises - Lower Extremity Ankle Circles/Pumps: AROM, Both, 5 reps, Seated Long Arc Quad: AROM, Both, 5 reps, Seated    General Comments        Pertinent Vitals/Pain Pain Assessment Pain Assessment: Faces Faces Pain Scale: Hurts little more Pain Location: rt hip, intermittent rt rib/chest/rt trap pain Pain Descriptors / Indicators: Sore, Sharp Pain Intervention(s): Monitored during session    Home Living  Prior Function            PT Goals (current goals can now be found in the care plan section) Acute Rehab PT Goals Patient Stated Goal: not stated Progress towards PT goals: Progressing toward goals    Frequency    Min 3X/week      PT Plan Current plan remains appropriate;Frequency needs to be updated    Co-evaluation              AM-PAC PT "6 Clicks" Mobility   Outcome Measure  Help needed turning from your back to your side while in a flat bed without using bedrails?: None Help needed moving from lying on your back to sitting on the side of a flat bed without using bedrails?: None Help needed moving to and from a bed to a  chair (including a wheelchair)?: A Little Help needed standing up from a chair using your arms (e.g., wheelchair or bedside chair)?: A Little Help needed to walk in hospital room?: A Little Help needed climbing 3-5 steps with a railing? : A Little 6 Click Score: 20    End of Session   Activity Tolerance: Patient tolerated treatment well Patient left: in bed;with call bell/phone within reach Nurse Communication: Mobility status PT Visit Diagnosis: Pain;Other abnormalities of gait and mobility (R26.89) Pain - Right/Left: Right Pain - part of body: Hip     Time: 6153-7943 PT Time Calculation (min) (ACUTE ONLY): 15 min  Charges:                        [place] Office [place] 04/01/2022, 4:59 PM

## 2022-04-01 NOTE — Progress Notes (Signed)
Patient called out complaining of gas pain but refused simethicone. This RN educated patient on the importance of ambulation after cesarean section and attempted to ambulate patient. Patient refused to ambulate.

## 2022-04-01 NOTE — TOC Transition Note (Signed)
Discharge medications (7) are being stored in the main pharmacy on the ground floor until patient is ready for discharge.   

## 2022-04-01 NOTE — Lactation Note (Signed)
This note was copied from a baby's chart.  NICU Lactation Consultation Note  Patient Name: Jane Schultz Today's Date: 04/01/2022 Age:40 hours  Subjective Reason for consult: Initial assessment; Primapara; 1st time breastfeeding; NICU baby; Preterm <34wks; Infant < 6lbs; Maternal endocrine disorder  Interpretor: Sign Language.  Jane Schultz was having breakfast upon entry and speaking with the NICU RN. I introduced myself and confirmed that her feeding plan is to breastfeed/provide breast milk. She states that she "does not want my baby to have formula."  Jane Schultz states that she has signed donor milk consent.   She has a DEBP set up and has been shown the basics by her RN. She has used her electric and manual pump several times in the last 24 hours. She asked that I return at 1530 today to give her a more detailed review of the pump.  I offered to place a referral to [facility] to obtain a pump for at-home use.  Objective Infant data: Mother's Current Feeding Choice: Breast Milk and Donor Milk  Maternal data: [id number]  C-Section, Low Transverse Current breast feeding challenges:: NICU; separation  Does the patient have breastfeeding experience prior to this delivery?: No   WIC Program: Yes WIC Referral Sent?: Yes Pump:  (does not have one at this time; refer to [facility])  Intervention/Plan Interventions: Breast feeding basics reviewed; Education; DEBP  Tools: Other (comment) (interpretor)  Plan: Consult Status: NICU follow-up  NICU Follow-up type: New admission follow up    Lenore Manner 04/01/2022, 9:56 AM

## 2022-04-01 NOTE — Progress Notes (Signed)
CSW met with MOB at bedside to complete psychosocial assessment, MOB was accompanied by female guest. CSW utilized ASL interpreter ([name]). CSW asked to speak with MOB privately to complete assessment, MOB declined to complete assessment. MOB reported that she didn't need a Education officer, museum. CSW respected MOB's wishes and asked if MOB had any needs/concerns. MOB reported that she needed the baby monitor. CSW explained that CSW awaiting return call from Deaf and Hard of Hearing Division of Services in regards to requested equipment. MOB's female guest asked about housing options for MOB to be close to infant while infant is in the NICU. CSW explained that MOB is able to room in with infant. CSW inquired about any additional needs/concerns, MOB asked for CSW contact information. CSW provided MOB with CSW contact information and explained that MOB could also text CSW. MOB denied any additional questions/needs.   MOB declined assessment. CSW will assist with support as needed.   Abundio Miu, [place] Worker [facility] Cell#: [phone]

## 2022-04-01 NOTE — Lactation Note (Signed)
This note was copied from a baby's chart.  NICU Lactation Consultation Note  Patient Name: Jane Schultz Today's Date: 04/01/2022 Age:40 hours   Subjective Reason for consult: Follow-up assessment; Mother's request  Lactation followed up with Jane Schultz and assisted with pumping. I reviewed set up of DEBP and recommendations for pumping frequency. I notified patient that I placed a [facility] referral for a breast pump for at-home use.  Objective Infant data: Mother's Current Feeding Choice: Breast Milk and Donor Milk  Infant feeding assessment Scale for Readiness: 3  Maternal data: [id number]   Current breast feeding challenges:: NICU; separation  Does the patient have breastfeeding experience prior to this delivery?: No  Pumping frequency: recommended q3 hours Pumped volume: 0 mL Flange Size: 24  Risk factor for low milk supply:: -- (inconsistent pumping in first 48 hours)  WIC Program: Yes WIC Referral Sent?: Yes Pump:  (does not have one at this time; refer to [facility])  Assessment Maternal: Milk volume: Normal   Intervention/Plan Interventions: Breast feeding basics reviewed; DEBP; Education  Tools: Pump; Flanges Pump Education: Setup, frequency, and cleaning  Plan: Consult Status: NICU follow-up  NICU Follow-up type: New admission follow up    Lenore Manner 04/01/2022, 4:20 PM

## 2022-04-01 NOTE — Progress Notes (Signed)
POD1 s/p RLTCS, ppIUD insertion due to worsening SIPE  Subjective: Notes pain in her chest overnight and her right lower ribs. It is in her neck and sometimes radiates from the right side of her neck along her right chest wall. It feels sharp. It comes and goes. She denies cough, tachycardia, SOB.   Objective: Blood pressure 139/62, pulse 76, temperature 98.6 F (37 C), temperature source Oral, resp. rate 17, height '5\' 7"'$  (1.702 m), weight 122 kg, last menstrual period 09/02/2021, SpO2 99 %, unknown if currently breastfeeding.  Physical Exam:  General: alert, cooperative, and no distress Lochia: appropriate Uterine Fundus: firm Incision: healing well, no significant drainage, no dehiscence, no significant erythema DVT Evaluation: Calf/Ankle edema is present.  Recent Labs    03/31/22 0459 03/31/22 1559  HGB 8.8* 7.8*  HCT 25.2* 23.5*     Assessment/Plan: Postpartum - Contraception: ppIUD (Mirena) - MOF: Breast - Rh status: Positive - Rubella status: Immune - Dispo: Most likely on POD3 - Consults: Lactation, DM education  Right chest pain/rib pain - Suspect this is also MSK in origin from her position in the bed (she is usually sitting diagonally in it) - Nevertheless will check EKG to rule out cardiac origin. She otherwise has no associated symptoms and appears well in bed.  - PT at bedside to work with her  Right hip pain - Discussed would give it 2-3 weeks for swelling to improve, impact from surgery/pregnancy to alleviate and for her to no longer be in hospital bed. If still present would advise she follow up with PCP. She also notes she would like new PCP. She lives in [place]. Amb ref sent to [place].   Preeclampsia - s/p Mag - Nifed 60 BID and Lisinopril 10 daily - Lasix 40 - Meds to beds done  T2DM - Currently on Metformin. Pt was on long-acting prior to pregnancy. Hopes to eventually be placed on a pump.  - Reduced Insulin to 10 units long  acting with plans to have her follow up with PCP as an outpatient.   Neonatal - Doing well per pt in NICU. Baby girl.   7. Deaf - In-person interpreter used throughout   LOS: 14 days   Radene Gunning 04/01/2022, 4:33 PM

## 2022-04-02 LAB — GLUCOSE, CAPILLARY
Glucose-Capillary: 131 mg/dL — ABNORMAL HIGH (ref 70–99)
Glucose-Capillary: 160 mg/dL — ABNORMAL HIGH (ref 70–99)
Glucose-Capillary: 95 mg/dL (ref 70–99)

## 2022-04-02 MED ORDER — NIFEDIPINE 10 MG PO CAPS
10.0000 mg | ORAL_CAPSULE | Freq: Once | ORAL | Status: AC
  Administered 2022-04-02: 10 mg via ORAL
  Filled 2022-04-02: qty 1

## 2022-04-02 NOTE — Plan of Care (Signed)
  Problem: Health Behavior/Discharge Planning: Goal: Ability to manage health-related needs will improve Outcome: Completed/Met   Problem: Clinical Measurements: Goal: Ability to maintain clinical measurements within normal limits will improve Outcome: Completed/Met Goal: Will remain free from infection Outcome: Completed/Met Goal: Diagnostic test results will improve Outcome: Completed/Met Goal: Respiratory complications will improve Outcome: Completed/Met Goal: Cardiovascular complication will be avoided Outcome: Completed/Met   Problem: Activity: Goal: Risk for activity intolerance will decrease Outcome: Completed/Met   Problem: Elimination: Goal: Will not experience complications related to bowel motility Outcome: Completed/Met   Problem: Pain Managment: Goal: General experience of comfort will improve Outcome: Completed/Met   Problem: Safety: Goal: Ability to remain free from injury will improve Outcome: Completed/Met   Problem: Skin Integrity: Goal: Risk for impaired skin integrity will decrease Outcome: Completed/Met   Problem: Fluid Volume: Goal: Peripheral tissue perfusion will improve Outcome: Completed/Met   Problem: Clinical Measurements: Goal: Complications related to disease process, condition or treatment will be avoided or minimized Outcome: Completed/Met   Problem: Education: Goal: Knowledge of disease or condition will improve Outcome: Completed/Met Goal: Knowledge of the prescribed therapeutic regimen will improve Outcome: Completed/Met Goal: Individualized Educational Video(s) Outcome: Completed/Met   Problem: Clinical Measurements: Goal: Complications related to the disease process, condition or treatment will be avoided or minimized Outcome: Completed/Met   Problem: Education: Goal: Ability to describe self-care measures that may prevent or decrease complications (Diabetes Survival Skills Education) will improve Outcome:  Completed/Met Goal: Individualized Educational Video(s) Outcome: Completed/Met   Problem: Fluid Volume: Goal: Ability to maintain a balanced intake and output will improve Outcome: Completed/Met   Problem: Health Behavior/Discharge Planning: Goal: Ability to identify and utilize available resources and services will improve Outcome: Completed/Met Goal: Ability to manage health-related needs will improve Outcome: Completed/Met   Problem: Nutritional: Goal: Maintenance of adequate nutrition will improve Outcome: Completed/Met Goal: Progress toward achieving an optimal weight will improve Outcome: Completed/Met   Problem: Skin Integrity: Goal: Risk for impaired skin integrity will decrease Outcome: Completed/Met   Problem: Tissue Perfusion: Goal: Adequacy of tissue perfusion will improve Outcome: Completed/Met   Problem: Education: Goal: Knowledge of the prescribed therapeutic regimen will improve Outcome: Completed/Met Goal: Understanding of sexual limitations or changes related to disease process or condition will improve Outcome: Completed/Met Goal: Individualized Educational Video(s) Outcome: Completed/Met   Problem: Self-Concept: Goal: Communication of feelings regarding changes in body function or appearance will improve Outcome: Completed/Met   Problem: Skin Integrity: Goal: Demonstration of wound healing without infection will improve Outcome: Completed/Met   Problem: Education: Goal: Knowledge of condition will improve Outcome: Completed/Met Goal: Individualized Educational Video(s) Outcome: Completed/Met Goal: Individualized Newborn Educational Video(s) Outcome: Completed/Met   Problem: Activity: Goal: Will verbalize the importance of balancing activity with adequate rest periods Outcome: Completed/Met Goal: Ability to tolerate increased activity will improve Outcome: Completed/Met   Problem: Coping: Goal: Ability to identify and utilize available  resources and services will improve Outcome: Completed/Met   Problem: Life Cycle: Goal: Chance of risk for complications during the postpartum period will decrease Outcome: Completed/Met   Problem: Role Relationship: Goal: Ability to demonstrate positive interaction with newborn will improve Outcome: Completed/Met   Problem: Skin Integrity: Goal: Demonstration of wound healing without infection will improve Outcome: Completed/Met

## 2022-04-02 NOTE — Progress Notes (Signed)
Physical Therapy Treatment Patient Details Name: Jane Schultz MRN: [id number] DOB: January 23, 1981 Today's Date: 04/02/2022   History of Present Illness Pt is 41 year old presented to [facility] on  03/21/22 with worsening HTN and swelling at 41w3dpregnant. Pt reported rt hip stiffness and pain to MD. Pt underwent C-section on 03/30/22.  PMH - Deaf, HTN, asthma, DM    PT Comments    Patient progressing slowly towards PT goals. Reports feeling dizzy this morning as she was just given pain meds prior to arrival. Tolerated ambulation with use of RW and supervision for safety. 1 standing rest break needed. VSS on RA. Continues to have difficulty with DF of BLEs during gait as well as wide BoS due to edema. Instructed pt in 2 new exercises to perform as well as reviewed exercises from yesterday to help with edema management and strengthening. Encouraged hallway ambulation 2-3 more times today. Will continue to follow.    Recommendations for follow up therapy are one component of a multi-disciplinary discharge planning process, led by the attending physician.  Recommendations may be updated based on patient status, additional functional criteria and insurance authorization.  Follow Up Recommendations  No PT follow up     Assistance Recommended at Discharge PRN  Patient can return home with the following     Equipment Recommendations  Other (comment) (has RW at home, can continue to use our RW here with the baby)    Recommendations for Other Services       Precautions / Restrictions Precautions Precautions: None Restrictions Weight Bearing Restrictions: No     Mobility  Bed Mobility Overal bed mobility: Needs Assistance Bed Mobility: Supine to Sit     Supine to sit: Supervision, HOB elevated     General bed mobility comments: Incr time    Transfers Overall transfer level: Needs assistance Equipment used: Rolling walker (2 wheels) Transfers: Sit to/from Stand Sit to  Stand: Supervision           General transfer comment: Supervision for safety. Stood from [employer]    Ambulation/Gait Ambulation/Gait assistance: Supervision Gait Distance (Feet): 100 Feet Assistive device: Rolling walker (2 wheels) Gait Pattern/deviations: Step-through pattern, Decreased stride length, Decreased dorsiflexion - right, Decreased dorsiflexion - left, Wide base of support Gait velocity: decr Gait velocity interpretation: <1.31 ft/sec, indicative of household ambulator   General Gait Details: Decr foot clearance bilaterally due to edematous LE's and wide BoS.VSS with HR up to 123 bpm and Sp02 97%.1  standing rest break. Dizziness reported due to having received pain meds prior to arrival.   Stairs             Wheelchair Mobility    Modified Rankin (Stroke Patients Only)       Balance Overall balance assessment: No apparent balance deficits (not formally assessed)                                          Cognition Arousal/Alertness: Awake/alert Behavior During Therapy: WFL for tasks assessed/performed Overall Cognitive Status: Within Functional Limits for tasks assessed                                          Exercises Other Exercises Other Exercises: Heels raises and toe raises sitting EOB x10,    General  Comments General comments (skin integrity, edema, etc.): Continues to have edema in bil feet and LEs, reports similar to yesterday      Pertinent Vitals/Pain Pain Assessment Pain Assessment: Faces Faces Pain Scale: Hurts little more Pain Location: right shoulder Pain Descriptors / Indicators: Sore, Grimacing Pain Intervention(s): Monitored during session, Premedicated before session, Repositioned    Home Living                          Prior Function            PT Goals (current goals can now be found in the care plan section) Progress towards PT goals: Progressing toward goals     Frequency    Min 3X/week      PT Plan Current plan remains appropriate    Co-evaluation              AM-PAC PT "6 Clicks" Mobility   Outcome Measure  Help needed turning from your back to your side while in a flat bed without using bedrails?: None Help needed moving from lying on your back to sitting on the side of a flat bed without using bedrails?: None Help needed moving to and from a bed to a chair (including a wheelchair)?: A Little Help needed standing up from a chair using your arms (e.g., wheelchair or bedside chair)?: A Little Help needed to walk in hospital room?: A Little Help needed climbing 3-5 steps with a railing? : A Little 6 Click Score: 20    End of Session   Activity Tolerance: Patient tolerated treatment well Patient left: in bed;with call bell/phone within reach (sitting EOB) Nurse Communication: Mobility status PT Visit Diagnosis: Pain;Other abnormalities of gait and mobility (R26.89) Pain - Right/Left: Right Pain - part of body: Shoulder     Time: 0950-1005 PT Time Calculation (min) (ACUTE ONLY): 15 min  Charges:  $Gait Training: 8-22 mins                     Marisa Severin, PT, DPT Acute Rehabilitation Services Secure chat preferred Office [place] 04/02/2022, 12:08 PM

## 2022-04-02 NOTE — Lactation Note (Signed)
This note was copied from a baby's chart.  NICU Lactation Consultation Note  Patient Name: Jane Schultz Today's Date: 04/02/2022 Age:41 years  Subjective Reason for consult: Follow-up assessment; NICU baby; Preterm <34wks; Infant < 6lbs  Lactation followed up via sign language interpretor. [name] has been discharged and is now planning to room in with infant. I made sure she had all needed pumping supplies and cleaned her pump equipment. I provided additional storage bottles.  I recommended pumping q3 hours and storing milk in the refrigerator. I asked that her RN provide breast milk storage labels.  I showed parent the [employer] where there are refreshments, and I called the Education officer, museum to come and share additional resources.  Objective Infant data: Mother's Current Feeding Choice: Breast Milk and Donor Milk  Infant feeding assessment Scale for Readiness: 5  Maternal data: [id number]  C-Section, Low Transverse Current breast feeding challenges:: NICU; language barrier  Does the patient have breastfeeding experience prior to this delivery?: No  Pumping frequency: recommend q3 hours Pumped volume: 10 mL Flange Size: 24  Risk factor for low milk supply:: -- (inconsistent pumping in first 48 hours)   WIC Program: Yes WIC Referral Sent?: Yes Pump: DEBP  Assessment Maternal: Milk volume: Normal   Intervention/Plan Interventions: Breast feeding basics reviewed; Education  Tools: Pump Pump Education: Setup, frequency, and cleaning  Plan: Consult Status: NICU follow-up  NICU Follow-up type: New admission follow up; Verify absence of engorgement; Verify onset of copious milk    Lenore Manner 04/02/2022, 5:25 PM

## 2022-04-03 ENCOUNTER — Other Ambulatory Visit: Payer: Self-pay | Admitting: Obstetrics and Gynecology

## 2022-04-03 DIAGNOSIS — E119 Type 2 diabetes mellitus without complications: Secondary | ICD-10-CM

## 2022-04-03 MED ORDER — BD PEN NEEDLE NANO 2ND GEN 32G X 4 MM MISC
10.0000 [IU] | Freq: Every day | 1 refills | Status: DC
  Filled 2022-04-03: qty 30, fill #0
  Filled 2022-04-05: qty 30, 30d supply, fill #0

## 2022-04-03 NOTE — Progress Notes (Signed)
NICU called - pt doesn't have pen needles for her lantus. Will send to [facility] and they informed NICU they can fill it for her tomorrow. Ordered submitted today.   Radene Gunning, MD Attending [place], [facility] for [facility], [place]

## 2022-04-04 ENCOUNTER — Telehealth (HOSPITAL_COMMUNITY): Payer: Self-pay | Admitting: *Deleted

## 2022-04-04 ENCOUNTER — Ambulatory Visit: Payer: Medicaid Other | Admitting: Cardiology

## 2022-04-04 ENCOUNTER — Ambulatory Visit: Payer: Self-pay

## 2022-04-04 ENCOUNTER — Other Ambulatory Visit (HOSPITAL_COMMUNITY): Payer: Self-pay

## 2022-04-04 DIAGNOSIS — Z1331 Encounter for screening for depression: Secondary | ICD-10-CM

## 2022-04-04 NOTE — Lactation Note (Signed)
This note was copied from a baby's chart.  NICU Lactation Consultation Note  Patient Name: Jane Schultz Today's Date: 04/04/2022 Age:41 years  Subjective Reason for consult: Follow-up assessment; NICU baby; Preterm <34wks  Lactation followed up with [facility] with assistance of interpretor.  [name] states that she is pumping q2-3 hours with the exception of nighttime, because it's difficult to wake up. I provided education on how pumping frequency, including at night, will modulate and increase her milk volume.  [name] is now pumping 20+ mls session. I showed her how to use the expression phase on her pump. She is rooming in at this time.  I provided additional storage bottles and asked her RN for additional storage labels (she has 3 left).  I also confirmed that she has an appointment at 1100 on Tuesday 12/19 (phone appt) with [facility]. We verified the relay number she should use, and I wrote the information on her white board.   Objective Infant data: Mother's Current Feeding Choice: Breast Milk  Maternal data: [id number]  C-Section, Low Transverse Current breast feeding challenges:: NICU  Pumping frequency: recommended q3 hours including at night Pumped volume: 25 mL  Risk factor for low milk supply:: not pumping at night   WIC Program: Yes WIC Referral Sent?: Yes Pump:  (WIC to follow up on 12/19 at 1100)  Assessment  Maternal: No data recorded  Intervention/Plan Interventions: Breast feeding basics reviewed; Education  Tools: Pump Pump Education: Setup, frequency, and cleaning  Plan: Consult Status: NICU follow-up  NICU Follow-up type: New admission follow up    Lenore Manner 04/04/2022, 40:08 AM

## 2022-04-04 NOTE — Telephone Encounter (Signed)
Inpatient EPDS of 13. CSW consult completed during hospital stay. Ambulatory IBH referral made now. Dr. Dione Plover notified via chart.  Odis Hollingshead, RN 04-04-2022 at 11:38am

## 2022-04-05 ENCOUNTER — Inpatient Hospital Stay (HOSPITAL_COMMUNITY)
Admission: AD | Admit: 2022-04-05 | Discharge: 2022-04-05 | Disposition: A | Discharge status: Home / Self Care | Payer: Medicaid Other | Attending: Obstetrics and Gynecology | Admitting: Obstetrics and Gynecology

## 2022-04-05 ENCOUNTER — Other Ambulatory Visit (HOSPITAL_COMMUNITY): Payer: Self-pay

## 2022-04-05 DIAGNOSIS — O909 Complication of the puerperium, unspecified: Secondary | ICD-10-CM | POA: Insufficient documentation

## 2022-04-05 DIAGNOSIS — O8600 Infection of obstetric surgical wound, unspecified: Secondary | ICD-10-CM | POA: Diagnosis not present

## 2022-04-05 DIAGNOSIS — Z98891 History of uterine scar from previous surgery: Secondary | ICD-10-CM

## 2022-04-05 DIAGNOSIS — T8189XA Other complications of procedures, not elsewhere classified, initial encounter: Secondary | ICD-10-CM | POA: Diagnosis not present

## 2022-04-05 MED ORDER — ACCU-CHEK GUIDE W/DEVICE KIT
1.0000 | PACK | Freq: Four times a day (QID) | 0 refills | Status: AC
  Filled 2022-04-05: qty 1, 30d supply, fill #0

## 2022-04-05 MED ORDER — ACCU-CHEK SOFTCLIX LANCETS MISC
0 refills | Status: AC
  Filled 2022-04-05: qty 100, 25d supply, fill #0

## 2022-04-05 MED ORDER — ACCU-CHEK GUIDE VI STRP
ORAL_STRIP | 12 refills | Status: DC
  Filled 2022-04-05: qty 200, 30d supply, fill #0

## 2022-04-05 NOTE — MAU Note (Addendum)
...  Jane Schultz is a 41 y.o. at Unknown here in MAU reporting: She would like to have her incision checked. She reports she noticed after looking at the bandage around 1700 today she noticed blood on her honeycomb dressing. She reports she is not experiencing any pain at her incision. She reports she feels she is at fault as she was moving a chair looking for her diabetes supplies. She reports she has a current HA and rates this a 4/10. She reports her HA began while waiting in the lobby. She is also endorsing occasional dizziness. She reports she is still experiencing bilateral swelling of her legs and feet. She reports she is taking Lasix BID as well as 60 mg of nifedipine BID but reports her swelling has not decreased.  Patient reports she does not want anything for her headache. CNM notified.  Next office appointment is scheduled for tomorrow but she reports she is unable to go. She reports she did not notify transportation services two days in advance as she has been so stressed with her baby being in the NICU.  Onset of complaint: Today at 1700 Pain score: Denies pain.

## 2022-04-05 NOTE — Addendum Note (Signed)
Addended by: Gale Journey on: 04/05/2022 02:00 PM   Modules accepted: Orders

## 2022-04-05 NOTE — MAU Provider Note (Signed)
Event Date/Time   First Provider Initiated Contact with Patient 04/05/22 1848     S [name] is a 41 y.o. [id number] female on PPD#6 who presents to MAU today with complaint of bleeding surgical incision from her Cesarean delivery. Has not noticed any incisional bleeding until this afternoon when she moved a couch and then noticed bleeding on her honeycomb dressing. Had an appointment to have it removed tomorrow but will not be able to go since she did not notify transportation in time.   Complains of a headache (4/10), declines medication - thinks it is due to not eating since 11am. Of note - her daughter was born at 59w5ddue to chronic hypertension with superimposed severe preeclampsia. She is taking procardia BID, has not taken her evening dose yet.   ASL interpreter used throughout visit via [employer] video.  [facility]ce[facility] care at [facility], [facility].  Pertinent items noted in HPI and remainder of comprehensive ROS otherwise negative.   O BP 139/69 (BP Location: Right Arm)   Pulse 83   Temp 99 F (37.2 C) (Oral)   Resp 15   SpO2 100%   Breastfeeding Yes  Physical Exam Vitals and nursing note reviewed. Exam conducted with a chaperone present.  Constitutional:      General: She is not in acute distress.    Appearance: Normal appearance. She is not ill-appearing.  Eyes:     Pupils: Pupils are equal, round, and reactive to light.  Cardiovascular:     Rate and Rhythm: Normal rate and regular rhythm.  Pulmonary:     Effort: Pulmonary effort is normal.  Abdominal:     Palpations: Abdomen is soft.  Musculoskeletal:        General: Normal range of motion.  Skin:    General: Skin is warm and dry.     Capillary Refill: Capillary refill takes less than 2 seconds.       Neurological:     Mental Status: She is alert and oriented to person, place, and time.  Psychiatric:        Mood and Affect: Mood normal.         Behavior: Behavior normal.        Thought Content: Thought content normal.        Judgment: Judgment normal.      MDM: Low  MAU Course: Dressing removed, incision looks to be healing well. Copious reassurance given. ABD pad applied loosely to protect pt's underwear, advised to keep it covered while draining, should only be the next 24hrs or so. Advised to take it easier and avoid heavy lifting until incision is fully healed/not draining.  A Problem involving surgical incision - Plan: Discharge patient  Status post cesarean delivery - Plan: Discharge patient   P Discharge from MAU in stable condition with return precautions Pt taken back to NICU by tech (has mobility issues and uses a Augusto Deckman which she left in NICU) Note routed to [facility] re difficulty getting to her appt and incisional site checked in MAU  Allergies as of 04/05/2022   No Known Allergies      Medication List     TAKE these medications    Accu-Chek Guide test strip Generic drug: glucose blood Use to check blood sugars as instructed   Accu-Chek Guide w/Device Kit Use as directed.   Accu-Chek Softclix Lancets lancets Use As Directed   Basaglar KwikPen 100 UNIT/ML Inject 10 Units into the skin daily.  cyclobenzaprine 10 MG tablet Commonly known as: FLEXERIL Take 1 tablet (10 mg total) by mouth 3 (three) times daily as needed for muscle spasms.   Dexcom G6 Receiver Devi Check blood sugars 4 x daily (fasting and 2 hours postprandial)   Dexcom G6 Sensor Misc Check blood sugars 4 x daily (fasting and 2 hours postprandial)   Dexcom G6 Transmitter Misc Check blood sugars 4 x daily (fasting and 2 hours postprandial)   furosemide 40 MG tablet Commonly known as: LASIX Take 1 tablet (40 mg total) by mouth 2 (two) times daily for 7 days.   ibuprofen 600 MG tablet Commonly known as: ADVIL Take 1 tablet (600 mg total) by mouth every 8 (eight) hours as needed for moderate pain or cramping.    levonorgestrel 20 MCG/DAY Iud Commonly known as: MIRENA 1 each by Intrauterine route once for 1 dose.   lisinopril 10 MG tablet Commonly known as: ZESTRIL Take 1 tablet (10 mg total) by mouth daily.   metFORMIN 1000 MG tablet Commonly known as: GLUCOPHAGE Take 1 tablet (1,000 mg total) by mouth 2 (two) times daily with a meal.   NIFEdipine 60 MG 24 hr tablet Commonly known as: ADALAT CC Take 1 tablet (60 mg total) by mouth 2 (two) times daily.   oxyCODONE 5 MG immediate release tablet Commonly known as: Oxy IR/ROXICODONE Take 1-2 tablets (5-10 mg total) by mouth every 4 (four) hours as needed for moderate pain.   Prenatal Adult Gummy/DHA/FA 0.4-25 MG Chew Chew 1 each by mouth daily.   TechLite Pen Needles 32G X 4 MM Misc Generic drug: Insulin Pen Needle Use to inject insulin.       Gabriel Carina, [place] 04/05/2022 7:21 PM

## 2022-04-05 NOTE — Progress Notes (Signed)
Received call from pharmacy on 12/19 that patient needs new glucometer. Accu-chek guide is approved by insurance per pharmacy. Sent to [facility] pharmacy with test strips per patient preference.   Gale Journey, MD [place], [facility] for [employer], [place]

## 2022-04-06 ENCOUNTER — Ambulatory Visit: Payer: Medicaid Other | Admitting: Obstetrics and Gynecology

## 2022-04-06 ENCOUNTER — Ambulatory Visit (INDEPENDENT_AMBULATORY_CARE_PROVIDER_SITE_OTHER): Payer: Medicaid Other | Admitting: Obstetrics and Gynecology

## 2022-04-06 ENCOUNTER — Encounter: Payer: Self-pay | Admitting: Obstetrics and Gynecology

## 2022-04-06 ENCOUNTER — Other Ambulatory Visit (HOSPITAL_COMMUNITY): Payer: Self-pay

## 2022-04-06 ENCOUNTER — Other Ambulatory Visit: Payer: Self-pay

## 2022-04-06 VITALS — BP 190/85 | HR 67 | Resp 16 | Ht 67.0 in | Wt 253.6 lb

## 2022-04-06 DIAGNOSIS — O8600 Infection of obstetric surgical wound, unspecified: Secondary | ICD-10-CM | POA: Diagnosis not present

## 2022-04-06 DIAGNOSIS — I10 Essential (primary) hypertension: Secondary | ICD-10-CM

## 2022-04-06 DIAGNOSIS — Z98891 History of uterine scar from previous surgery: Secondary | ICD-10-CM

## 2022-04-06 DIAGNOSIS — E119 Type 2 diabetes mellitus without complications: Secondary | ICD-10-CM

## 2022-04-06 DIAGNOSIS — Z1332 Encounter for screening for maternal depression: Secondary | ICD-10-CM | POA: Diagnosis not present

## 2022-04-06 DIAGNOSIS — Z1331 Encounter for screening for depression: Secondary | ICD-10-CM

## 2022-04-06 MED ORDER — HYDROCHLOROTHIAZIDE 25 MG PO TABS
25.0000 mg | ORAL_TABLET | Freq: Every day | ORAL | 0 refills | Status: DC
  Filled 2022-04-06 (×2): qty 30, 30d supply, fill #0

## 2022-04-06 MED ORDER — METOPROLOL-HYDROCHLOROTHIAZIDE 50-25 MG PO TABS
1.0000 | ORAL_TABLET | Freq: Every day | ORAL | 0 refills | Status: DC
  Filled 2022-04-06 (×2): qty 30, 30d supply, fill #0

## 2022-04-06 MED ORDER — METOPROLOL TARTRATE 50 MG PO TABS
50.0000 mg | ORAL_TABLET | Freq: Every day | ORAL | 0 refills | Status: DC
  Filled 2022-04-06 – 2022-04-08 (×4): qty 30, 30d supply, fill #0

## 2022-04-06 NOTE — Progress Notes (Signed)
OBSTETRICS/GYNECOLOGY POST-OPERATIVE CLINIC VISIT  Subjective:     Jane Schultz is a 41 y.o. female who presents to the clinic 1 weeks status post CESAREAN SECTION  for severe preeclampsia, also with pre-existing diabetes. C-section occurred at 28.[redacted] weeks gestation.  Required multiple medications, C-section performed at [facility] in [place].  Had  Mirena IUD placed immediately postpartum during C-section.  Eating a regular diet without difficulty. Bowel movements are normal. Pain is controlled with current analgesics. Medications being used: ibuprofen (OTC) and narcotic analgesics including oxycodone/acetaminophen (Percocet, Tylox). Is working to try to breastfeed. Seen by lactation specialist.   Patient reports that she was visiting her infant in the NICU 3 days after her C-section when she her incision started bleeding.  Was seen at [facility] in [place] who noted incision looked ok.  Notes that since then she still has noted daily bleeding from the incision.   The following portions of the patient's history were reviewed and updated as appropriate: allergies, current medications, past family history, past medical history, past social history, past surgical history, and problem list.   Current Outpatient Medications on File Prior to Visit  Medication Sig Dispense Refill   Accu-Chek Softclix Lancets lancets Use As Directed 100 each 0   Blood Glucose Monitoring Suppl (ACCU-CHEK GUIDE) w/Device KIT Use as directed. 1 kit 0   Continuous Blood Gluc Receiver (DEXCOM G6 RECEIVER) DEVI Check blood sugars 4 x daily (fasting and 2 hours postprandial) 1 each 0   Continuous Blood Gluc Sensor (DEXCOM G6 SENSOR) MISC Check blood sugars 4 x daily (fasting and 2 hours postprandial) 3 each 6   Continuous Blood Gluc Transmit (DEXCOM G6 TRANSMITTER) MISC Check blood sugars 4 x daily (fasting and 2 hours postprandial) 1 each 0   cyclobenzaprine (FLEXERIL) 10 MG tablet Take 1 tablet (10 mg  total) by mouth 3 (three) times daily as needed for muscle spasms. 30 tablet 0   furosemide (LASIX) 40 MG tablet Take 1 tablet (40 mg total) by mouth 2 (two) times daily for 7 days. 14 tablet 0   glucose blood (ACCU-CHEK GUIDE) test strip Use to check blood sugars as instructed 200 each 12   ibuprofen (ADVIL) 600 MG tablet Take 1 tablet (600 mg total) by mouth every 8 (eight) hours as needed for moderate pain or cramping. 30 tablet 0   insulin glargine (LANTUS) 100 unit/mL SOPN Inject 10 Units into the skin daily. 3 mL 0   Insulin Pen Needle (BD PEN NEEDLE NANO 2ND GEN) 32G X 4 MM MISC Use to inject insulin. 30 each 1   lisinopril (ZESTRIL) 10 MG tablet Take 1 tablet (10 mg total) by mouth daily. 30 tablet 0   metFORMIN (GLUCOPHAGE) 1000 MG tablet Take 1 tablet (1,000 mg total) by mouth 2 (two) times daily with a meal. 60 tablet 0   NIFEdipine (ADALAT CC) 60 MG 24 hr tablet Take 1 tablet (60 mg total) by mouth 2 (two) times daily. 60 tablet 0   oxyCODONE (OXY IR/ROXICODONE) 5 MG immediate release tablet Take 1-2 tablets (5-10 mg total) by mouth every 4 (four) hours as needed for moderate pain. 30 tablet 0   Prenatal MV & Min w/FA-DHA (PRENATAL ADULT GUMMY/DHA/FA) 0.4-25 MG CHEW Chew 1 each by mouth daily. 30 tablet 3   levonorgestrel (MIRENA) 20 MCG/DAY IUD 1 each by Intrauterine route once for 1 dose. 1 each 0   No current facility-administered medications on file prior to visit.    Review of Systems  Pertinent items noted in HPI and remainder of comprehensive ROS otherwise negative.   Objective:   BP (!) 190/85   Pulse 67   Resp 16   Ht _0  (1.702 m)   Wt 253 lb 9.6 oz (115 kg)   Breastfeeding Yes   BMI 39.72 kg/m  Body mass index is 39.72 kg/m.  General:  alert and no distress  Abdomen: soft, bowel sounds active, non-tender  Incision:   Overall healing well, no erythema, no hernia, no swelling, no dehiscence, incision well approximated except ~ 11 cm of overlapping skin edges  with moderate drainage of dark red blood on palpation of incision, possible hematoma was present.   Extremities: Significant bilateral lower extremity edema, +3 pitting up to thighs       04/06/2022    3:00 PM 03/31/2022    3:26 PM  Edinburgh Postnatal Depression Scale Screening Tool  I have been able to laugh and see the funny side of things. 1 2  I have looked forward with enjoyment to things. 1 2  I have blamed myself unnecessarily when things went wrong. 1 2  I have been anxious or worried for no good reason. 2 2  I have felt scared or panicky for no good reason. 1 0  Things have been getting on top of me. 1 1  I have been so unhappy that I have had difficulty sleeping. 2 2  I have felt sad or miserable. 0 1  I have been so unhappy that I have been crying. 1 1  The thought of harming myself has occurred to me. 0 0  Edinburgh Postnatal Depression Scale Total 10 13      Assessment:   Patient s/p CESAREAN SECTION  (surgery)  Postoperative course complicated by uncontrolled HTN and incision hematoma   Plan:   1. Will readjust BP meds, change from Lisinopril to Metoprolol (medication she was on prior to pregnancy), will also add HCTZ to increase diureses. Already on Lasix BID. Also encourage more protein intake.  2. Wound care discussed.  Wound expressed, ~ 40 cc removed.  Area cauterized with silver nitrate stick and wound covered with pressure bandage. Advised to remove in 1-2 days.  3. Screening for maternal depression borderline positive today. Patient notes stressors of premature infant limited mobility due to significant swelling, limitations in transportation, etc. Can have SW reach out.  4. Activity restrictions: no bending, stooping, or squatting, no lifting more than 10-15 pounds, and pelvic rest.  5. Anticipated return to work: not applicable. 6. Follow up: 1 week for BP check, and 4 weeks for post partum visit     Rubie Maid, MD [place]

## 2022-04-06 NOTE — Patient Instructions (Addendum)
Protein Content in Foods Protein is a necessary nutrient in any diet. It helps build and repair muscles, bones, and skin. Depending on your overall health, you may need more or less protein in your diet. You are encouraged to eat a variety of protein foods to ensure that you get all the essential nutrients that are found in different protein foods. Talk with your health care provider or dietitian about how much protein you need each day and which sources of protein are best for you. Protein is especially important for: Repairing and making cells and tissues. Fighting infection. Providing energy. Growth and development. See the following list for the protein content of some common foods. What are tips for getting more protein in your diet? Try to replace processed carbohydrates with high-quality protein. Snack on nuts and seeds instead of chips. Replace baked desserts with [place] yogurt. Eat protein foods from both plant and animal sources. Replace red meat with seafood. Add beans and peas to salads, soups, and side dishes. Include a protein food with each meal and snack. Reading food labels You can find the amount of protein in a food item by looking at the nutrition facts label. Use the total grams listed to help you reach your daily goal. What foods are high in protein?  High-protein foods contain 4 grams (g) or more of protein per serving. They include: Grains Quinoa (cooked) -- 1 cup (185 g) has 8 g of protein. Whole wheat pasta (cooked) -- 1 cup (140 g) has 6 g of protein. Meat Beef, ground sirloin (cooked) -- 3 oz (85 g) has 24 g of protein. Chicken breast, boneless and skinless (cooked) -- 3 oz (85 g) has 25 g of protein. Egg -- 1 egg has 6 g of protein. Fish, filet (cooked) -- 1 oz (28 g) has 6-7 g of protein. Lamb (cooked) -- 3 oz (85 g) has 24 g of protein. Pork tenderloin (cooked) -- 3 oz (85 g) has 23 g of protein. Tuna (canned in water) -- 3 oz (85 g) has 20 g of  protein. Dairy Cottage cheese --  cup (114 g) has 13.4 g of protein. Milk -- 1 cup (237 mL) has 8 g of protein. Cheese (hard) -- 1 oz (28 g) has 7 g of protein. Yogurt, regular -- 6 oz (170 g) has 8 g of protein. Greek yogurt -- 6 oz (200 g) has 18 g protein. Plant protein Garbanzo beans (canned or cooked) --  cup (130 g) has 6-7 g of protein. Kidney beans (canned or cooked) --  cup (130 g) has 6-7 g of protein. Nuts (peanuts, pistachios, almonds) -- 1 oz (28 g) has 6 g of protein. Peanut butter -- 1 oz (32 g) has 7-8 g of protein. Pumpkin seeds -- 1 oz (28 g) has 8.5 g of protein. Soybeans (roasted) -- 1 oz (28 g) has 8 g of protein. Soybeans (cooked) --  cup (90 g) has 11 g of protein. Soy milk -- 1 cup (250 mL) has 5-10 g of protein. Soy or vegetable patty -- 1 patty has 11 g of protein. Sunflower seeds -- 1 oz (28 g) has 5.5 g of protein. Buckwheat -- 1 oz (33 g) has 4.3 g of protein. Tofu (firm) --  cup (124 g) has 20 g of protein. Tempeh --  cup (83 g) has 16 g of protein. The items listed above may not be a complete list of foods high in protein. Actual amounts of protein may differ  depending on processing. Contact a dietitian for more information. What foods are low in protein?  Low-protein foods contain 3 grams (g) or less of protein per serving. They include: Fruits Fruit or vegetable juice --  cup (125 mL) has 1 g of protein. Vegetables Beets (raw or cooked) --  cup (68 g) has 1.5 g of protein. Broccoli (raw or cooked) --  cup (44 g) has 2 g of protein. Collard greens (raw or cooked) --  cup (42 g) has 2 g of protein. Green beans (raw or cooked) --  cup (83 g) has 1 g of protein. Green peas (canned) --  cup (80 g) has 3.5 g of protein. Potato (baked with skin) -- 1 medium potato (173 g) has 3 g of protein. Spinach (cooked) --  cup (90 g) has 3 g of protein. Squash (cooked) --  cup (90 g) has 1.5 g of protein. Avocado -- 1 cup (146 g) has 2.7 g of  protein. Grains Bran cereal --  cup (30 g) has 2-3 g of protein. Bread -- 1 slice has 2.5 g of protein. Corn (fresh or cooked) --  cup (77 g) has 2 g of protein. Flour tortilla -- One 6-inch (15 cm) tortilla has 2.5 g of protein. Muffins -- 1 small muffin (2 oz or 57 g) has 3 g of protein. Oatmeal (cooked) --  cup (40 g) has 3 g of protein. Rice (cooked) --  cup (79 g) has 2.5-3.5 g of protein. Dairy Cream cheese -- 1 oz (29 g) has 2 g of protein. Creamer (half-and-half) -- 1 oz (29 mL) has 1 g of protein. Frozen yogurt --  cup (72 g) has 3 g of protein. Sour cream --  cup (75 g) has 2.5 g of protein. The items listed above may not be a complete list of foods low in protein. Actual amounts of protein may differ depending on processing. Contact a dietitian for more information. Summary Protein is a nutrient that your body needs for growth and development, repairing and making cells and tissues, fighting infection, and providing energy. Protein is in both plant and animal foods. Some of these foods have more protein than others. Depending on your overall health, you may need more or less protein in your diet. Talk to your health care provider about how much protein you need.  This information is not intended to replace advice given to you by your health care provider. Make sure you discuss any questions you have with your health care provider. Document Revised: 03/09/2020 Document Reviewed: 03/09/2020 Elsevier Patient Education  [place].  1) Hold Lisinopril 2) Start Lopressor 50-12.5 mg daily. If unable to fill then use your metoprolol twice a day and pick up hydrochlorothiazide. 3) Continue Nifedipine as prescribed.

## 2022-04-07 ENCOUNTER — Ambulatory Visit: Payer: Self-pay

## 2022-04-07 ENCOUNTER — Other Ambulatory Visit (HOSPITAL_COMMUNITY): Payer: Self-pay

## 2022-04-07 ENCOUNTER — Other Ambulatory Visit: Payer: Self-pay

## 2022-04-07 MED ORDER — METOPROLOL-HYDROCHLOROTHIAZIDE 50-25 MG PO TABS
1.0000 | ORAL_TABLET | Freq: Every day | ORAL | 0 refills | Status: DC
  Filled 2022-04-07: qty 30, 30d supply, fill #0

## 2022-04-07 MED ORDER — HYDROCHLOROTHIAZIDE 25 MG PO TABS
25.0000 mg | ORAL_TABLET | Freq: Every day | ORAL | 0 refills | Status: DC
  Filled 2022-04-07: qty 30, 30d supply, fill #0

## 2022-04-07 MED ORDER — HYDROCHLOROTHIAZIDE 25 MG PO TABS
25.0000 mg | ORAL_TABLET | Freq: Every day | ORAL | 0 refills | Status: DC
  Filled 2022-04-07 – 2022-04-08 (×2): qty 30, 30d supply, fill #0

## 2022-04-07 MED ORDER — METOPROLOL-HYDROCHLOROTHIAZIDE 50-25 MG PO TABS: 1.0000 | ORAL_TABLET | Freq: Every day | ORAL | 0 refills | Status: DC

## 2022-04-07 MED ORDER — HYDROCHLOROTHIAZIDE 25 MG PO TABS: 25.0000 mg | ORAL_TABLET | Freq: Every day | ORAL | 0 refills | Status: DC

## 2022-04-07 NOTE — Lactation Note (Signed)
This note was copied from a baby's chart.  NICU Lactation Consultation Note  Patient Name: Jane Schultz Today's Date: 04/07/2022 Age:40 days   Subjective Reason for consult: Follow-up assessment; Primapara; 1st time breastfeeding; NICU baby; Preterm <34wks  In person interpretor utilized.  Lactation followed up with Jane Schultz upon request. She had some questions regarding her milk supply and her maternal medications. We looked at some of the medications she is receiving; her OB is recommending a change in medication due to infant risk. She will be discontinuing Lisonopril (L3) and beginning Lopressor and Chlorothiazide. Lopressor is an L2 and chlorothiazide is an L3. The latter medication may lower maternal milk volume.  Jane Schultz has been taking Lasix, a medication that is known to lower maternal milk volume. She will be discontinuing this by 12/22. We discussed maternal indications for these medications. I recommended that she pump q3 hours and allow 5 additional days to see how her body and maternal milk volume responds to these changes in medication.   Lactation to monitor and follow up.  Objective Infant data: Mother's Current Feeding Choice: Breast Milk   Maternal data: [id number]  C-Section, Low Transverse Current breast feeding challenges:: NICU; milk supply challenges  Does the patient have breastfeeding experience prior to this delivery?: No  Pumping frequency: is aware of rec to pump q3 hours; has not been pumping consistently Pumped volume: 30 mL  Risk factor for low milk supply:: maternal meds known for lowering milk volume   WIC Program: Yes WIC Referral Sent?: Yes Pump:  (WIC to follow up on 12/19 at 1100)   Intervention/Plan Interventions: Breast feeding basics reviewed; Education  Pump Education: Setup, frequency, and cleaning  Plan: Consult Status: NICU follow-up  NICU Follow-up type: New admission follow up; Verify onset of copious  milk    Lenore Manner 04/07/2022, 6:17 PM

## 2022-04-08 ENCOUNTER — Other Ambulatory Visit (HOSPITAL_COMMUNITY): Payer: Self-pay

## 2022-04-09 ENCOUNTER — Inpatient Hospital Stay (HOSPITAL_COMMUNITY)
Admission: AD | Admit: 2022-04-09 | Discharge: 2022-04-09 | Disposition: A | Discharge status: Home / Self Care | Payer: Medicaid Other | Attending: Obstetrics & Gynecology | Admitting: Obstetrics & Gynecology

## 2022-04-09 DIAGNOSIS — T8189XA Other complications of procedures, not elsewhere classified, initial encounter: Secondary | ICD-10-CM | POA: Diagnosis not present

## 2022-04-09 DIAGNOSIS — O8600 Infection of obstetric surgical wound, unspecified: Secondary | ICD-10-CM | POA: Insufficient documentation

## 2022-04-09 DIAGNOSIS — Z98891 History of uterine scar from previous surgery: Secondary | ICD-10-CM

## 2022-04-09 DIAGNOSIS — O909 Complication of the puerperium, unspecified: Secondary | ICD-10-CM | POA: Insufficient documentation
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
## 2022-04-09 NOTE — MAU Note (Incomplete)
...  Jane Schultz is a 41 y.o. at Unknown here in MAU reporting: She would like to have her incision re-evaluated. She reports she was evaluated in the office on Wednesday after being evaluated on Tuesday in MAU for bleeding from her incision. She reports in the office on Wednesday they drained her incision of blood and did "three different" things to keep her incision closed. She reports they instructed her to remove her bandage yesterday but she removed it this morning. She also reports they told her that the bleeding and drainage would stop yesterday. She reports she is still seeing bleeding this morning and wants to ensure it is okay.   Dr. Clement Husbands, MD in triage. In person ASL interpreter also in triage.  Pain score: Denies pain. :

## 2022-04-13 ENCOUNTER — Other Ambulatory Visit (HOSPITAL_COMMUNITY): Payer: Self-pay

## 2022-04-13 ENCOUNTER — Ambulatory Visit: Payer: Medicaid Other | Admitting: Obstetrics and Gynecology

## 2022-04-13 ENCOUNTER — Ambulatory Visit: Payer: Self-pay

## 2022-04-13 NOTE — Progress Notes (Deleted)
    GYNECOLOGY PROGRESS NOTE  Subjective:    Patient ID: Jane Schultz, female    DOB: 04/22/1980, 41 y.o.   MRN: [id number]  HPI  Patient is a 41 y.o. G56P0202 female who presents for   {Common ambulatory SmartLinks:19316}  Review of Systems {ros; complete:30496}   Objective:   currently breastfeeding. There is no height or weight on file to calculate BMI. General appearance: {general exam:16600} Abdomen: {abdominal exam:16834} Pelvic: {pelvic exam:16852::"cervix normal in appearance","external genitalia normal","no adnexal masses or tenderness","no cervical motion tenderness","rectovaginal septum normal","uterus normal size, shape, and consistency","vagina normal without discharge"} Extremities: {extremity exam:5109} Neurologic: {neuro exam:17854}   Assessment:   No diagnosis found.   Plan:   There are no diagnoses linked to this encounter.

## 2022-04-13 NOTE — Lactation Note (Signed)
This note was copied from a baby's chart.  NICU Lactation Consultation Note  Patient Name: Jane Schultz Today's Date: 04/13/2022 Age:40 wk.o.   Subjective Reason for consult: Follow-up assessment; NICU baby; Infant < 6lbs; Preterm <34wks  Lactation followed up with the assistance of a sign language interpretor. I followed up with Jane Schultz on her milk supply. She has been rooming in with her daughter. She reports that her milk volume has increased substantially. She has stopped taking lasix, and her provider changed another one of her medications. Lexys states that she tries to pump q2 hours during the day. Pumping over night has been difficulty due to maternal exhaustion.   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 I encouraged her to continue with her regimen and to add in a pumping session overnight if she feels physically able.  Objective Infant data: Mother's Current Feeding Choice: Breast Milk  Maternal data: [id number]  C-Section, Low Transverse Current breast feeding challenges:: NICU  Pumping frequency: q2 hours during the day; not pumping overnight (8+ hour interval) Pumped volume: 90 mL  Risk factor for low milk supply:: some maternal medications may suppress lactation; not pumping overnight  WIC Program: Yes WIC Referral Sent?: Yes Pump:  (WIC to follow up on 12/19 at 1100)  Assessment  Maternal: Milk volume: Normal  Intervention/Plan Interventions: Breast feeding basics reviewed; Education  Tools: Pump Pump Education: Setup, frequency, and cleaning  Plan: Consult Status: NICU follow-up  NICU Follow-up type: Weekly NICU follow up    Lenore Manner 04/13/2022, 10:09 AM

## 2022-04-14 ENCOUNTER — Telehealth: Payer: Self-pay | Admitting: Family Medicine

## 2022-04-14 NOTE — Progress Notes (Cosign Needed)
    NURSE VISIT NOTE  Subjective:    Patient ID: Jane Schultz, female    DOB: 14-Dec-1980, 41 y.o.   MRN: [id number]  HPI  Patient is a 41 y.o. [phone] female who presents for BP check per order from Rubie Maid, MD. Incision check at this apt as well  Patient reports compliance with prescribed BP medications: BP Readings from Last 3 Encounters:  04/15/22 116/77  04/09/22 117/73  04/06/22 (!) 190/85   Pulse Readings from Last 3 Encounters:  04/15/22 83  04/09/22 68  04/06/22 67    Objective:    BP 116/77   Pulse 83   Ht '5\' 7"'$  (1.702 m)   Wt 235 lb 9.6 oz (106.9 kg)   Breastfeeding Yes   BMI 36.90 kg/m   Assessment:   No diagnosis found.   Plan:   Per Dr. Rubie Maid, MD:    Patient verbalized understanding of instructions.   Landis Gandy, [place]

## 2022-04-14 NOTE — Telephone Encounter (Signed)
Called patient with the help of an interpreter, when the patient was asked if she was available to come to our office for an incision check tomorrow morning she stated she would not have a ride to [place]. I asked if she was going to be able to go to her appointment tomorrow afternoon at [facility] and she stated that she would be able to go to that appointment. I then called [facility] and asked if an incision check could be added to her BP check appointment with them and they stated they would have Dr. Marcelline Mates come into the nurse visit and check her incision site there. Patient is aware of the plan for the appointment and aware that both her BP and her incision site will be addressed at the [facility] appointment.

## 2022-04-15 ENCOUNTER — Ambulatory Visit (INDEPENDENT_AMBULATORY_CARE_PROVIDER_SITE_OTHER): Payer: Medicaid Other

## 2022-04-15 ENCOUNTER — Telehealth: Payer: Self-pay

## 2022-04-15 ENCOUNTER — Ambulatory Visit (INDEPENDENT_AMBULATORY_CARE_PROVIDER_SITE_OTHER): Payer: Medicaid Other | Admitting: Obstetrics and Gynecology

## 2022-04-15 VITALS — BP 116/77 | HR 83 | Ht 67.0 in | Wt 235.6 lb

## 2022-04-15 VITALS — BP 129/86 | HR 86

## 2022-04-15 DIAGNOSIS — Z5189 Encounter for other specified aftercare: Secondary | ICD-10-CM

## 2022-04-15 DIAGNOSIS — E119 Type 2 diabetes mellitus without complications: Secondary | ICD-10-CM | POA: Diagnosis not present

## 2022-04-15 DIAGNOSIS — Z0130 Encounter for examination of blood pressure without abnormal findings: Secondary | ICD-10-CM

## 2022-04-15 DIAGNOSIS — I10 Essential (primary) hypertension: Secondary | ICD-10-CM

## 2022-04-15 DIAGNOSIS — Z794 Long term (current) use of insulin: Secondary | ICD-10-CM

## 2022-04-15 DIAGNOSIS — Z7984 Long term (current) use of oral hypoglycemic drugs: Secondary | ICD-10-CM | POA: Diagnosis not present

## 2022-04-15 DIAGNOSIS — Z98891 History of uterine scar from previous surgery: Secondary | ICD-10-CM

## 2022-04-15 NOTE — Telephone Encounter (Signed)
Spoke with Rosie at [employer] to check on status of compression stockings and breast pump for this patient. Rosie reports they began emailing her on 11/7 to advise that her insurance asks that she go thru a specific provider to get her compression stockings and breast pump. [employer] is not contracted with her insurance company. [employer] was not given the name of the covered provider for these devices.

## 2022-04-15 NOTE — Telephone Encounter (Signed)
Patient notified by April M ([employer] account planner) while patient in office. A form was completed online for the compression stockings and breast pump thru [employer]. Patient will receive an email confirmation. [employer] [facility] will fax a prescription to our office for signature for these devices. We will contact patient when these have been signed and submitted. Patient advised via my chart message.

## 2022-04-16 ENCOUNTER — Other Ambulatory Visit: Payer: Self-pay

## 2022-04-16 ENCOUNTER — Inpatient Hospital Stay (HOSPITAL_COMMUNITY)
Admission: AD | Admit: 2022-04-16 | Discharge: 2022-04-16 | Disposition: A | Discharge status: Home / Self Care | Payer: Medicaid Other | Attending: Obstetrics and Gynecology | Admitting: Obstetrics and Gynecology

## 2022-04-16 DIAGNOSIS — O9953 Diseases of the respiratory system complicating the puerperium: Secondary | ICD-10-CM | POA: Diagnosis not present

## 2022-04-16 DIAGNOSIS — O165 Unspecified maternal hypertension, complicating the puerperium: Secondary | ICD-10-CM | POA: Insufficient documentation

## 2022-04-16 DIAGNOSIS — O9089 Other complications of the puerperium, not elsewhere classified: Secondary | ICD-10-CM | POA: Insufficient documentation

## 2022-04-16 DIAGNOSIS — T8189XA Other complications of procedures, not elsewhere classified, initial encounter: Secondary | ICD-10-CM

## 2022-04-16 DIAGNOSIS — O2493 Unspecified diabetes mellitus in the puerperium: Secondary | ICD-10-CM | POA: Insufficient documentation

## 2022-04-16 DIAGNOSIS — O99325 Drug use complicating the puerperium: Secondary | ICD-10-CM | POA: Diagnosis not present

## 2022-04-16 DIAGNOSIS — Z789 Other specified health status: Secondary | ICD-10-CM | POA: Diagnosis not present

## 2022-04-16 NOTE — Progress Notes (Signed)
Incision irrigated and packed by S. Rollene Rotunda, CNM.  Pt given instructions regarding irrigating & packing incision per CNM with pt understanding verbalized.  Pt also given supplies for packing & irrigating incision.

## 2022-04-16 NOTE — MAU Provider Note (Signed)
History     CSN: [id number]  Arrival date and time: 04/16/22 1223   Event Date/Time   First Provider Initiated Contact with Patient 04/16/22 1420      Chief Complaint  Patient presents with   Incision check   Jane Schultz , a  41 y.o. [id number] at 2 weeks postpartum presents to MAU with complaints of incisional drainage and pus from c/s incision. Patient reports she was seen in the office yesterday for a incision check and it was packed. This AM she reports both blood and pus oozing from site throughout the day today. She denies running a fever and denies a foul odor. She endorses pain rating it a 4/10 but states its more related to postpartum discomfort rather than the incision. Patients baby upstairs in NICU.          OB History     Gravida  2   Para  2   Term  0   Preterm  2   AB      Living  2      SAB      IAB      Ectopic      Multiple  0   Live Births  1           Past Medical History:  Diagnosis Date   Asthma    Deaf    Diabetes mellitus without complication ([place])    Hypertension     Past Surgical History:  Procedure Laterality Date   CESAREAN SECTION     CESAREAN SECTION N/A 03/30/2022   Procedure: CESAREAN SECTION;  Surgeon: Florian Buff, MD;  Location: [facility];  Service: Obstetrics;  Laterality: N/A;   EXCISION VAGINAL CYST N/A 05/13/2021   Procedure: Incision and Drainage;  Surgeon: Rubie Maid, MD;  Location: [facility] ORS;  Service: Gynecology;  Laterality: N/A;    No family history on file.  Social History   Tobacco Use   Smoking status: Former    Packs/day: 2.00    Types: Cigarettes   Smokeless tobacco: Never  Vaping Use   Vaping Use: Former  Substance Use Topics   Alcohol use: Never   Drug use: Not Currently    Types: Marijuana    Comment: stopped 4 mths ago    Allergies: No Known Allergies  Medications Prior to Admission  Medication Sig Dispense Refill Last Dose   Accu-Chek Softclix Lancets  lancets Use As Directed 100 each 0    Blood Glucose Monitoring Suppl (ACCU-CHEK GUIDE) w/Device KIT Use as directed. 1 kit 0    Continuous Blood Gluc Receiver (DEXCOM G6 RECEIVER) DEVI Check blood sugars 4 x daily (fasting and 2 hours postprandial) 1 each 0    Continuous Blood Gluc Sensor (DEXCOM G6 SENSOR) MISC Check blood sugars 4 x daily (fasting and 2 hours postprandial) 3 each 6    Continuous Blood Gluc Transmit (DEXCOM G6 TRANSMITTER) MISC Check blood sugars 4 x daily (fasting and 2 hours postprandial) 1 each 0    cyclobenzaprine (FLEXERIL) 10 MG tablet Take 1 tablet (10 mg total) by mouth 3 (three) times daily as needed for muscle spasms. 30 tablet 0    furosemide (LASIX) 40 MG tablet Take 1 tablet (40 mg total) by mouth 2 (two) times daily for 7 days. 14 tablet 0    glucose blood (ACCU-CHEK GUIDE) test strip Use to check blood sugars as instructed 200 each 12    hydrochlorothiazide (HYDRODIURIL) 25 MG tablet Take 1 tablet (25  mg total) by mouth daily. 30 tablet 0    ibuprofen (ADVIL) 600 MG tablet Take 1 tablet (600 mg total) by mouth every 8 (eight) hours as needed for moderate pain or cramping. 30 tablet 0    insulin glargine (LANTUS) 100 unit/mL SOPN Inject 10 Units into the skin daily. 3 mL 0    Insulin Pen Needle (BD PEN NEEDLE NANO 2ND GEN) 32G X 4 MM MISC Use to inject insulin. 30 each 1    levonorgestrel (MIRENA) 20 MCG/DAY IUD 1 each by Intrauterine route once for 1 dose. 1 each 0    lisinopril (ZESTRIL) 10 MG tablet Take 1 tablet (10 mg total) by mouth daily. 30 tablet 0    metFORMIN (GLUCOPHAGE) 1000 MG tablet Take 1 tablet (1,000 mg total) by mouth 2 (two) times daily with a meal. 60 tablet 0    metoprolol tartrate (LOPRESSOR) 50 MG tablet Take 1 tablet (50 mg total) by mouth daily. 30 tablet 0    metoprolol-hydrochlorothiazide (LOPRESSOR HCT) 50-25 MG tablet Take 1 tablet by mouth daily. 30 tablet 0    NIFEdipine (ADALAT CC) 60 MG 24 hr tablet Take 1 tablet (60 mg total) by  mouth 2 (two) times daily. 60 tablet 0    oxyCODONE (OXY IR/ROXICODONE) 5 MG immediate release tablet Take 1-2 tablets (5-10 mg total) by mouth every 4 (four) hours as needed for moderate pain. 30 tablet 0    Prenatal MV & Min w/FA-DHA (PRENATAL ADULT GUMMY/DHA/FA) 0.4-25 MG CHEW Chew 1 each by mouth daily. 30 tablet 3     Review of Systems  Constitutional:  Negative for chills, fatigue and fever.  Eyes:  Negative for pain and visual disturbance.  Respiratory:  Negative for apnea, shortness of breath and wheezing.   Cardiovascular:  Negative for chest pain and palpitations.  Gastrointestinal:  Negative for abdominal pain, constipation, diarrhea, nausea and vomiting.  Genitourinary:  Negative for difficulty urinating, dysuria, pelvic pain, vaginal bleeding, vaginal discharge and vaginal pain.  Musculoskeletal:  Negative for back pain.  Neurological:  Negative for seizures, weakness and headaches.  Psychiatric/Behavioral:  Negative for suicidal ideas.    Physical Exam   Blood pressure (!) 143/86, pulse 95, temperature 99.4 F (37.4 C), temperature source Oral, resp. rate 18, height _0  (1.702 m), weight 106.2 kg, SpO2 97 %, currently breastfeeding.  Physical Exam Vitals and nursing note reviewed.  Constitutional:      General: She is not in acute distress.    Appearance: Normal appearance.  HENT:     Head: Normocephalic.  Pulmonary:     Effort: Pulmonary effort is normal.  Abdominal:     Palpations: Abdomen is soft.     Comments: Small 2cm pocket noted middle of incision leaking serosanguinous, and brown pus-like liquid. When pressure is applied to either side, moderate amount of drainage observed.  2cm depth identified with Q-tip. MD called to bedside for evaluation.   Musculoskeletal:     Cervical back: Normal range of motion.  Skin:    General: Skin is warm and dry.  Neurological:     Mental Status: She is alert and oriented to person, place, and time.  Psychiatric:         Mood and Affect: Mood normal.     MAU Course  Procedures Orders Placed This Encounter  Procedures   Aerobic Culture w Gram Stain (superficial specimen)   Discharge patient    MDM - Wound cultures collected.  - Consulted Dr. Mikel Cella on incision  and incisional drainage s/p 17 day PP C/S. Fascia noted to be completely intact.  - MD to bedside to assess. Per MD irrigate and loosely pack wound until healed. Follow up with [facility] next week for wound check.  - Wound irrigated and packed with sterile packing and covered with (2) 4x4 gauze.  - Plan for discharge with precautions.    Assessment and Plan   1. Problem involving surgical incision   2. Postpartum care and examination   3. Language barrier    - Instructions provided on drainage, prepping and packing wound for optimal wound healing. Patient verbalized understanding.  - Signs of infection and worsening precautions reviewed.  - PP precautions reviewed.  - Message sent to [facility] to get patient scheduled for Incision check next week.  - Patient discharged home in stable condition and may return to MAU as needed.  Due to language barrier, an interpreter was present during the history-taking and subsequent discussion (and for part of the physical exam) with this patient. - [employer] video interpreter used for the duration of this visit.   Jacquiline Doe, MSN CNM  04/16/2022, 2:20 PM

## 2022-04-16 NOTE — MAU Note (Signed)
Jane Schultz is a 41 y.o. at Unknown here in MAU reporting: she's here for a incision check. States was seen in office yesterday for same, incision was packed, but woke up this morning with incision oozing red/yellow liquid.  Reports has oozed twice today, more the second time. S/P Cesarean Section 03/30/22 LMP: NA Onset of complaint: today Pain score: 4 Vitals:   04/16/22 1326  BP: (!) 143/86  Pulse: 95  Resp: 18  Temp: 99.4 F (37.4 C)  SpO2: 97%     FHT:NA Lab orders placed from triage:   None

## 2022-04-16 NOTE — Progress Notes (Signed)
Huntley Dec, CNM into see pt and eval incision.

## 2022-04-16 NOTE — Progress Notes (Signed)
Dr. Mikel Cella into eval incision per S. Rollene Rotunda, CNM request.  Dr. Mikel Cella assessed incision, recommended incision  be irrigated and loosely packed daily.  Pt to return to office next week for f/u visit.

## 2022-04-17 ENCOUNTER — Encounter: Payer: Self-pay | Admitting: Obstetrics and Gynecology

## 2022-04-17 NOTE — Progress Notes (Signed)
    GYNECOLOGY PROGRESS NOTE  Subjective:    Patient ID: Jane Schultz, female    DOB: [birth year], 41 y.o.   MRN: [id number]  HPI  Patient is a 41 y.o. [id number] female who presents for BP check and incision check. She is ~ 2 weeks postpartum s/p C-section for severe pre-eclampsia and pre-existing diabetes at 28.5 weeks.  Notes that she has seen some drainage from her incision since yesterday. Reports that she has been taking her medications as prescribed (changed from Lisinopril to Metoprolol, and added HCTZ, continued Procardia).  BPs much more improved   The following portions of the patient's history were reviewed and updated as appropriate: allergies, current medications, past family history, past medical history, past social history, past surgical history, and problem list.  Review of Systems Pertinent items noted in HPI and remainder of comprehensive ROS otherwise negative.   Objective:   Blood pressure 129/86, pulse 86, currently breastfeeding.  There is no height or weight on file to calculate BMI. General appearance: alert and no distress Abdomen:  soft, non-tender. Incision site well healed except small defect ~ 1.5 cm in midline of incision, with depth of ~ 2 cm which spreads laterally ~ 3 cm.  Fascia appears to be intact  Moderate brown serosanguinous drainage expressed  on exploration of defect  Pelvic: deferred.    Assessment:   1. Visit for wound check   2. Status post cesarean delivery   3. Blood pressure check   4. Type 2 diabetes mellitus without complication, without long-term current use of insulin ([place])   5. Primary hypertension      Plan:   1. Visit for wound check s/p C-section - Superficial wound dehiscence noted. Area cleaned, packed with iodoform gauze. Given instructions on wound care. Also given supplies.    2. Blood pressure check for primary HTN - BPs much improved since changing of medications. Patient keeping BP log. Notes one  significant elevation after argument with FOB. Patient notes that she is otherwise trying to maintain her composure.   3. Type 2 diabetes mellitus without complication, without long-term current use of insulin ([facility]) - DM currently on Insulin and Metformin.     Rubie Maid, MD [facility]

## 2022-04-19 LAB — AEROBIC CULTURE W GRAM STAIN (SUPERFICIAL SPECIMEN)

## 2022-04-20 ENCOUNTER — Other Ambulatory Visit: Payer: Self-pay

## 2022-04-20 ENCOUNTER — Encounter: Payer: Self-pay | Admitting: Family Medicine

## 2022-04-20 ENCOUNTER — Inpatient Hospital Stay (HOSPITAL_COMMUNITY)
Admission: AD | Admit: 2022-04-20 | Discharge: 2022-04-20 | Disposition: A | Discharge status: Home / Self Care | Payer: Medicaid Other | Attending: Obstetrics and Gynecology | Admitting: Obstetrics and Gynecology

## 2022-04-20 DIAGNOSIS — O9089 Other complications of the puerperium, not elsewhere classified: Secondary | ICD-10-CM | POA: Diagnosis not present

## 2022-04-20 DIAGNOSIS — I10 Essential (primary) hypertension: Secondary | ICD-10-CM | POA: Diagnosis not present

## 2022-04-20 DIAGNOSIS — Z7984 Long term (current) use of oral hypoglycemic drugs: Secondary | ICD-10-CM | POA: Diagnosis not present

## 2022-04-20 DIAGNOSIS — Z794 Long term (current) use of insulin: Secondary | ICD-10-CM | POA: Diagnosis not present

## 2022-04-20 DIAGNOSIS — Z87891 Personal history of nicotine dependence: Secondary | ICD-10-CM | POA: Diagnosis not present

## 2022-04-20 DIAGNOSIS — Z5941 Food insecurity: Secondary | ICD-10-CM | POA: Insufficient documentation

## 2022-04-20 DIAGNOSIS — O900 Disruption of cesarean delivery wound: Secondary | ICD-10-CM | POA: Insufficient documentation

## 2022-04-20 DIAGNOSIS — Z3A00 Weeks of gestation of pregnancy not specified: Secondary | ICD-10-CM | POA: Diagnosis not present

## 2022-04-20 DIAGNOSIS — E119 Type 2 diabetes mellitus without complications: Secondary | ICD-10-CM | POA: Insufficient documentation

## 2022-04-20 NOTE — Progress Notes (Signed)
Incision repacked and redressed by Dr. Dione Plover.

## 2022-04-20 NOTE — Progress Notes (Signed)
Dr. Dione Plover informed of pt's BP, no new orders received.  Pt scheduled for f/u visit in office tomorrow morning.

## 2022-04-20 NOTE — Progress Notes (Signed)
Dr. Dione Plover into see pt & evaluate incision.  Dressing and packing removed.  Incision has small amount of yellow drainage.  Pt encouraged to keep packing, offered antibiotics secondary wound culture grew small bacteria.  Pt informed antibiotics not necessary but may aide with wound healing and add pt reassurance. Pt informed wound will continue to heal although slowly.  Pt signed she understands
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
## 2022-04-20 NOTE — MAU Note (Signed)
Jane Schultz is a 42 y.o. at Unknown here in MAU reporting: she received message on My Chart that she may have an infection in her incision.  Reports she's been keeping it clean, but now noticing an odor when changing the packing.  Reports pain @ incision site when cleaning otherwise no pain noted.  Reports sharp,  intermittent cramping pain in lower back pain LMP: NA Onset of complaint: a couple weeks Pain score: 7 Vitals:   04/20/22 1108  BP: (!) 150/88  Pulse: 85  Resp: 20  Temp: 98.8 F (37.1 C)  SpO2: 99%     FHT:NA Lab orders placed from triage:   None

## 2022-04-21 ENCOUNTER — Ambulatory Visit (INDEPENDENT_AMBULATORY_CARE_PROVIDER_SITE_OTHER): Payer: Medicaid Other | Admitting: *Deleted

## 2022-04-21 VITALS — BP 133/81 | HR 70 | Ht 67.0 in | Wt 232.2 lb

## 2022-04-21 DIAGNOSIS — Z4889 Encounter for other specified surgical aftercare: Secondary | ICD-10-CM

## 2022-04-21 NOTE — Telephone Encounter (Signed)
Spoke with representative at [employer]. Advised we received the breast pump rx via fax for signature . However, we have not received one for the compression stockings. Representative reports they will check the system for orders and send rx via fax for signature/contact patient if needed.

## 2022-04-21 NOTE — Progress Notes (Signed)
Patient here for incision check s/p C/S 03/30/22 with several visits with wound issues and repacking.  Seen last in MAU yesterday by Dr. Dione Plover.   BP 133/81. Incision covered by ob pad , pad removed with scant yellowish discharge. Wound intact except for one area with packing evident. Dr. Dione Plover in to view wound. Patient had changed packing this am and applied pad. No new orders. Wound covered with abd pad. Patient instructed to come for wound check in one week. She voices understanding. Staci Acosta

## 2022-04-22 ENCOUNTER — Ambulatory Visit: Payer: Self-pay

## 2022-04-22 NOTE — Lactation Note (Signed)
This note was copied from a baby's chart.  NICU Lactation Consultation Note  Patient Name: Jane Schultz Today's Date: 04/22/2022 Age:41 wk.o.  Subjective Reason for consult: Follow-up assessment; NICU baby; Infant < 6lbs; Preterm <34wks; Maternal endocrine disorder; Other (Comment) (ASL)  Used [employer] interpreter services for sign language; interpreter # [id number] "Jane Schultz".  Visited with family of 19 29/24 weeks old (adjusted) NICU female, Ms. Massar repots she continues trying pumping every 3 hours but that it's been very challenging due to time constraints and physical exhaustion. Her milk volumes have decreased since the last time she saw lactation. She hasn't picked up her WIC pump yet; she's been rooming in with baby. When asked regarding her WIC appt, she said that she'll be picking up her pump from [facility] once baby is discharged, she'll be using the pump in baby's room while at the hospital. She inquired about what she can do to increase her supply. Explained the importance of consistent pumping to protect her supply and also reviewed additional strategies to increase it. Reviewed pumping schedule, hydration, lactogenesis III and expectations.   Objective Infant data: Mother's Current Feeding Choice: Breast Milk and Donor Milk  Infant feeding assessment Scale for Readiness: 1  Maternal data: G2P0202  C-Section, Low Transverse Pumping frequency: 2 times/24 hours Pumped volume: 20 mL (20-30 ml) Flange Size: 24 WIC Program: Yes WIC Referral Sent?: Yes Pump:  (WIC called her on 04/21/2022 but she hasn't picked up her pump yet, she's using the one in baby's room)  Assessment Infant: In NICU  Maternal: Milk volume: Low  Intervention/Plan Interventions: Breast feeding basics reviewed; DEBP; Education Tools: Pump; Flanges; Coconut oil Pump Education: Setup, frequency, and cleaning; Milk Storage  Plan of care: Encouraged to start pumping consistently every 3  hours, ideally 8 pumping sessions/24 hours or as closes as she can get to it Power pumping was also encouraged in the AM She'll try to increase her water intake until her urine is no longer dark or medium yellow (should be pale yellow/clear)  No other support person at this time. All questions and concerns answered, family to contact [facility] services PRN.  Consult Status: NICU follow-up  NICU Follow-up type: Weekly NICU follow up   [place] 04/22/2022, 1:19 PM

## 2022-04-26 ENCOUNTER — Ambulatory Visit: Payer: Medicaid Other | Admitting: Cardiovascular Disease

## 2022-04-28 ENCOUNTER — Other Ambulatory Visit (HOSPITAL_COMMUNITY): Payer: Self-pay

## 2022-05-02 ENCOUNTER — Ambulatory Visit: Payer: Self-pay

## 2022-05-02 ENCOUNTER — Encounter: Payer: Self-pay | Admitting: Certified Nurse Midwife

## 2022-05-02 ENCOUNTER — Ambulatory Visit (INDEPENDENT_AMBULATORY_CARE_PROVIDER_SITE_OTHER): Payer: Medicaid Other | Admitting: Certified Nurse Midwife

## 2022-05-02 DIAGNOSIS — O8600 Infection of obstetric surgical wound, unspecified: Secondary | ICD-10-CM

## 2022-05-02 DIAGNOSIS — E119 Type 2 diabetes mellitus without complications: Secondary | ICD-10-CM

## 2022-05-02 MED ORDER — DEXCOM G6 TRANSMITTER MISC: 0 refills | Status: DC

## 2022-05-02 MED ORDER — DEXCOM G6 RECEIVER DEVI: 0 refills | Status: DC

## 2022-05-02 MED ORDER — DEXCOM G6 SENSOR MISC: 6 refills | Status: DC

## 2022-05-02 NOTE — Addendum Note (Signed)
Addended by: Landis Gandy on: 05/02/2022 04:06 PM   Modules accepted: Orders

## 2022-05-02 NOTE — Patient Instructions (Signed)
Preventive Care 40-41 Years Old, Female Preventive care refers to lifestyle choices and visits with your health care provider that can promote health and wellness. Preventive care visits are also called wellness exams. What can I expect for my preventive care visit? Counseling Your health care provider may ask you questions about your: Medical history, including: Past medical problems. Family medical history. Pregnancy history. Current health, including: Menstrual cycle. Method of birth control. Emotional well-being. Home life and relationship well-being. Sexual activity and sexual health. Lifestyle, including: Alcohol, nicotine or tobacco, and drug use. Access to firearms. Diet, exercise, and sleep habits. Work and work environment. Sunscreen use. Safety issues such as seatbelt and bike helmet use. Physical exam Your health care provider will check your: Height and weight. These may be used to calculate your BMI (body mass index). BMI is a measurement that tells if you are at a healthy weight. Waist circumference. This measures the distance around your waistline. This measurement also tells if you are at a healthy weight and may help predict your risk of certain diseases, such as type 2 diabetes and high blood pressure. Heart rate and blood pressure. Body temperature. Skin for abnormal spots. What immunizations do I need?  Vaccines are usually given at various ages, according to a schedule. Your health care provider will recommend vaccines for you based on your age, medical history, and lifestyle or other factors, such as travel or where you work. What tests do I need? Screening Your health care provider may recommend screening tests for certain conditions. This may include: Lipid and cholesterol levels. Diabetes screening. This is done by checking your blood sugar (glucose) after you have not eaten for a while (fasting). Pelvic exam and Pap test. Hepatitis B test. Hepatitis C  test. HIV (human immunodeficiency virus) test. STI (sexually transmitted infection) testing, if you are at risk. Lung cancer screening. Colorectal cancer screening. Mammogram. Talk with your health care provider about when you should start having regular mammograms. This may depend on whether you have a family history of breast cancer. BRCA-related cancer screening. This may be done if you have a family history of breast, ovarian, tubal, or peritoneal cancers. Bone density scan. This is done to screen for osteoporosis. Talk with your health care provider about your test results, treatment options, and if necessary, the need for more tests. Follow these instructions at home: Eating and drinking  Eat a diet that includes fresh fruits and vegetables, whole grains, lean protein, and low-fat dairy products. Take vitamin and mineral supplements as recommended by your health care provider. Do not drink alcohol if: Your health care provider tells you not to drink. You are pregnant, may be pregnant, or are planning to become pregnant. If you drink alcohol: Limit how much you have to 0-1 drink a day. Know how much alcohol is in your drink. In the [place] one drink equals one 12 oz bottle of beer (355 mL), one 5 oz glass of wine (148 mL), or one 1 oz glass of hard liquor (44 mL). Lifestyle Brush your teeth every morning and night with fluoride toothpaste. Floss one time each day. Exercise for at least 30 minutes 5 or more days each week. Do not use any products that contain nicotine or tobacco. These products include cigarettes, chewing tobacco, and vaping devices, such as e-cigarettes. If you need help quitting, ask your health care provider. Do not use drugs. If you are sexually active, practice safe sex. Use a condom or other form of protection to   prevent STIs. If you do not wish to become pregnant, use a form of birth control. If you plan to become pregnant, see your health care provider for a  prepregnancy visit. Take aspirin only as told by your health care provider. Make sure that you understand how much to take and what form to take. Work with your health care provider to find out whether it is safe and beneficial for you to take aspirin daily. Find healthy ways to manage stress, such as: Meditation, yoga, or listening to music. Journaling. Talking to a trusted person. Spending time with friends and family. Minimize exposure to UV radiation to reduce your risk of skin cancer. Safety Always wear your seat belt while driving or riding in a vehicle. Do not drive: If you have been drinking alcohol. Do not ride with someone who has been drinking. When you are tired or distracted. While texting. If you have been using any mind-altering substances or drugs. Wear a helmet and other protective equipment during sports activities. If you have firearms in your house, make sure you follow all gun safety procedures. Seek help if you have been physically or sexually abused. What's next? Visit your health care provider once a year for an annual wellness visit. Ask your health care provider how often you should have your eyes and teeth checked. Stay up to date on all vaccines. This information is not intended to replace advice given to you by your health care provider. Make sure you discuss any questions you have with your health care provider. Document Revised: 09/30/2020 Document Reviewed: 09/30/2020 Elsevier Patient Education  [place].

## 2022-05-02 NOTE — Progress Notes (Addendum)
Subjective:    Jane Schultz is a 42 y.o. G39P0202 African American female who presents for a postpartum visit. She is 6 weeks postpartum following a repeat cesarean section, low transverse incision at 28.5 gestational weeks, for severe pre eclampsia , uncontrolled hypertension Anesthesia: spinal. I have fully reviewed the prenatal and intrapartum course. Postpartum course has been complicated with incision hematoma . Baby's course has been complicated with prematurity and remains in the NICU at [facility] .. Bleeding  irregular spotting with IUD . Bowel function is normal. Bladder function is normal. Patient is not sexually active. Contraception method is IUD.   Today she notes she continues to have drainage from the incision, she has been packing as instructed per Jane Schultz with the supplies given. She has run out of supplies.    The following portions of the patient's history were reviewed and updated as appropriate: allergies, current medications, past medical history, past surgical history and problem list.  Review of Systems Pertinent items are noted in HPI.   Vitals:   05/02/22 1314  BP: 133/82  Pulse: 84  Weight: 232 lb (105.2 kg)  Height: '5\' 4"'$  (1.626 m)   No LMP recorded.  Objective:   General:  alert, cooperative and no distress   Breasts:  deferred, no complaints  Lungs: clear to auscultation bilaterally  Heart:  regular rate and rhythm  Abdomen: soft non tender, packing noted in mid abdomen , saturated light pink drainage, right side of incision two small areas noted with pus. No redness , not warm to touch, pt denies fever and pain .    Vulva: normal  Vagina: normal vagina  Cervix:  deferred        Assessment:   Postpartum exam 6 wks s/p repeat cesarean section  Contraception counseling   Plan:  : IUD, consulted Jane Schultz regarding pt incision. He has instructed that the patient is to return to see Jane Schultz tomorrow . Additional wound care  supplies given to the patient today. Reassurance given regarding irregular bleeding with IUD.  Follow up in: 1 day for Wound check with Jane Schultz.   Philip Aspen, CNM

## 2022-05-02 NOTE — Addendum Note (Signed)
Addended by: Landis Gandy on: 05/02/2022 04:10 PM   Modules accepted: Orders

## 2022-05-02 NOTE — Lactation Note (Signed)
This note was copied from a baby's chart.  NICU Lactation Consultation Note  Patient Name: Jane Schultz Today's Date: 05/02/2022 Age:41 wk.o.  Subjective Reason for consult: Follow-up assessment; NICU baby; Infant < 6lbs; Maternal endocrine disorder; Other (Comment); Preterm <34wks (ASL)  Used in-house interpreter for ASL "Jane Schultz".  Visited with family of 69 61/68 weeks old (adjusted) NICU female; Jane Schultz reports that she's still pumping but still not consistently; pumping every 3 hours has become very challenging. Her supply continues to dwindle; revised realistic expectations and let her know that if she continues pumping at this pace; her supply will be gone at some point. She voiced that she doesn't believe in formula; encouraged her to try to get into a pumping routine that will work for her; she's rooming in with baby but had to leave to go to a doctor's appt this afternoon. She requested to be put on a PRN status, since she already knows what to do to increase her supply; she'll try working on pumping on her own and will call as needed.  Objective Infant data: Mother's Current Feeding Choice: Breast Milk and Donor Milk  Infant feeding assessment Scale for Readiness: 2  Maternal data: G2P0202  C-Section, Low Transverse No data recorded Current breast feeding challenges:: NICU admission Pumping frequency: 2-4 times/24 hours Pumped volume: 15 mL (15-20 ml.) Flange Size: 24 Risk factor for low milk supply:: inconsistent pumping as on 05/02/22 WIC Program: Yes WIC Referral Sent?: Yes Pump:  (Fl[facility] called her on 04/21/2022 but she hasn't picked up her pump yet, she's using the one in baby's room)  Assessment Infant: Feeding Status: -- (Scheduled feedings)  Maternal: Milk volume: Low  Intervention/Plan Interventions: Breast feeding basics reviewed; DEBP; Education Tools: Flanges; Pump Pump Education: Setup, frequency, and cleaning; Milk  Storage  Plan of care: Encouraged to start pumping consistently every 3 hours, ideally 8 pumping sessions/24 hours or at her own pace as she wishes She'll call lactation PRN   No other support person at this time. All questions and concerns answered, family to contact [facility] services PRN.  Consult Status: PRN   Jane Schultz Jane Schultz 05/02/2022, 12:03 PM

## 2022-05-03 ENCOUNTER — Other Ambulatory Visit (HOSPITAL_COMMUNITY): Payer: Self-pay

## 2022-05-03 ENCOUNTER — Ambulatory Visit: Payer: Medicaid Other | Admitting: Obstetrics and Gynecology

## 2022-05-03 ENCOUNTER — Inpatient Hospital Stay (HOSPITAL_COMMUNITY)
Admission: AD | Admit: 2022-05-03 | Discharge: 2022-05-03 | Disposition: A | Discharge status: Home / Self Care | Payer: Medicaid Other | Attending: Family Medicine | Admitting: Family Medicine

## 2022-05-03 ENCOUNTER — Telehealth: Payer: Self-pay | Admitting: Obstetrics and Gynecology

## 2022-05-03 ENCOUNTER — Other Ambulatory Visit: Payer: Self-pay | Admitting: Obstetrics and Gynecology

## 2022-05-03 DIAGNOSIS — T8149XA Infection following a procedure, other surgical site, initial encounter: Secondary | ICD-10-CM

## 2022-05-03 DIAGNOSIS — O8601 Infection of obstetric surgical wound, superficial incisional site: Secondary | ICD-10-CM | POA: Diagnosis not present

## 2022-05-03 DIAGNOSIS — E1169 Type 2 diabetes mellitus with other specified complication: Secondary | ICD-10-CM | POA: Insufficient documentation

## 2022-05-03 DIAGNOSIS — O2413 Pre-existing diabetes mellitus, type 2, in the puerperium: Secondary | ICD-10-CM | POA: Diagnosis not present

## 2022-05-03 DIAGNOSIS — L7682 Other postprocedural complications of skin and subcutaneous tissue: Secondary | ICD-10-CM | POA: Insufficient documentation

## 2022-05-03 DIAGNOSIS — Z794 Long term (current) use of insulin: Secondary | ICD-10-CM | POA: Diagnosis not present

## 2022-05-03 DIAGNOSIS — E119 Type 2 diabetes mellitus without complications: Secondary | ICD-10-CM

## 2022-05-03 DIAGNOSIS — I10 Essential (primary) hypertension: Secondary | ICD-10-CM | POA: Diagnosis not present

## 2022-05-03 DIAGNOSIS — E669 Obesity, unspecified: Secondary | ICD-10-CM | POA: Insufficient documentation

## 2022-05-03 DIAGNOSIS — B9561 Methicillin susceptible Staphylococcus aureus infection as the cause of diseases classified elsewhere: Secondary | ICD-10-CM | POA: Diagnosis not present

## 2022-05-03 DIAGNOSIS — Z79899 Other long term (current) drug therapy: Secondary | ICD-10-CM | POA: Insufficient documentation

## 2022-05-03 DIAGNOSIS — Z7984 Long term (current) use of oral hypoglycemic drugs: Secondary | ICD-10-CM | POA: Insufficient documentation

## 2022-05-03 DIAGNOSIS — Z98891 History of uterine scar from previous surgery: Secondary | ICD-10-CM

## 2022-05-03 MED ORDER — CEPHALEXIN 250 MG PO CAPS
250.0000 mg | ORAL_CAPSULE | Freq: Four times a day (QID) | ORAL | 0 refills | Status: DC
  Filled 2022-05-03: qty 28, 7d supply, fill #0

## 2022-05-03 NOTE — MAU Note (Signed)
.  Jane Schultz is a 42 y.o. at Unknown here in MAU reporting: PPC/S 03/30/22. Had and appointment on Monday and Midwife wanted OB to look at it today.  Reports she was supposed to have an appointment with OB.  Today but transport was difficult so they told me to come here. Has some packing in incision still she changes daily. When she pulls out packing there is a little odor and some yellow discharge.  Denies any pain fever or chills.  Onset of complaint: Monday Pain score: 0 Vitals:   05/03/22 1629  BP: (!) 163/91  Pulse: 84  Resp: 18  Temp: 98.7 F (37.1 C)     FHT:n/a Lab orders placed from triage:

## 2022-05-03 NOTE — MAU Note (Signed)
Wound irrigated by Dr. Ernestina Patches with H2O2 and pcked with 1 inch NUguaze. ABD pad placed on top. Pt to continue daily dressing changes as taught.  Supplies sent home with pt.

## 2022-05-03 NOTE — Telephone Encounter (Signed)
Patient called very upset about her transportation stating that [employer] called her and told her they were outside to pick her up at the [place] location. The pick up location was set up for [facility]. I contacted [employer] where the services for transportation was requested to follow up on location of driver. I spoke with Hinton Dyer who states that [employer] was not picking the patient up and that it was an actual company. She states the pick up time is between 1:10pm and 1:15pm. I was given a number([phone])to the driver(Richard) that was supposed to pick patient up from [place] and bring her to [facility]. I called and spoke with richard who states he will arrive on time to pick up the patient. I then proceeded my call with the interpreter for the patient to inform patient that driver is in fact not at the [place] location and you are still in [facility]. Per interpreter patient states no no no the original pick up time was supposed to be 12:40pm, I dont have time to keep waiting around. Interpreter states that patient gave the middle finger and states that she is not coming to the appointment and disconnected the call.   Phone number to [employer]: [phone] Trip number: [id number]  Patient has been having trouble getting to and from [place] to [place] for appointments.Consulted with Dr Marcelline Mates for an alternative so the patient can receive the care she needs. As advised by Dr Marcelline Mates I informed the patient that she can be seen by a MUA on the floor since patient is already there with her baby in the NICU and the cesarean was actually performed there. Patient states she did not want to "keep bothering them"

## 2022-05-03 NOTE — MAU Provider Note (Signed)
History     CSN: [id number]  Arrival date and time: 05/03/22 1537   Event Date/Time   First Provider Initiated Contact with Patient 05/03/22 1632      Chief Complaint  Patient presents with   Wound Check   Wound Check   Jane Schultz is a 42 y.o. s/p RCS on 03/30/2022 at 27w5dgestation with PMHx notable for cHTN, T2DM, who presents for wound concern.    Patient has been seen multiple times for wound concerns since her surgery on 12/13:  04/05/2022 for drainage, no interventions 04/06/22, seen by primary OB Dr. CMarcelline Matescopious discharge expressed, silver nitrate applied 04/09/22, MAU Visit no signs of infection, would bandaged, [facility] wound packed by  Dr. CMarcelline Mates 04/16/22 , MAU visit due to ongoing drainage, wound again packed and advised on wound care 04/20/22, MAU seen by Dr. EDione Ploverfor wound concern [place] [facility] [place] was concerned about the discharge and planned for patient to be seen today by Dr. CMarcelline Matesbut unfortunately the patient has transportation issues x3 and was unable to get there. The RN triage told her to come to the MAU for assessment.   Visit today conducted with in person [facility] ASL interpreter present   I reviewed the culture from wound culture that showed staph aureus.  She has not had any fever Not sure how the wound is looking as its hard for her to see under pannus Continues to have drainage but has new drainage on the right side of her incision and an odor with changes to the dressing. There is purulent discharge.     OB History     Gravida  2   Para  2   Term  0   Preterm  2   AB      Living  2      SAB      IAB      Ectopic      Multiple  0   Live Births  1           Past Medical History:  Diagnosis Date   Asthma    Deaf    Diabetes mellitus without complication ([place]    Hypertension     Past Surgical History:  Procedure Laterality Date   CESAREAN SECTION      CESAREAN SECTION N/A 03/30/2022   Procedure: CESAREAN SECTION;  Surgeon: EFlorian Buff MD;  Location: [facility] LD ORS;  Service: Obstetrics;  Laterality: N/A;   EXCISION VAGINAL CYST N/A 05/13/2021   Procedure: Incision and Drainage;  Surgeon: CRubie Maid MD;  Location: [facility] ORS;  Service: Gynecology;  Laterality: N/A;    No family history on file.  Social History   Tobacco Use   Smoking status: Former    Packs/day: 2.00    Types: Cigarettes   Smokeless tobacco: Never  Vaping Use   Vaping Use: Former  Substance Use Topics   Alcohol use: Never   Drug use: Not Currently    Types: Marijuana    Comment: stopped 4 mths ago    Allergies: No Known Allergies  Medications Prior to Admission  Medication Sig Dispense Refill Last Dose   Accu-Chek Softclix Lancets lancets Use As Directed 100 each 0    Blood Glucose Monitoring Suppl (ACCU-CHEK GUIDE) w/Device KIT Use as directed. 1 kit 0    Continuous Blood Gluc Receiver (DEXCOM G6 RECEIVER) DEVI Check blood sugars 4 x daily (fasting and 2  hours postprandial) 1 each 0    Continuous Blood Gluc Sensor (DEXCOM G6 SENSOR) MISC Check blood sugars 4 x daily (fasting and 2 hours postprandial) 3 each 6    Continuous Blood Gluc Transmit (DEXCOM G6 TRANSMITTER) MISC Check blood sugars 4 x daily (fasting and 2 hours postprandial) 1 each 0    cyclobenzaprine (FLEXERIL) 10 MG tablet Take 1 tablet (10 mg total) by mouth 3 (three) times daily as needed for muscle spasms. 30 tablet 0    furosemide (LASIX) 40 MG tablet Take 1 tablet (40 mg total) by mouth 2 (two) times daily for 7 days. 14 tablet 0    glucose blood (ACCU-CHEK GUIDE) test strip Use to check blood sugars as instructed 200 each 12    hydrochlorothiazide (HYDRODIURIL) 25 MG tablet Take 1 tablet (25 mg total) by mouth daily. 30 tablet 0    ibuprofen (ADVIL) 600 MG tablet Take 1 tablet (600 mg total) by mouth every 8 (eight) hours as needed for moderate pain or cramping. 30 tablet 0    insulin  glargine (LANTUS) 100 unit/mL SOPN Inject 10 Units into the skin daily. 3 mL 0    Insulin Pen Needle (BD PEN NEEDLE NANO 2ND GEN) 32G X 4 MM MISC Use to inject insulin. 30 each 1    levonorgestrel (MIRENA) 20 MCG/DAY IUD 1 each by Intrauterine route once for 1 dose. 1 each 0    lisinopril (ZESTRIL) 10 MG tablet Take 1 tablet (10 mg total) by mouth daily. (Patient not taking: Reported on 04/21/2022) 30 tablet 0    metFORMIN (GLUCOPHAGE) 1000 MG tablet Take 1 tablet (1,000 mg total) by mouth 2 (two) times daily with a meal. 60 tablet 0    metoprolol tartrate (LOPRESSOR) 50 MG tablet Take 1 tablet (50 mg total) by mouth daily. 30 tablet 0    metoprolol-hydrochlorothiazide (LOPRESSOR HCT) 50-25 MG tablet Take 1 tablet by mouth daily. (Patient not taking: Reported on 04/21/2022) 30 tablet 0    NIFEdipine (ADALAT CC) 60 MG 24 hr tablet Take 1 tablet (60 mg total) by mouth 2 (two) times daily. 60 tablet 0    oxyCODONE (OXY IR/ROXICODONE) 5 MG immediate release tablet Take 1-2 tablets (5-10 mg total) by mouth every 4 (four) hours as needed for moderate pain. (Patient not taking: Reported on 04/21/2022) 30 tablet 0    Prenatal MV & Min w/FA-DHA (PRENATAL ADULT GUMMY/DHA/FA) 0.4-25 MG CHEW Chew 1 each by mouth daily. (Patient not taking: Reported on 04/21/2022) 30 tablet 3     Review of Systems  Constitutional:  Negative for chills and fever.  HENT:  Negative for congestion and sore throat.   Eyes:  Negative for pain and visual disturbance.  Respiratory:  Negative for cough, chest tightness and shortness of breath.   Cardiovascular:  Negative for chest pain.  Gastrointestinal:  Negative for abdominal pain, diarrhea, nausea and vomiting.  Endocrine: Negative for cold intolerance and heat intolerance.  Genitourinary:  Negative for dysuria and flank pain.  Musculoskeletal:  Negative for back pain.  Skin:  Negative for rash.  Allergic/Immunologic: Negative for food allergies.  Neurological:  Negative for dizziness  and light-headedness.  Psychiatric/Behavioral:  Negative for agitation.    Physical Exam   Blood pressure (!) 163/91, pulse 84, temperature 98.7 F (37.1 C), resp. rate 18, height '5\' 4"'$  (1.626 m), weight 104.8 kg, currently breastfeeding.  Physical Exam Vitals and nursing note reviewed.  Constitutional:      General: She is not in acute  distress.    Appearance: She is well-developed.  HENT:     Head: Normocephalic and atraumatic.  Eyes:     General: No scleral icterus.    Conjunctiva/sclera: Conjunctivae normal.  Cardiovascular:     Rate and Rhythm: Normal rate.  Pulmonary:     Effort: Pulmonary effort is normal.  Chest:     Chest wall: No tenderness.  Abdominal:     Palpations: Abdomen is soft.     Tenderness: There is no abdominal tenderness. There is no guarding or rebound.     Comments: Incision is wet with purulent discharge on the right lateral edge which is new. 1cm defect midline with packing in place which I removed-- probed wound which is clearly above the fascia and is about a 1cm cavity, there is no purulent drainage from this site.  No surrounding erythema on the skin.   Genitourinary:    Vagina: Normal.  Musculoskeletal:        General: Normal range of motion.     Cervical back: Normal range of motion and neck supple.  Skin:    General: Skin is warm and dry.     Findings: No rash.  Neurological:     Mental Status: She is alert and oriented to person, place, and time.         MAU Course  Procedures Wound cleaning-- I flushed the open area with copious hydrogen peroxide, replaced the packing and dressing.   MDM: moderate  MAU Course: - Wound cleaning   After the interventions noted above, I reevaluated the patient and found that they have :improved  Dispostion: discharged   Assessment and Plan   Wound infection Prolonged wound healing due to diabetes and obesity  Sent in prescription for Keflex to cover skin flora given now purulent  discharge  Recommended daily packing to open defect that is midline with saturation of the packing with peroxide with dry dressing above. Recommend everyday wet to dry dressing.  Encouraged outpatient follow up, patient reports she is living at the hospital Recommended RN check in 1 week-- appointment made.   Future Appointments  Date Time Provider [place]  05/05/2022  2:00 PM [facility] NURSE [facility]  05/12/2022  3:00 PM [facility]  06/03/2022  2:00 PM Kate Sable, MD [facility] None     Juanita Craver [facility] 05/03/2022, 5:26 PM

## 2022-05-04 ENCOUNTER — Other Ambulatory Visit (HOSPITAL_COMMUNITY): Payer: Self-pay

## 2022-05-05 ENCOUNTER — Ambulatory Visit (INDEPENDENT_AMBULATORY_CARE_PROVIDER_SITE_OTHER): Payer: Medicaid Other | Admitting: Obstetrics and Gynecology

## 2022-05-05 ENCOUNTER — Other Ambulatory Visit (HOSPITAL_COMMUNITY): Payer: Self-pay

## 2022-05-05 ENCOUNTER — Encounter: Payer: Self-pay | Admitting: Obstetrics and Gynecology

## 2022-05-05 VITALS — BP 155/78 | HR 84 | Ht 64.0 in | Wt 230.0 lb

## 2022-05-05 DIAGNOSIS — T8131XD Disruption of external operation (surgical) wound, not elsewhere classified, subsequent encounter: Secondary | ICD-10-CM | POA: Diagnosis not present

## 2022-05-05 MED ORDER — CEPHALEXIN 250 MG PO CAPS: 250.0000 mg | ORAL_CAPSULE | Freq: Four times a day (QID) | ORAL | 0 refills | Status: AC

## 2022-05-05 MED ORDER — METOPROLOL TARTRATE 50 MG PO TABS
50.0000 mg | ORAL_TABLET | Freq: Every day | ORAL | 0 refills | Status: DC
  Filled 2022-05-05: qty 90, 90d supply, fill #0

## 2022-05-05 MED ORDER — HYDROCHLOROTHIAZIDE 25 MG PO TABS
25.0000 mg | ORAL_TABLET | Freq: Every day | ORAL | 0 refills | Status: DC
  Filled 2022-05-05: qty 90, 90d supply, fill #0

## 2022-05-05 MED ORDER — CEPHALEXIN 250 MG PO CAPS
250.0000 mg | ORAL_CAPSULE | Freq: Four times a day (QID) | ORAL | 0 refills | Status: DC
  Filled 2022-05-05: qty 28, 7d supply, fill #0

## 2022-05-05 NOTE — Progress Notes (Signed)
  CC: wound check/ wound breakdown Subjective:    Patient ID: Jane Schultz, female    DOB: 11/14/1980, 41 y.o.   MRN: [id number]  HPI PT returns for wound check along with sign language interpreter.  She denies any fever or chills.  She is tolerating regular diet.  She still reports some drainage and pus.   Review of Systems     Objective:   Physical Exam Vitals:   05/05/22 1402  BP: (!) 155/78  Pulse: 84   Wound clean dry  and intact 2 small defects on the patient's right side less than a mm deep.  Central opening had gauze pad which was removed and replaced with peroxide soaked kerlex.  Defect about 3/4 cm deep and healing well.  Minimal drainage, only some serous  draining which was light.  ABD pad placed.  Pt advised to continue daily wet to dry wound changes.      Assessment & Plan:   1. Dehiscence of operative wound, subsequent encounter Paper rx for keflex given as pt never picked up original rx. F/u in 2 weeks as wound is healing well. - cephALEXin (KEFLEX) 250 MG capsule; Take 1 capsule (250 mg total) by mouth 4 (four) times daily for 7 days.  Dispense: 28 capsule; Refill: 0    Griffin Basil, MD Faculty Attending, [facility] for [facility]

## 2022-05-06 ENCOUNTER — Other Ambulatory Visit (HOSPITAL_COMMUNITY): Payer: Self-pay

## 2022-05-06 ENCOUNTER — Other Ambulatory Visit: Payer: Self-pay

## 2022-05-06 ENCOUNTER — Ambulatory Visit: Payer: Self-pay

## 2022-05-06 LAB — ANAEROBIC AND AEROBIC CULTURE

## 2022-05-06 NOTE — Lactation Note (Signed)
This note was copied from a baby's chart.  NICU Lactation Consultation Note  Patient Name: Jane Schultz Today's Date: 05/06/2022 Age:41 wk.o.   Subjective Reason for consult: Follow-up assessment; Primapara; 1st time breastfeeding; NICU baby; Preterm <34wks; Late-preterm 34-36.6wks; Infant < 6lbs  Lactation visited with Jane Schultz and infant Jane Schultz two times day:  11:00 - I followed up and discussed her feeding goals and her milk volume. Jane Schultz shared that she has been modifying her diet (lowering her sugar intake and increasing fluids) to help with her milk supply. She wants to increase her milk; a provider discussed with her the possibility of introducing formula due to infant's age. She would prefer not to give formula, but she states that she is learning to let go of some of these things.  I reviewed lactogenic foods and habits that increase milk production. I provided her with a manual pump to use when she's at her appointments. I encouraged her to try to pump consistently for 4-5 days to help with her milk production.   1210 - I return to assist with lick and learn. I placed Jane Schultz STS on Jane Schultz's chest. She showed PO readiness cues such as rooting and licking. I showed Jane Schultz how to hold infant at the breast in cradle hold and in football hold and how to hand express. Infant roots, nuzzles, licks and attempts to latch. She has difficulty holding the nipple in her mouth beyond a few suckles.  I encouraged Jane Schultz to continue with STS, lick and learn, and to post-pump after feeding attempts.  Lactation follow up recommended. We discussed possibility of using a nipple shield; however as this was the first attempt, we focused on a few skills first.  Objective Infant data: Mother's Current Feeding Choice: Breast Milk and Donor Milk  Infant feeding assessment Scale for Readiness: 2  Maternal data: G2P0202  C-Section, Low Transverse Current breast feeding  challenges:: preterm; NICU  Does the patient have breastfeeding experience prior to this delivery?: No  Pumping frequency: inconsistent; recommend pumping q3 hours; last pumped today at 0600 Pumped volume: 20 mL  Risk factor for low milk supply:: inconsistent pumping and some medications that are known to decrease milk volume   WIC Program: Yes WIC Referral Sent?: Yes Pump:  (WIC called her on 04/21/2022 but she hasn't picked up her pump yet, she's using the one in baby's room)  Assessment Infant: LATCH Score: 6  Intervention/Plan Interventions: Breast feeding basics reviewed; Assisted with latch; Skin to skin; Pre-pump if needed; Hand express; Adjust position; Support pillows; DEBP; Education  Pump Education: Setup, frequency, and cleaning  Plan: Consult Status: NICU follow-up  NICU Follow-up type: Weekly NICU follow up    Lenore Manner 05/06/2022, 2:54 PM

## 2022-05-10 ENCOUNTER — Ambulatory Visit: Payer: Self-pay

## 2022-05-10 NOTE — Lactation Note (Addendum)
This note was copied from a baby's chart.  NICU Lactation Consultation Note  Patient Name: Jane Schultz Today's Date: 05/10/2022 Age:41 wk.o.   Subjective Reason for consult: 1st time breastfeeding; Late-preterm 34-36.6wks; Infant < 6lbs; Maternal endocrine disorder; NICU baby  Lactation visited with [place] and baby Jane Schultz for a feeding assist and assessment. In-person ASL interpreter present to assist.   LC provided a hand's free pumping band and demonstrated how to use this.  Mom is rooming-in and has been using the [employer] Symphony pump.    Objective Infant feeding assessment Scale for Readiness: 2   Maternal data: G2P0202  C-Section, Low Transverse Pumping frequency: Increased frequency of pumping Flange Size: 24  WIC Program: Yes WIC Referral Sent?: Yes Pump:  (WIC called her on 04/21/2022 but she hasn't picked up her pump yet, she's using the one in baby's room)  Assessment Baby showing PO readiness while in crib. Mom currently pumping 5 mins and LC assisted Mom to discontinue pumping. Baby placed in cross cradle hold assisting Mom to support her breast in a U hold and support baby's head from ear to ear.  Baby opening her mouth to nipple.  Milk drops expressed on nipple.  Baby's mouth is small and baby is having a difficult time latching onto more than the nipple tip, even with LC sandwiching areola.  LC initiated a 20 mm nipple shield, showing Jane Schultz how to invert prior to placement and nipple pulled well into shield. Assisted with a few attempts to latch before baby opened wide enough to latch to base of nipple shield.  Baby able to sustain the latch for 5 mins.  Baby paced herself and showed no sign of distress.  LC placed a ml of EBM into shield and baby sucked and coughed.  LC took baby off the breast and placed her on Mom's chest.  Infant: LATCH Score: 6  Maternal: Milk volume: Low   Intervention/Plan Interventions: Assisted with latch; Breast  massage; Breast compression; Adjust position; Support pillows; Position options; DEBP; Hand express  Tools: Nipple Shields; Hands-free pumping top; Pump; Flanges Nipple shield size: 20  Plan: Consult Status: NICU follow-up  NICU Follow-up type: Weekly NICU follow up; Assist with IDF-2 (Mother does not need to pre-pump before breastfeeding)    Jane Schultz 05/10/2022, 12:59 PM

## 2022-05-12 ENCOUNTER — Ambulatory Visit: Payer: Medicaid Other | Admitting: Family Medicine

## 2022-05-12 ENCOUNTER — Ambulatory Visit: Payer: Self-pay

## 2022-05-12 NOTE — Lactation Note (Signed)
This note was copied from a baby's chart.  NICU Lactation Consultation Note  Patient Name: Jane Schultz Today's Date: 05/12/2022 Age:41 wk.o.   Subjective Reason for consult: Follow-up assessment; Late-preterm 34-36.6wks; 1st time breastfeeding; NICU baby; Infant < 6lbs; Maternal endocrine disorder  LC in to assist with positioning and latching at 12 noon feeding.  No pre-pumping done.  Milk volume low.  Encouraged pumping after breastfeeding attempts.  Baby unable to latch and sustain with using a 16 mm nipple shield.  (Demonstrated to Mom a few times how to invert NS half way and stretch over her nipple to pull her nipple into shield) Baby wasn't opening her mouth wide enough due to baby's small mouth size at present. Baby latched on shallowly, and did NNS a few times which pleased Mom.  Reassured Mom that continued trying as baby matures and grows is the right thing to do.  Baby came off the breast and smiled a huge smile which made [place] so happy and Mom started to laugh.    Objective Infant feeding assessment Scale for Readiness: 2 Scale for Quality: -- (infant fell asleep)     Maternal data: G2P0202  C-Section, Low Transverse Pumping frequency: Q 3h Pumped volume: 20 mL Flange Size: 21  WIC Program: Yes WIC Referral Sent?: Yes Pump:  (WIC called her on 04/21/2022 but she hasn't picked up her pump yet, she's using the one in baby's room)  Assessment Infant: LATCH Score: 5  Maternal: Milk volume: Low   Intervention/Plan Interventions: Breast feeding basics reviewed; Assisted with latch; Skin to skin; Breast compression; Adjust position; Support pillows; Position options; Expressed milk; Pace feeding; Education; DEBP  Tools: Pump; Flanges; Hands-free pumping top Nipple shield size: 16  Plan: Consult Status: NICU follow-up  NICU Follow-up type: Assist with IDF-2 (Mother does not need to pre-pump before breastfeeding)    Broadus John 05/12/2022, 4:20 PM

## 2022-05-16 ENCOUNTER — Ambulatory Visit: Payer: Self-pay

## 2022-05-16 NOTE — Lactation Note (Signed)
This note was copied from a baby's chart.  NICU Lactation Consultation Note  Patient Name: Jane Schultz Today's Date: 05/16/2022 Age:41 wk.o.   Subjective Reason for consult: Follow-up assessment; NICU baby; Weekly NICU follow-up; Infant < 6lbs  LC visited with P2 Mom of [place] 3w3dinfant.   Mom is focusing on pumping and bottle feeding as baby was getting too frustrated at the breast.   LC encouraged Mom to pump more frequently as she has been only able to pump about 4 times in 24 hrs.   Encouraged STS and explained the benefit of this and her milk volume. Mom denies any questions for LC at present.    Objective Infant data: Infant feeding assessment Scale for Readiness: 1 Scale for Quality: 3     Maternal data: G2P0202  C-Section, Low Transverse Pumping frequency: pumping 4 times per 24 hrs Pumped volume: 20 mL Flange Size: 21   WIC Program: Yes WIC Referral Sent?: Yes Pump:  (WIC called her on 04/21/2022 but she hasn't picked up her pump yet, she's using the one in baby's room)  Maternal: Milk volume: Low   Intervention/Plan Interventions: Skin to skin; Breast massage; Hand express; DEBP  Tools: Pump; Flanges; Bottle; Hands-free pumping top  Plan: Consult Status: NICU follow-up  NICU Follow-up type: Weekly NICU follow up    SBroadus John RN [facility]1/29/2024, 3:57 PM

## 2022-05-18 NOTE — Progress Notes (Unsigned)
[place] Partum Visit Note  Jane Schultz is a 42 y.o. G16P0202 female who presents for a postpartum visit. She is 7 weeks postpartum following a repeat cesarean section.  I have fully reviewed the prenatal and intrapartum course. The delivery was at 28.5 gestational weeks.  Baby is still currently in [facility]. Anesthesia: spinal. Postpartum course has been complicated by wound dehisence. Baby is doing well, still in NICU. Baby is feeding by both breast and bottle - high calorie pharmacy made . Bleeding  states she is having bleeding but relates to IUD . Bowel function is normal. Bladder function is normal. Patient is not sexually active. Contraception method is IUD. Postpartum depression screening: negative.  Is requesting refills on Metformin as well as Nifedipine   The pregnancy intention screening data noted above was reviewed. Potential methods of contraception were discussed. The patient elected to proceed with No data recorded.   Edinburgh Postnatal Depression Scale - 05/19/22 1113       Edinburgh Postnatal Depression Scale:  In the Past 7 Days   I have been able to laugh and see the funny side of things. 0    I have looked forward with enjoyment to things. 0    I have blamed myself unnecessarily when things went wrong. 0    I have been anxious or worried for no good reason. 0    I have felt scared or panicky for no good reason. 0    Things have been getting on top of me. 0    I have been so unhappy that I have had difficulty sleeping. 1    I have felt sad or miserable. 1    I have been so unhappy that I have been crying. 1    The thought of harming myself has occurred to me. 0    Edinburgh Postnatal Depression Scale Total 3             Health Maintenance Due  Topic Date Due   COVID-19 Vaccine (1) Never done   FOOT EXAM  Never done   OPHTHALMOLOGY EXAM  Never done    The following portions of the patient's history were reviewed and updated as appropriate:  allergies, current medications, past family history, past medical history, past social history, past surgical history, and problem list.  Review of Systems Pertinent items noted in HPI and remainder of comprehensive ROS otherwise negative.  Objective:  BP (!) 146/86   Pulse 64    General:  alert, cooperative, and appears stated age   Breasts:  not indicated  Lungs: Comfortalbe on room air  Wound Small <1 cm area of dehisence with some packing in it  GU exam:   Normal nulliparous cervix, no IUD strings seen         Assessment:    There are no diagnoses linked to this encounter.  Normal postpartum exam.   Plan:   Essential components of care per ACOG recommendations:  1.  Mood and well being: Patient with negative depression screening today. Reviewed local resources for support.  - Patient tobacco use? No.   - hx of drug use? No.    2. Infant care and feeding:  -Patient currently breastmilk feeding? Yes. Reviewed importance of draining breast regularly to support lactation.  -Social determinants of health (SDOH) reviewed in EPIC. No concerns  3. Sexuality, contraception and birth spacing - Patient does not want a pregnancy in the next year.  Desired family size is 2 children.  -  Reviewed reproductive life planning. Reviewed contraceptive methods based on pt preferences and effectiveness.  Patient had IUD placed at time of cesarean.   - unable to visualize IUD strings, attempted to tease them out with cytobrush but no luck. Will send for TVUS to evaluate  4. Sleep and fatigue -Encouraged family/partner/community support of 4 hrs of uninterrupted sleep to help with mood and fatigue  5. Physical Recovery  - Discussed patients delivery and complications. She describes her labor as bad. - Patient had a C-section repeat; no problems after delivery but did have multiple issues with wound dehiscence. Still small area today but extremely shallow, recommended she discontinue packing.   - Patient has urinary incontinence? No. - Patient is safe to resume physical and sexual activity  6.  Health Maintenance - HM due items addressed Yes - Last pap smear  Diagnosis  Date Value Ref Range Status  01/04/2022   Final   - Negative for intraepithelial lesion or malignancy (NILM)   Pap smear not done at today's visit.  -Breast Cancer screening indicated? Yes. Patient referred today for mammogram.   7. Chronic Disease/Pregnancy Condition follow up: Hypertension and DM2 - refills sent for all meds as she was out of multiple ones - PCP follow up  Clarnce Flock, MD/MPH Attending Family Medicine Physician, [facility] [place]

## 2022-05-19 ENCOUNTER — Ambulatory Visit (INDEPENDENT_AMBULATORY_CARE_PROVIDER_SITE_OTHER): Payer: Medicaid Other | Admitting: Family Medicine

## 2022-05-19 ENCOUNTER — Encounter: Payer: Self-pay | Admitting: Family Medicine

## 2022-05-19 ENCOUNTER — Other Ambulatory Visit (HOSPITAL_COMMUNITY): Payer: Self-pay

## 2022-05-19 ENCOUNTER — Other Ambulatory Visit: Payer: Self-pay

## 2022-05-19 DIAGNOSIS — I10 Essential (primary) hypertension: Secondary | ICD-10-CM

## 2022-05-19 DIAGNOSIS — Z975 Presence of (intrauterine) contraceptive device: Secondary | ICD-10-CM

## 2022-05-19 DIAGNOSIS — Z794 Long term (current) use of insulin: Secondary | ICD-10-CM

## 2022-05-19 DIAGNOSIS — E119 Type 2 diabetes mellitus without complications: Secondary | ICD-10-CM | POA: Diagnosis not present

## 2022-05-19 DIAGNOSIS — Z30431 Encounter for routine checking of intrauterine contraceptive device: Secondary | ICD-10-CM

## 2022-05-19 DIAGNOSIS — Z30430 Encounter for insertion of intrauterine contraceptive device: Secondary | ICD-10-CM

## 2022-05-19 DIAGNOSIS — H9193 Unspecified hearing loss, bilateral: Secondary | ICD-10-CM

## 2022-05-19 DIAGNOSIS — Z98891 History of uterine scar from previous surgery: Secondary | ICD-10-CM

## 2022-05-19 MED ORDER — BD PEN NEEDLE NANO 2ND GEN 32G X 4 MM MISC
10.0000 [IU] | Freq: Every day | 11 refills | Status: AC
  Filled 2022-05-19: qty 100, 34d supply, fill #0

## 2022-05-19 MED ORDER — HYDROCHLOROTHIAZIDE 25 MG PO TABS
25.0000 mg | ORAL_TABLET | Freq: Every day | ORAL | 3 refills | Status: DC
  Filled 2022-05-19: qty 90, 90d supply, fill #0

## 2022-05-19 MED ORDER — METFORMIN HCL 1000 MG PO TABS
1000.0000 mg | ORAL_TABLET | Freq: Two times a day (BID) | ORAL | 11 refills | Status: AC
  Filled 2022-05-19: qty 60, 30d supply, fill #0

## 2022-05-19 MED ORDER — NIFEDIPINE ER 60 MG PO TB24
60.0000 mg | ORAL_TABLET | Freq: Two times a day (BID) | ORAL | 5 refills | Status: AC
  Filled 2022-05-19 (×2): qty 60, 30d supply, fill #0

## 2022-05-19 MED ORDER — INSULIN GLARGINE 100 UNITS/ML SOLOSTAR PEN
10.0000 [IU] | PEN_INJECTOR | Freq: Every day | SUBCUTANEOUS | 11 refills | Status: AC
  Filled 2022-05-19: qty 3, 30d supply, fill #0

## 2022-05-19 MED ORDER — METOPROLOL TARTRATE 50 MG PO TABS
50.0000 mg | ORAL_TABLET | Freq: Every day | ORAL | 3 refills | Status: AC
  Filled 2022-05-19: qty 90, 90d supply, fill #0

## 2022-05-20 ENCOUNTER — Other Ambulatory Visit (HOSPITAL_COMMUNITY): Payer: Self-pay

## 2022-05-23 ENCOUNTER — Other Ambulatory Visit: Payer: Self-pay | Admitting: Family Medicine

## 2022-05-23 DIAGNOSIS — Z1231 Encounter for screening mammogram for malignant neoplasm of breast: Secondary | ICD-10-CM

## 2022-05-23 NOTE — BH Specialist Note (Signed)
Pt did not arrive to video visit and did not answer the phone; Left HIPPA-compliant message to call back Jamira Barfuss from [facility] at  [phone] (Eldean Nanna's office).  ?; left MyChart message for patient.  ? ?

## 2022-05-24 ENCOUNTER — Ambulatory Visit (HOSPITAL_BASED_OUTPATIENT_CLINIC_OR_DEPARTMENT_OTHER): Admission: RE | Admit: 2022-05-24 | Payer: Medicaid Other | Source: Ambulatory Visit

## 2022-06-02 ENCOUNTER — Ambulatory Visit: Payer: Medicaid Other | Admitting: Clinical

## 2022-06-02 DIAGNOSIS — Z91199 Patient's noncompliance with other medical treatment and regimen due to unspecified reason: Secondary | ICD-10-CM

## 2022-06-03 ENCOUNTER — Ambulatory Visit: Payer: Medicaid Other | Admitting: Cardiology

## 2022-06-09 ENCOUNTER — Other Ambulatory Visit: Payer: Self-pay | Admitting: Certified Nurse Midwife

## 2022-06-10 MED ORDER — DEXCOM G6 RECEIVER DEVI: 0 refills | Status: AC

## 2022-06-10 MED ORDER — DEXCOM G6 TRANSMITTER MISC: 0 refills | Status: AC

## 2022-06-22 DIAGNOSIS — Z1231 Encounter for screening mammogram for malignant neoplasm of breast: Secondary | ICD-10-CM

## 2022-07-06 ENCOUNTER — Ambulatory Visit: Payer: Medicaid Other | Admitting: Cardiology

## 2022-07-08 NOTE — Progress Notes (Unsigned)
   OBSTETRICS POSTPARTUM CLINIC PROGRESS NOTE  Subjective:     Jane Schultz is a 42 y.o. G32P0202 female who presents for a postpartum visit. She is 6 week postpartum following a {delivery:12449}. I have fully reviewed the prenatal and intrapartum course. The delivery was at *** gestational weeks.  Anesthesia: {anesthesia types:812}. Postpartum course has been ***. Baby's course has been ***. Baby is feeding by {breast/bottle:69}. Bleeding: patient {HAS HAS CG:8705835 not resumed menses, with No LMP recorded.. Bowel function is {normal:32111}. Bladder function is {normal:32111}. Patient {is/is not:9024} sexually active. Contraception method desired is {contraceptive method:5051}. Postpartum depression screening: {neg default:13464::"negative"}.  EDPS score is ***.    The following portions of the patient's history were reviewed and updated as appropriate: allergies, current medications, past family history, past medical history, past social history, past surgical history, and problem list.  Review of Systems {ros; complete:30496}   Objective:    There were no vitals taken for this visit.  General:  alert and no distress   Breasts:  inspection negative, no nipple discharge or bleeding, no masses or nodularity palpable  Lungs: clear to auscultation bilaterally  Heart:  regular rate and rhythm, S1, S2 normal, no murmur, click, rub or gallop  Abdomen: soft, non-tender; bowel sounds normal; no masses,  no organomegaly.  ***Well healed Pfannenstiel incision   Vulva:  normal  Vagina: normal vagina, no discharge, exudate, lesion, or erythema  Cervix:  no cervical motion tenderness and no lesions  Corpus: normal size, contour, position, consistency, mobility, non-tender  Adnexa:  normal adnexa and no mass, fullness, tenderness  Rectal Exam: Not performed.         Labs:  Lab Results  Component Value Date   HGB 7.8 (L) 03/31/2022     Assessment:   No diagnosis found.   Plan:     1. Contraception: {method:5051} 2. Will check Hgb for h/o postpartum anemia of less than 10.  3. Follow up in: {1-10:13787} {time; units:19136} or as needed.    Chilton Greathouse, [facility]

## 2022-07-12 ENCOUNTER — Other Ambulatory Visit (HOSPITAL_COMMUNITY): Payer: Self-pay

## 2022-07-12 ENCOUNTER — Ambulatory Visit (INDEPENDENT_AMBULATORY_CARE_PROVIDER_SITE_OTHER): Payer: Medicaid Other | Admitting: Obstetrics and Gynecology

## 2022-07-12 ENCOUNTER — Encounter: Payer: Self-pay | Admitting: Obstetrics and Gynecology

## 2022-07-12 VITALS — BP 183/70 | HR 55 | Resp 16 | Ht 64.0 in | Wt 246.7 lb

## 2022-07-12 DIAGNOSIS — E119 Type 2 diabetes mellitus without complications: Secondary | ICD-10-CM

## 2022-07-12 DIAGNOSIS — Z5189 Encounter for other specified aftercare: Secondary | ICD-10-CM

## 2022-07-12 DIAGNOSIS — Z98891 History of uterine scar from previous surgery: Secondary | ICD-10-CM

## 2022-07-12 DIAGNOSIS — Z8751 Personal history of pre-term labor: Secondary | ICD-10-CM

## 2022-07-12 DIAGNOSIS — I10 Essential (primary) hypertension: Secondary | ICD-10-CM

## 2022-07-12 DIAGNOSIS — T8332XD Displacement of intrauterine contraceptive device, subsequent encounter: Secondary | ICD-10-CM

## 2022-07-12 DIAGNOSIS — O10919 Unspecified pre-existing hypertension complicating pregnancy, unspecified trimester: Secondary | ICD-10-CM

## 2022-07-12 DIAGNOSIS — R600 Localized edema: Secondary | ICD-10-CM

## 2022-07-12 MED ORDER — HYDROCHLOROTHIAZIDE 25 MG PO TABS: 25.0000 mg | ORAL_TABLET | Freq: Two times a day (BID) | ORAL | 1 refills | Status: AC

## 2022-07-12 MED ORDER — DEXCOM G6 SENSOR MISC: 6 refills | Status: AC

## 2022-07-13 ENCOUNTER — Encounter: Payer: Self-pay | Admitting: Obstetrics and Gynecology

## 2022-07-18 ENCOUNTER — Telehealth: Payer: Self-pay | Admitting: Lactation Services

## 2022-07-18 NOTE — Telephone Encounter (Signed)
Landon called from [facility] to get information on patients current Insulin dosages.   Returned call to [employer]. Per chart review, patient was given Lantus 10 U daily and Metformin 1000 units BID.   She came into pharmacy today and picked up insulin. Reviewed medications that were prescribed most recently and informed Pharmacist that patient needs to follow up with PCP since no longer pregnant.

## 2022-07-21 ENCOUNTER — Telehealth: Payer: Self-pay

## 2022-07-25 NOTE — Telephone Encounter (Signed)
Called [employer] to ask why the Dexcom G6 transmitter was approved and the receiver and sensor were not; was told they both went thru and did not need a PA at this tmie.

## 2022-07-27 NOTE — Telephone Encounter (Signed)
Per [facility] pt p/I Dexcom G6 back in October 2023 and states it's good for a year.

## 2022-07-29 NOTE — Telephone Encounter (Signed)
This encounter was created in error - please disregard.

## 2022-07-29 NOTE — Telephone Encounter (Signed)
Per [facility] pt p/I Dexcom G6 back in October 2023 and states it's good for a year. 

## 2022-08-03 ENCOUNTER — Inpatient Hospital Stay: Admission: RE | Admit: 2022-08-03 | Payer: Medicaid Other | Source: Ambulatory Visit

## 2022-08-12 ENCOUNTER — Ambulatory Visit: Payer: Medicaid Other | Attending: Cardiovascular Disease | Admitting: Cardiology

## 2022-08-15 ENCOUNTER — Encounter: Payer: Self-pay | Admitting: Cardiology

## 2023-09-17 IMAGING — US US PELVIS LIMITED
1 series · 14 of 25 positions shown · non-contrast
Comparison: None.

CLINICAL DATA: Pain and swelling in the left labia

EXAM:
LIMITED ULTRASOUND OF PELVIS
TECHNIQUE: Limited transabdominal ultrasound examination of the pelvis was
performed.

[Series 1: us pelvis limited (transabdominal only) · 37 acquisitions, 14 frames shown]
[im 1/37]
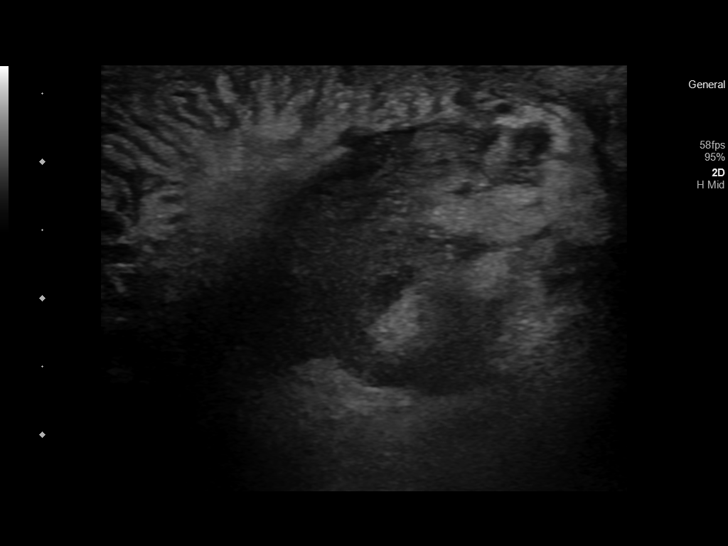
[im 4/37]
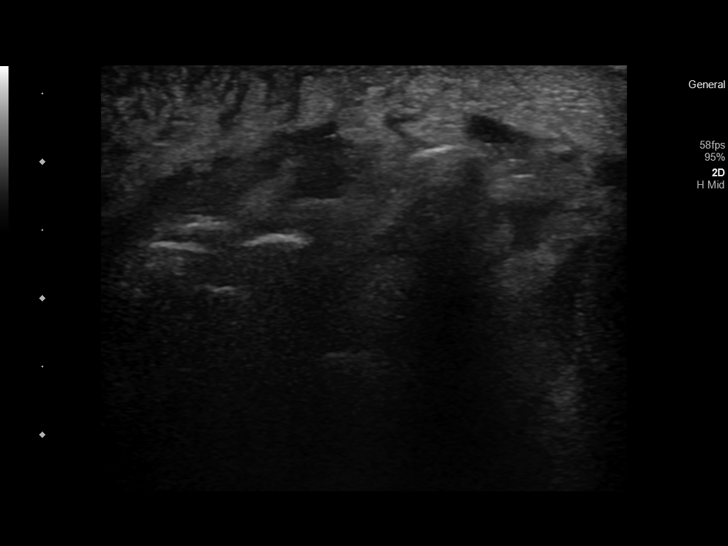
[im 7/37]
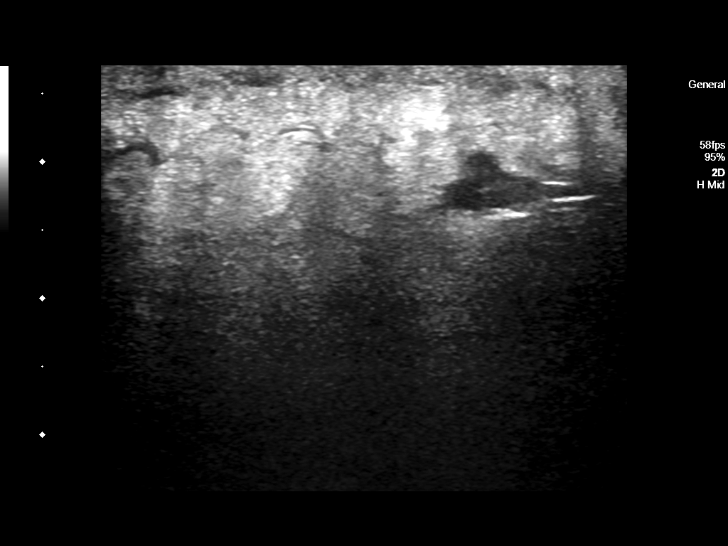
[im 10/37]
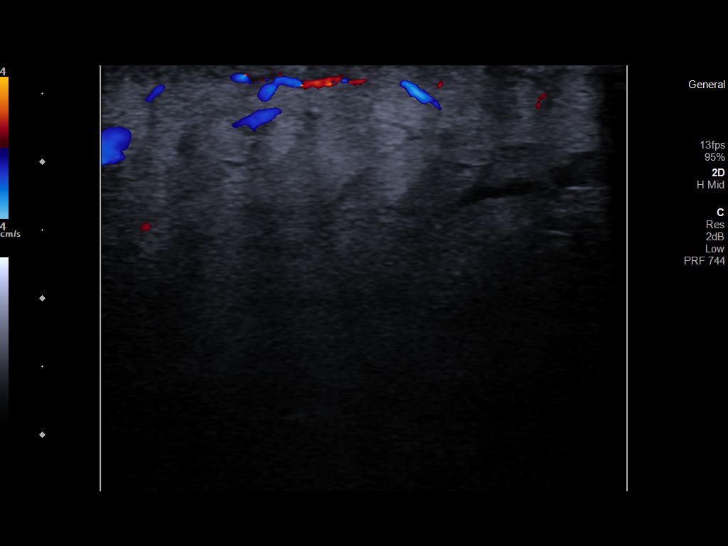
[im 13/37]
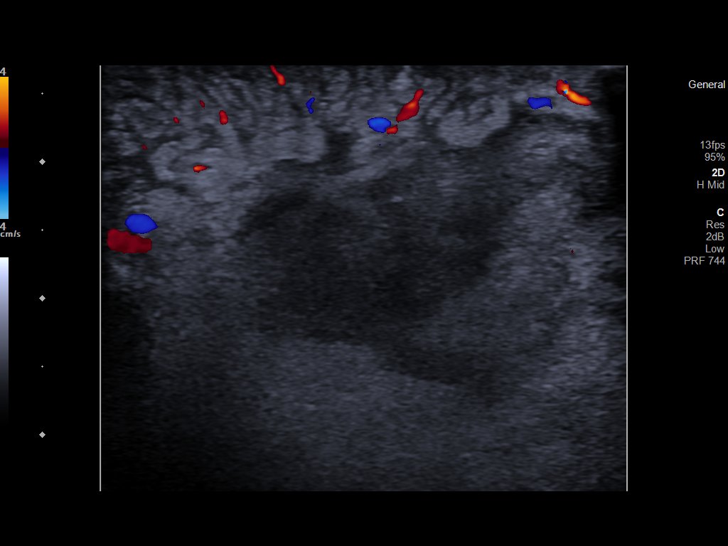
[im 14/37]
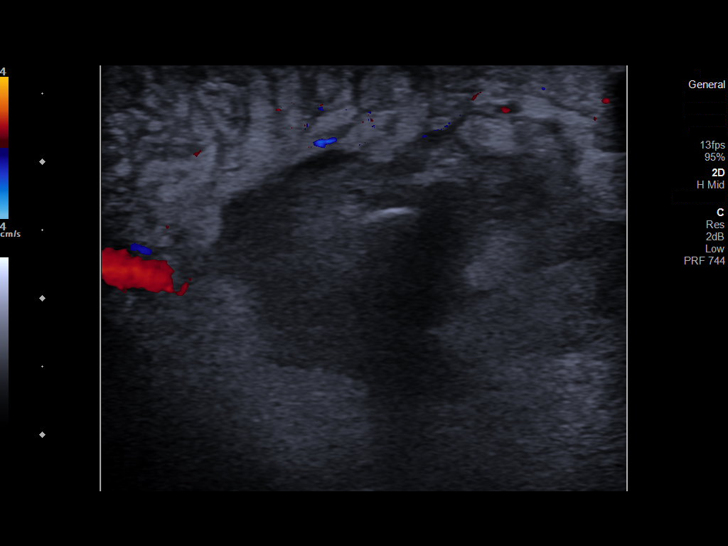
[im 17/37]
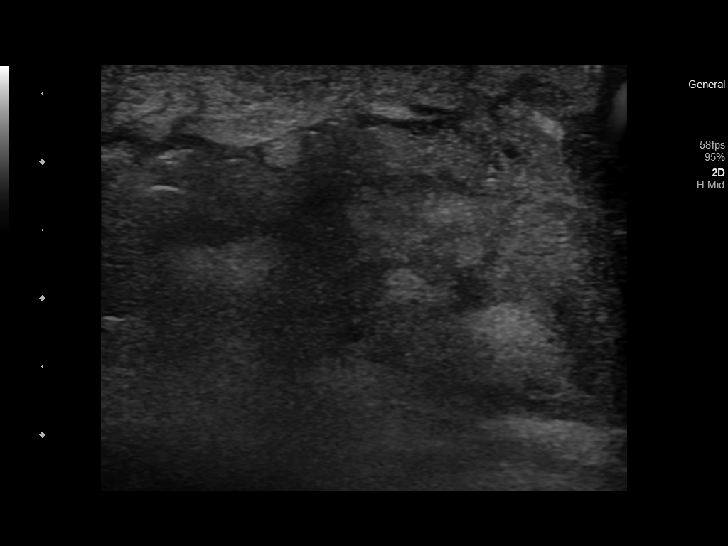
[im 20/37]
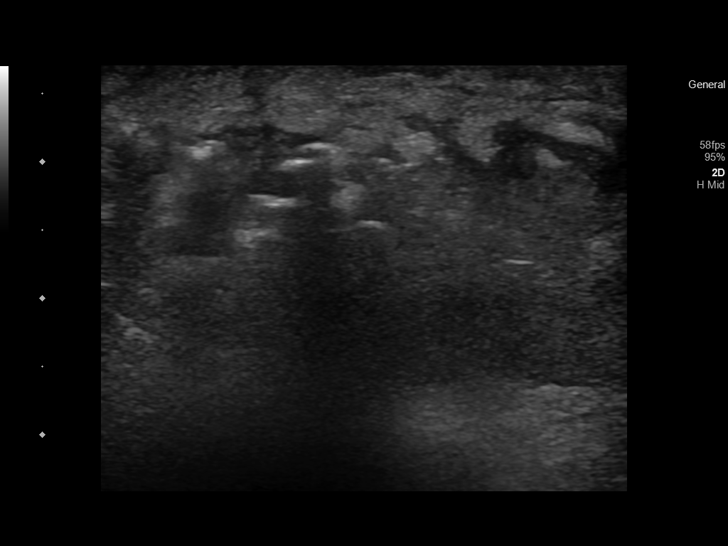
[im 23/37]
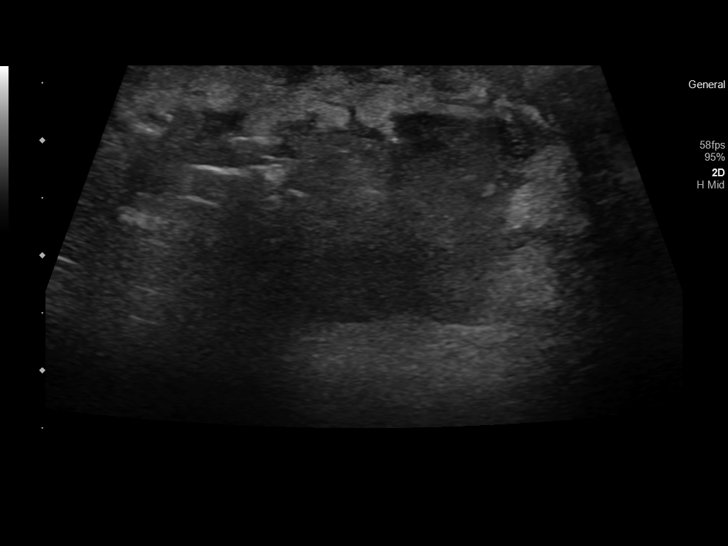
[im 25/37]
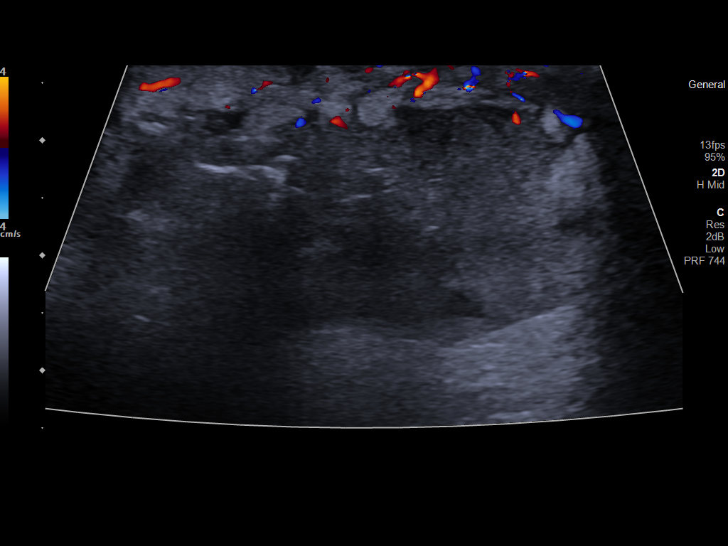
[im 28/37]
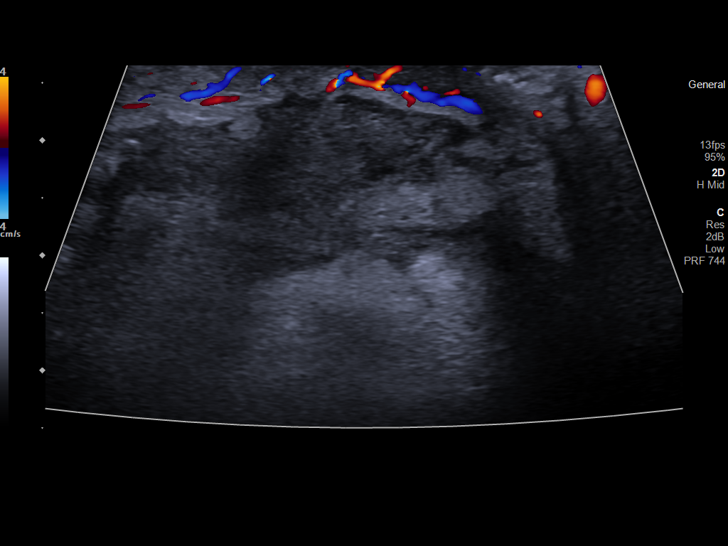
[im 31/37]
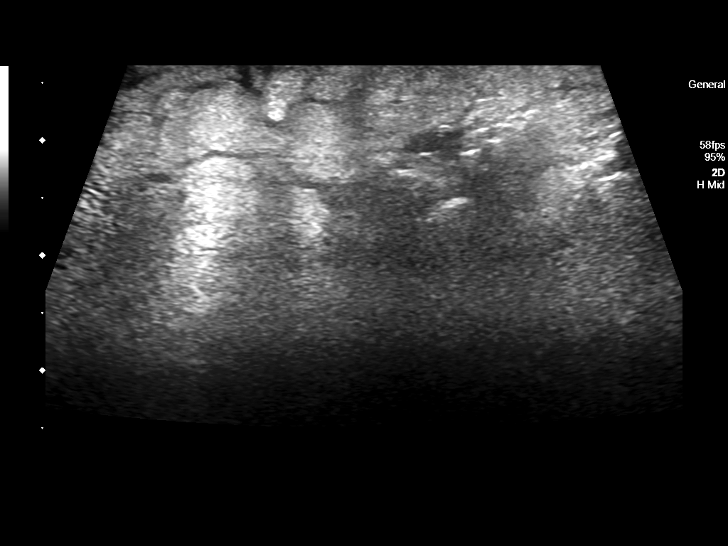
[im 34/37]
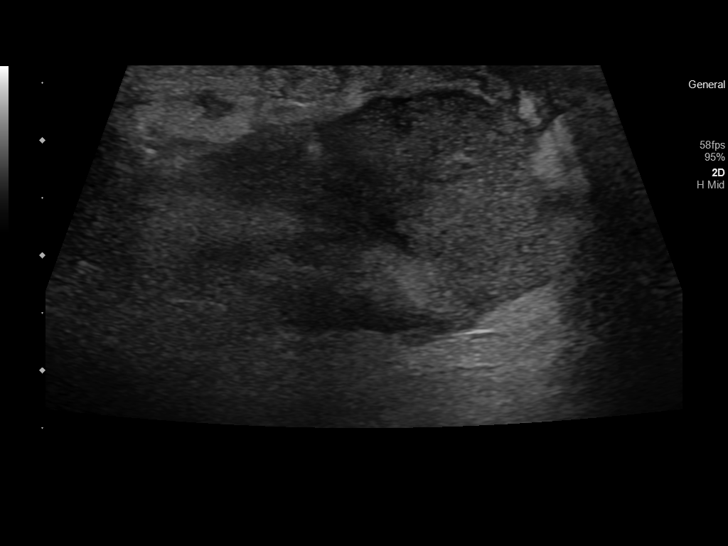
[im 37/37]
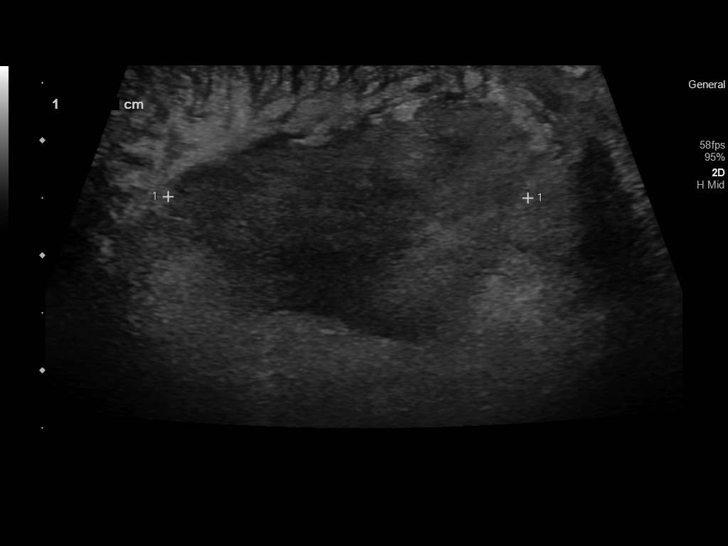

[14 of 25 positions shown; findings below may reference images not displayed]

FINDINGS: There is marked subcutaneous edema and increased vascularity in the
area of patient's symptoms in the left labia. There is 3.5 x 2.3 x
3.1 cm area of inhomogeneous echogenicity without definite
demonstrable internal vascularity approximately 9 mm deep to the
skin in the subcutaneous plane in the area of patient's symptoms.
IMPRESSION: There is marked subcutaneous edema in the left labia suggesting
cellulitis. There is 3.5 x 2.3 x 3.1 cm area of inhomogeneous
echogenicity with slightly indistinct margins in the area of
patient's symptoms in the left labia. This may suggest inflammatory
phlegmon or abscess.

## 2023-09-21 IMAGING — US US RENAL
1 series · 14 of 25 positions shown · non-contrast
Comparison: CT 05/06/2021

CLINICAL DATA: Left flank pain

EXAM:
RENAL / URINARY TRACT ULTRASOUND COMPLETE

[Series 1: us renal · 14 of 29 slices shown]
[im 1/29]
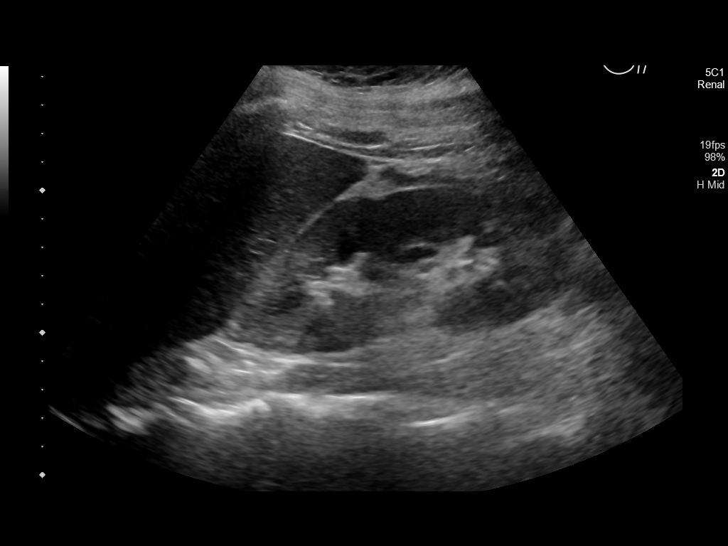
[im 3/29]
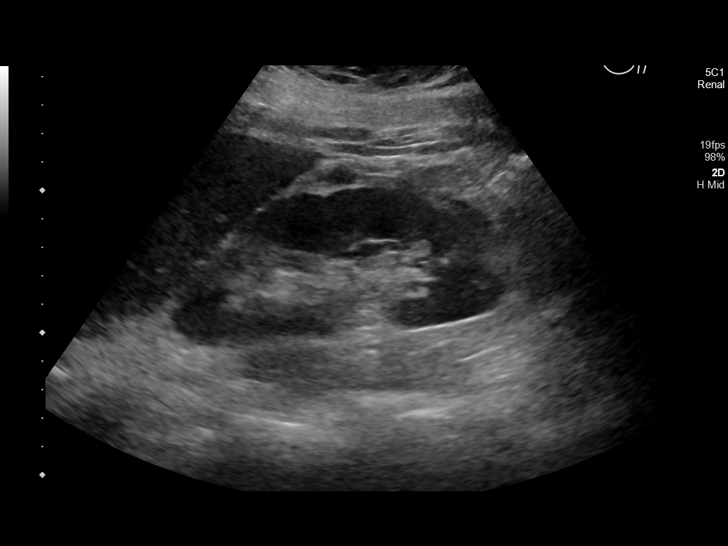
[im 5/29]
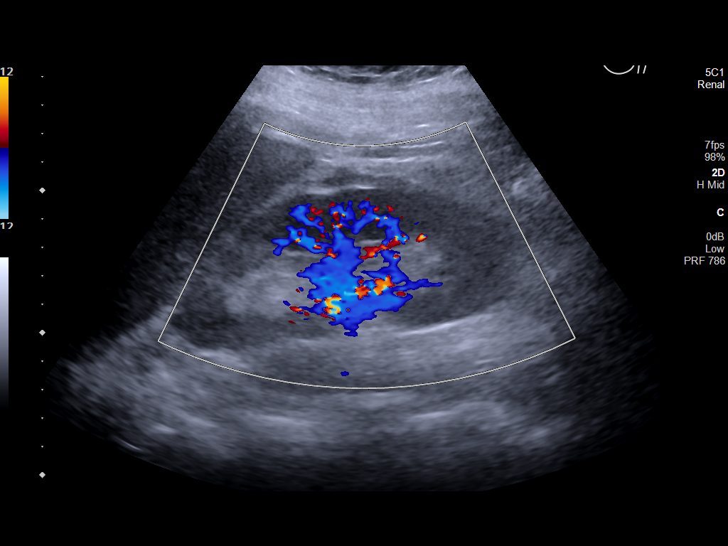
[im 8/29]
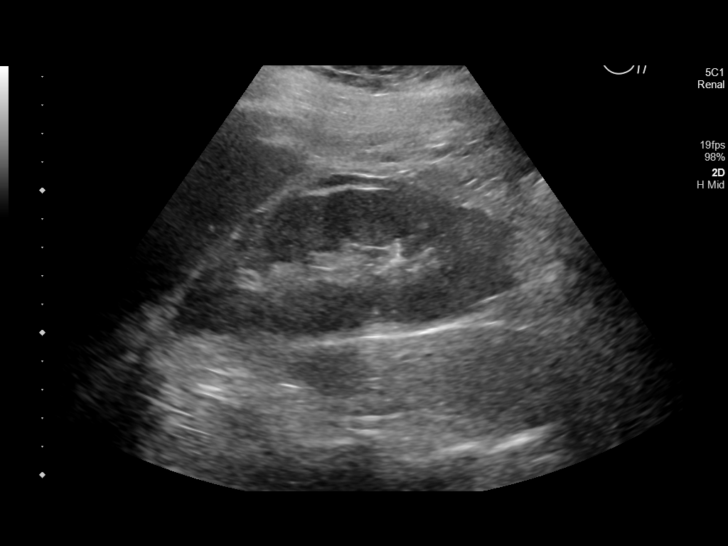
[im 10/29]
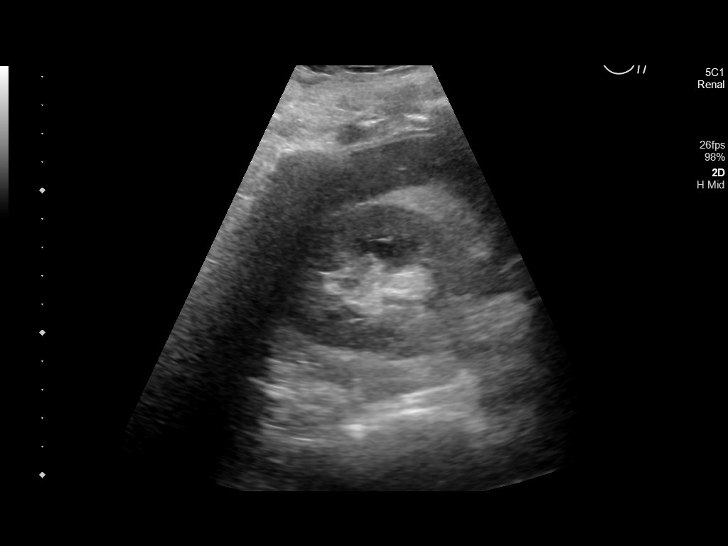
[im 11/29]
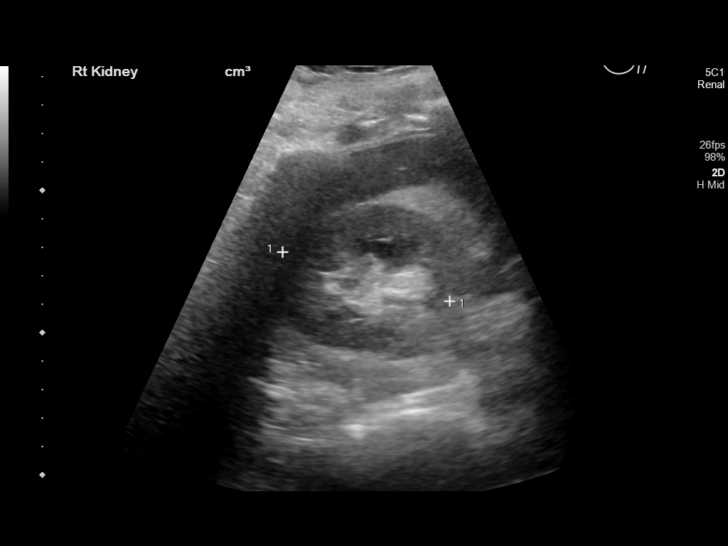
[im 13/29]
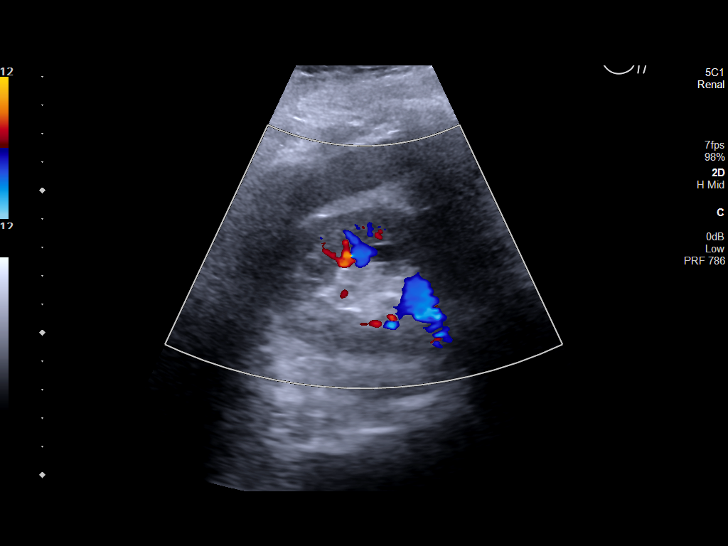
[im 16/29]
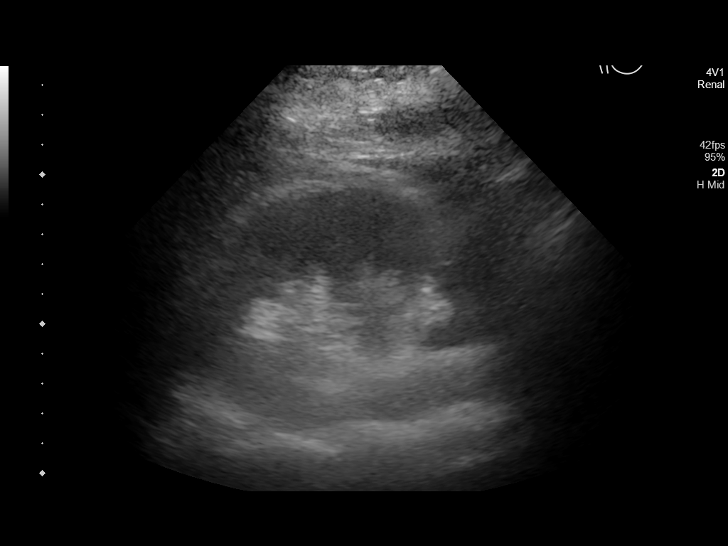
[im 18/29]
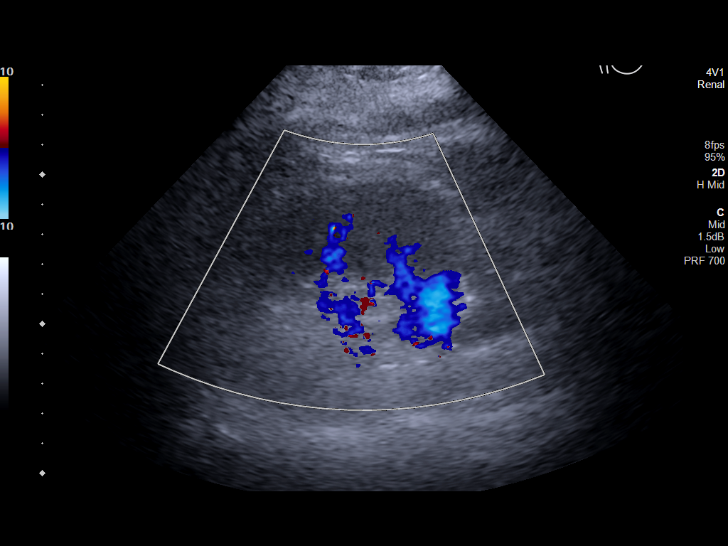
[im 19/29]
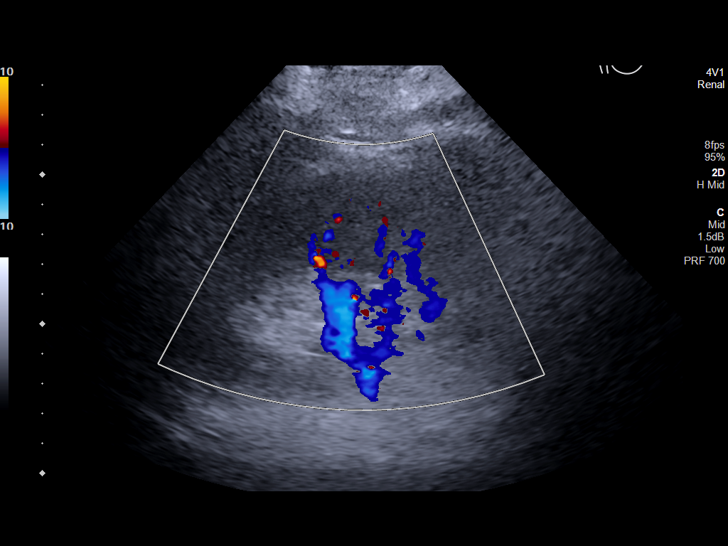
[im 22/29]
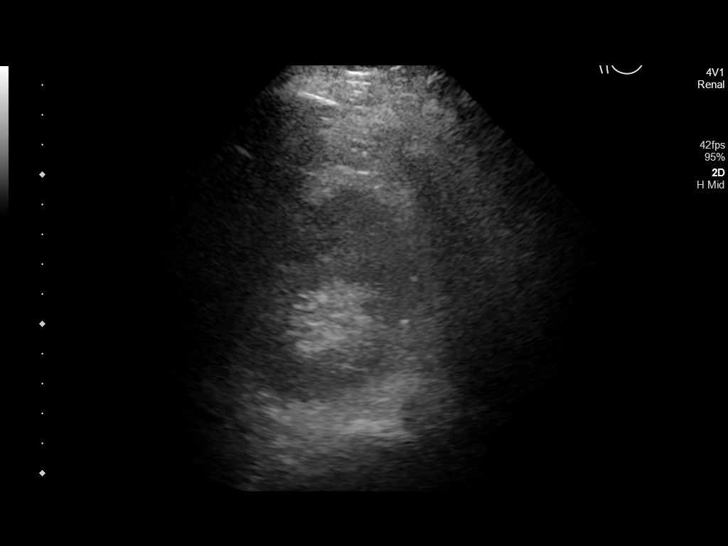
[im 24/29]
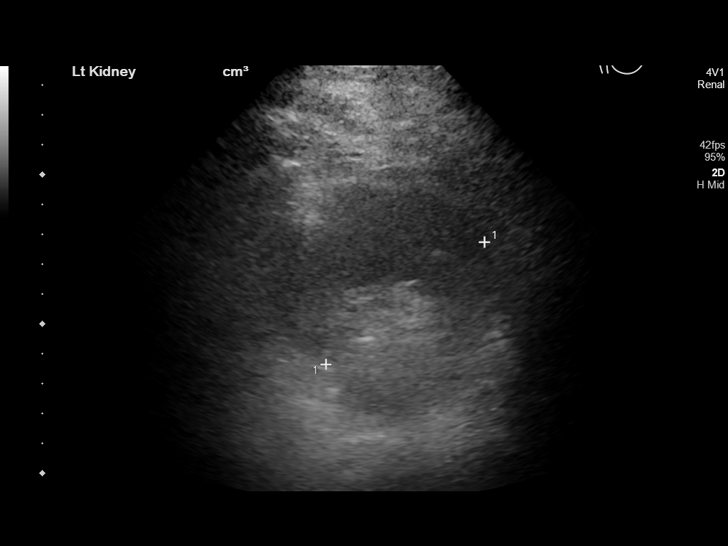
[im 26/29]
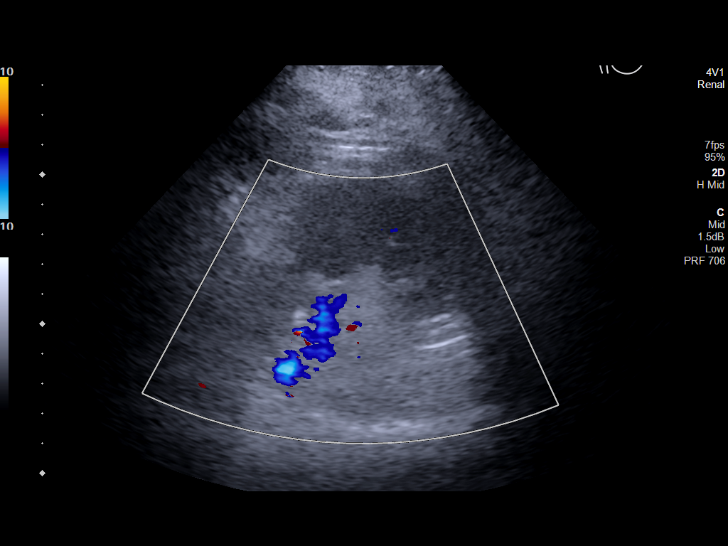
[im 29/29]
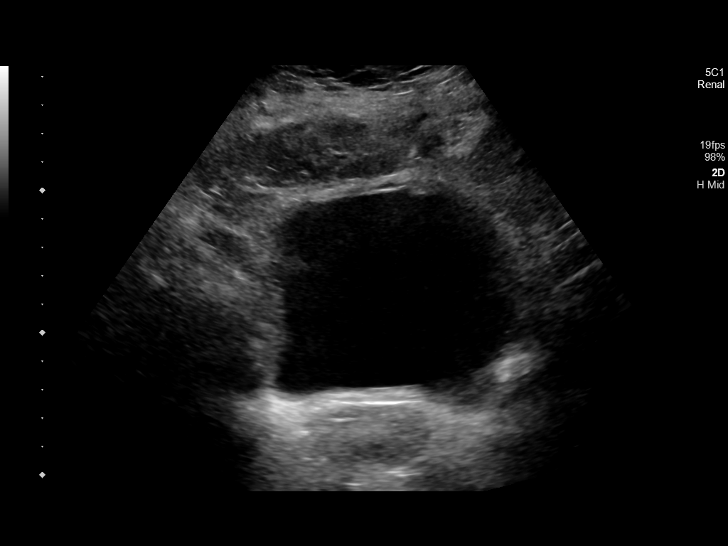

[14 of 25 positions shown; findings below may reference images not displayed]

FINDINGS: Right Kidney:

Renal measurements: 11.9 x 5.2 x 6.1 cm = volume: 197 mL.
Echogenicity within normal limits. No mass or hydronephrosis
visualized.

Left Kidney:

Renal measurements: 11.5 x 5.6 x 6.7 cm = volume: 225 mL.
Echogenicity within normal limits. No mass or hydronephrosis
visualized.

Bladder:

Appears normal for degree of bladder distention.

Other:

None.
IMPRESSION: Negative examination.  No hydronephrosis.
